# Patient Record
Sex: Female | Born: 1945 | Race: White | Hispanic: No | Smoking: Never smoker
Health system: Southern US, Community
[De-identification: ages and names within clinical notes are randomized; demographics above are authoritative.]

## PROBLEM LIST (undated history)

## (undated) DIAGNOSIS — E785 Hyperlipidemia, unspecified: Secondary | ICD-10-CM

## (undated) DIAGNOSIS — K219 Gastro-esophageal reflux disease without esophagitis: Secondary | ICD-10-CM

## (undated) DIAGNOSIS — F419 Anxiety disorder, unspecified: Secondary | ICD-10-CM

## (undated) DIAGNOSIS — R002 Palpitations: Secondary | ICD-10-CM

## (undated) DIAGNOSIS — F329 Major depressive disorder, single episode, unspecified: Secondary | ICD-10-CM

## (undated) DIAGNOSIS — R531 Weakness: Secondary | ICD-10-CM

## (undated) DIAGNOSIS — R7303 Prediabetes: Secondary | ICD-10-CM

## (undated) DIAGNOSIS — F4321 Adjustment disorder with depressed mood: Secondary | ICD-10-CM

## (undated) DIAGNOSIS — F4320 Adjustment disorder, unspecified: Secondary | ICD-10-CM

## (undated) DIAGNOSIS — E039 Hypothyroidism, unspecified: Secondary | ICD-10-CM

## (undated) DIAGNOSIS — Z87442 Personal history of urinary calculi: Secondary | ICD-10-CM

## (undated) DIAGNOSIS — I872 Venous insufficiency (chronic) (peripheral): Secondary | ICD-10-CM

## (undated) DIAGNOSIS — C4492 Squamous cell carcinoma of skin, unspecified: Secondary | ICD-10-CM

## (undated) DIAGNOSIS — R7989 Other specified abnormal findings of blood chemistry: Secondary | ICD-10-CM

## (undated) HISTORY — PX: MASS EXCISION: SHX2000

## (undated) HISTORY — DX: Hyperlipidemia, unspecified: E78.5

## (undated) HISTORY — DX: Prediabetes: R73.03

## (undated) HISTORY — DX: Anxiety disorder, unspecified: F41.9

## (undated) HISTORY — DX: Major depressive disorder, single episode, unspecified: F32.9

## (undated) HISTORY — DX: Palpitations: R00.2

## (undated) HISTORY — DX: Venous insufficiency (chronic) (peripheral): I87.2

## (undated) HISTORY — PX: WISDOM TOOTH EXTRACTION: SHX21

## (undated) HISTORY — PX: COLONOSCOPY: SHX174

## (undated) HISTORY — DX: Weakness: R53.1

## (undated) HISTORY — DX: Adjustment disorder with depressed mood: F43.21

## (undated) HISTORY — DX: Hypothyroidism, unspecified: E03.9

## (undated) HISTORY — DX: Gastro-esophageal reflux disease without esophagitis: K21.9

## (undated) HISTORY — DX: Personal history of urinary calculi: Z87.442

## (undated) HISTORY — DX: Adjustment disorder, unspecified: F43.20

## (undated) HISTORY — DX: Squamous cell carcinoma of skin, unspecified: C44.92

## (undated) HISTORY — DX: Other specified abnormal findings of blood chemistry: R79.89

---

## 1999-12-08 ENCOUNTER — Other Ambulatory Visit: Admission: RE | Admit: 1999-12-08 | Discharge: 1999-12-08 | Payer: Self-pay | Admitting: Obstetrics and Gynecology

## 1999-12-30 ENCOUNTER — Ambulatory Visit (HOSPITAL_COMMUNITY): Admission: RE | Admit: 1999-12-30 | Discharge: 1999-12-30 | Payer: Self-pay | Admitting: Internal Medicine

## 1999-12-30 ENCOUNTER — Encounter: Payer: Self-pay | Admitting: Internal Medicine

## 2000-11-25 ENCOUNTER — Encounter: Payer: Self-pay | Admitting: Emergency Medicine

## 2000-11-25 ENCOUNTER — Emergency Department (HOSPITAL_COMMUNITY): Admission: EM | Admit: 2000-11-25 | Discharge: 2000-11-25 | Payer: Self-pay

## 2000-12-13 ENCOUNTER — Other Ambulatory Visit: Admission: RE | Admit: 2000-12-13 | Discharge: 2000-12-13 | Payer: Self-pay | Admitting: *Deleted

## 2001-12-17 ENCOUNTER — Other Ambulatory Visit: Admission: RE | Admit: 2001-12-17 | Discharge: 2001-12-17 | Payer: Self-pay | Admitting: Obstetrics and Gynecology

## 2002-05-31 ENCOUNTER — Inpatient Hospital Stay (HOSPITAL_COMMUNITY): Admission: AD | Admit: 2002-05-31 | Discharge: 2002-06-03 | Payer: Self-pay | Admitting: Internal Medicine

## 2002-09-12 ENCOUNTER — Ambulatory Visit (HOSPITAL_BASED_OUTPATIENT_CLINIC_OR_DEPARTMENT_OTHER): Admission: RE | Admit: 2002-09-12 | Discharge: 2002-09-12 | Payer: Self-pay | Admitting: General Surgery

## 2002-09-12 ENCOUNTER — Encounter (INDEPENDENT_AMBULATORY_CARE_PROVIDER_SITE_OTHER): Payer: Self-pay | Admitting: *Deleted

## 2003-02-12 ENCOUNTER — Other Ambulatory Visit: Admission: RE | Admit: 2003-02-12 | Discharge: 2003-02-12 | Payer: Self-pay | Admitting: Obstetrics and Gynecology

## 2004-11-19 ENCOUNTER — Ambulatory Visit: Payer: Self-pay | Admitting: Gastroenterology

## 2004-11-26 ENCOUNTER — Ambulatory Visit (HOSPITAL_COMMUNITY): Admission: RE | Admit: 2004-11-26 | Discharge: 2004-11-26 | Payer: Self-pay | Admitting: Gastroenterology

## 2004-12-28 ENCOUNTER — Ambulatory Visit: Payer: Self-pay | Admitting: Pulmonary Disease

## 2006-10-09 ENCOUNTER — Ambulatory Visit: Payer: Self-pay | Admitting: Pulmonary Disease

## 2006-10-11 LAB — CONVERTED CEMR LAB
Basophils Relative: 0.6 % (ref 0.0–1.0)
CO2: 30 meq/L (ref 19–32)
Calcium: 9.7 mg/dL (ref 8.4–10.5)
Chloride: 103 meq/L (ref 96–112)
Cholesterol: 226 mg/dL (ref 0–200)
GFR calc Af Amer: 73 mL/min
Glucose, Bld: 111 mg/dL — ABNORMAL HIGH (ref 70–99)
Hemoglobin: 13.3 g/dL (ref 12.0–15.0)
Lymphocytes Relative: 40.6 % (ref 12.0–46.0)
MCV: 88.2 fL (ref 78.0–100.0)
Monocytes Absolute: 0.5 10*3/uL (ref 0.2–0.7)
Monocytes Relative: 7.6 % (ref 3.0–11.0)
Neutrophils Relative %: 49.4 % (ref 43.0–77.0)
Platelets: 264 10*3/uL (ref 150–400)
TSH: 4.41 microintl units/mL (ref 0.35–5.50)
Total CHOL/HDL Ratio: 4.5
Triglycerides: 157 mg/dL — ABNORMAL HIGH (ref 0–149)
WBC: 6.4 10*3/uL (ref 4.5–10.5)

## 2007-01-09 ENCOUNTER — Ambulatory Visit: Payer: Self-pay | Admitting: Pulmonary Disease

## 2007-01-09 LAB — CONVERTED CEMR LAB
ALT: 37 units/L (ref 0–40)
AST: 30 units/L (ref 0–37)
Albumin: 3.4 g/dL — ABNORMAL LOW (ref 3.5–5.2)
Alkaline Phosphatase: 82 units/L (ref 39–117)
BUN: 15 mg/dL (ref 6–23)
CO2: 31 meq/L (ref 19–32)
Chloride: 105 meq/L (ref 96–112)
Creatinine, Ser: 0.8 mg/dL (ref 0.4–1.2)
Potassium: 4.2 meq/L (ref 3.5–5.1)
Total Bilirubin: 0.5 mg/dL (ref 0.3–1.2)
Total Protein: 6.7 g/dL (ref 6.0–8.3)

## 2007-12-03 ENCOUNTER — Ambulatory Visit: Payer: Self-pay | Admitting: Internal Medicine

## 2007-12-03 DIAGNOSIS — E1169 Type 2 diabetes mellitus with other specified complication: Secondary | ICD-10-CM | POA: Insufficient documentation

## 2007-12-03 DIAGNOSIS — E785 Hyperlipidemia, unspecified: Secondary | ICD-10-CM | POA: Insufficient documentation

## 2007-12-03 DIAGNOSIS — R7309 Other abnormal glucose: Secondary | ICD-10-CM

## 2007-12-03 DIAGNOSIS — Z87442 Personal history of urinary calculi: Secondary | ICD-10-CM

## 2007-12-05 ENCOUNTER — Ambulatory Visit: Payer: Self-pay | Admitting: Internal Medicine

## 2007-12-10 LAB — CONVERTED CEMR LAB
ALT: 35 units/L (ref 0–35)
Alkaline Phosphatase: 72 units/L (ref 39–117)
BUN: 14 mg/dL (ref 6–23)
Basophils Relative: 0.9 % (ref 0.0–1.0)
Bilirubin, Direct: 0.1 mg/dL (ref 0.0–0.3)
CO2: 31 meq/L (ref 19–32)
CRP, High Sensitivity: 5 (ref 0.00–5.00)
Creatinine, Ser: 0.8 mg/dL (ref 0.4–1.2)
Eosinophils Relative: 1.7 % (ref 0.0–5.0)
GFR calc Af Amer: 94 mL/min
Glucose, Bld: 123 mg/dL — ABNORMAL HIGH (ref 70–99)
HCT: 37.9 % (ref 36.0–46.0)
HDL: 50.2 mg/dL (ref >39.0)
Hemoglobin: 12.6 g/dL (ref 12.0–15.0)
LDL Cholesterol: 131 mg/dL — ABNORMAL HIGH (ref 0–99)
Lymphocytes Relative: 38.6 % (ref 12.0–46.0)
Monocytes Absolute: 0.5 10*3/uL (ref 0.2–0.7)
Neutro Abs: 3.4 10*3/uL (ref 1.4–7.7)
Neutrophils Relative %: 51.6 % (ref 43.0–77.0)
Potassium: 4.8 meq/L (ref 3.5–5.1)
RDW: 12.3 % (ref 11.5–14.6)
TSH: 4.48 microintl units/mL (ref 0.35–5.50)
Total Bilirubin: 0.7 mg/dL (ref 0.3–1.2)
Total Protein: 6.9 g/dL (ref 6.0–8.3)
VLDL: 15 mg/dL (ref 0–40)
WBC: 6.6 10*3/uL (ref 4.5–10.5)

## 2007-12-13 ENCOUNTER — Ambulatory Visit: Payer: Self-pay | Admitting: Internal Medicine

## 2008-01-01 ENCOUNTER — Encounter: Payer: Self-pay | Admitting: Internal Medicine

## 2008-01-07 ENCOUNTER — Telehealth: Payer: Self-pay | Admitting: Internal Medicine

## 2008-01-14 ENCOUNTER — Ambulatory Visit: Payer: Self-pay | Admitting: Internal Medicine

## 2008-01-14 DIAGNOSIS — L259 Unspecified contact dermatitis, unspecified cause: Secondary | ICD-10-CM

## 2008-01-14 LAB — CONVERTED CEMR LAB
Alkaline Phosphatase: 69 units/L (ref 39–117)
Basophils Absolute: 0 10*3/uL (ref 0.0–0.1)
Bilirubin, Direct: 0.1 mg/dL (ref 0.0–0.3)
Calcium: 8.6 mg/dL (ref 8.4–10.5)
GFR calc Af Amer: 94 mL/min
GFR calc non Af Amer: 78 mL/min
HCT: 37.1 % (ref 36.0–46.0)
Hemoglobin: 12.9 g/dL (ref 12.0–15.0)
MCHC: 34.8 g/dL (ref 30.0–36.0)
Monocytes Absolute: 0.4 10*3/uL (ref 0.1–1.0)
Monocytes Relative: 8 % (ref 3.0–12.0)
Neutro Abs: 2.9 10*3/uL (ref 1.4–7.7)
Platelets: 204 10*3/uL (ref 150–400)
Potassium: 3.7 meq/L (ref 3.5–5.1)
RDW: 12.1 % (ref 11.5–14.6)
Sodium: 139 meq/L (ref 135–145)
Total Bilirubin: 0.7 mg/dL (ref 0.3–1.2)

## 2008-01-15 ENCOUNTER — Emergency Department (HOSPITAL_COMMUNITY): Admission: EM | Admit: 2008-01-15 | Discharge: 2008-01-15 | Payer: Self-pay | Admitting: Emergency Medicine

## 2008-01-23 ENCOUNTER — Encounter: Payer: Self-pay | Admitting: Internal Medicine

## 2008-02-06 ENCOUNTER — Ambulatory Visit: Payer: Self-pay | Admitting: Internal Medicine

## 2008-02-06 LAB — CONVERTED CEMR LAB
ALT: 32 units/L (ref 0–35)
AST: 28 units/L (ref 0–37)
HDL: 47.3 mg/dL (ref >39.0)
Total CHOL/HDL Ratio: 4.2
Triglycerides: 72 mg/dL (ref 0–149)
VLDL: 14 mg/dL (ref 0–40)

## 2008-02-11 ENCOUNTER — Ambulatory Visit: Payer: Self-pay | Admitting: Internal Medicine

## 2008-06-04 ENCOUNTER — Ambulatory Visit: Payer: Self-pay | Admitting: Internal Medicine

## 2008-06-04 LAB — CONVERTED CEMR LAB
Creatinine,U: 72.5 mg/dL
LDL Cholesterol: 87 mg/dL (ref 0–99)
Microalb Creat Ratio: 2.8 mg/g (ref 0.0–30.0)
Microalb, Ur: 0.2 mg/dL (ref 0.0–1.9)
Total CHOL/HDL Ratio: 2.9
Triglycerides: 60 mg/dL (ref 0–149)

## 2008-06-16 ENCOUNTER — Ambulatory Visit: Payer: Self-pay | Admitting: Internal Medicine

## 2008-07-17 ENCOUNTER — Ambulatory Visit: Payer: Self-pay | Admitting: Internal Medicine

## 2008-07-17 DIAGNOSIS — J069 Acute upper respiratory infection, unspecified: Secondary | ICD-10-CM | POA: Insufficient documentation

## 2008-07-17 DIAGNOSIS — R070 Pain in throat: Secondary | ICD-10-CM | POA: Insufficient documentation

## 2008-07-17 LAB — CONVERTED CEMR LAB: Rapid Strep: NEGATIVE

## 2008-07-21 ENCOUNTER — Ambulatory Visit: Payer: Self-pay | Admitting: Gastroenterology

## 2008-11-20 ENCOUNTER — Emergency Department (HOSPITAL_COMMUNITY): Admission: EM | Admit: 2008-11-20 | Discharge: 2008-11-20 | Payer: Self-pay | Admitting: Emergency Medicine

## 2008-11-20 ENCOUNTER — Ambulatory Visit: Payer: Self-pay | Admitting: Cardiology

## 2008-11-26 ENCOUNTER — Ambulatory Visit: Payer: Self-pay | Admitting: Internal Medicine

## 2008-11-26 LAB — CONVERTED CEMR LAB: Total CK: 48 units/L (ref 7–177)

## 2008-11-27 ENCOUNTER — Encounter: Payer: Self-pay | Admitting: Internal Medicine

## 2008-11-27 ENCOUNTER — Ambulatory Visit: Payer: Self-pay

## 2008-12-08 ENCOUNTER — Ambulatory Visit: Payer: Self-pay | Admitting: Internal Medicine

## 2008-12-08 DIAGNOSIS — R5383 Other fatigue: Secondary | ICD-10-CM

## 2008-12-08 DIAGNOSIS — R5381 Other malaise: Secondary | ICD-10-CM

## 2008-12-08 DIAGNOSIS — F4321 Adjustment disorder with depressed mood: Secondary | ICD-10-CM

## 2008-12-11 ENCOUNTER — Encounter: Payer: Self-pay | Admitting: Internal Medicine

## 2008-12-19 ENCOUNTER — Ambulatory Visit: Payer: Self-pay | Admitting: Internal Medicine

## 2008-12-19 LAB — CONVERTED CEMR LAB: Fecal Occult Bld: POSITIVE

## 2008-12-20 ENCOUNTER — Telehealth: Payer: Self-pay | Admitting: Internal Medicine

## 2009-01-07 ENCOUNTER — Encounter: Payer: Self-pay | Admitting: Internal Medicine

## 2009-01-08 ENCOUNTER — Encounter: Payer: Self-pay | Admitting: Internal Medicine

## 2009-01-17 DIAGNOSIS — F411 Generalized anxiety disorder: Secondary | ICD-10-CM | POA: Insufficient documentation

## 2009-01-17 DIAGNOSIS — R0602 Shortness of breath: Secondary | ICD-10-CM

## 2009-01-17 DIAGNOSIS — R002 Palpitations: Secondary | ICD-10-CM | POA: Insufficient documentation

## 2009-01-17 DIAGNOSIS — K219 Gastro-esophageal reflux disease without esophagitis: Secondary | ICD-10-CM | POA: Insufficient documentation

## 2009-01-17 DIAGNOSIS — R079 Chest pain, unspecified: Secondary | ICD-10-CM

## 2009-01-23 ENCOUNTER — Ambulatory Visit: Payer: Self-pay | Admitting: Internal Medicine

## 2009-01-26 ENCOUNTER — Ambulatory Visit: Payer: Self-pay | Admitting: Cardiology

## 2009-03-09 ENCOUNTER — Ambulatory Visit: Payer: Self-pay | Admitting: Internal Medicine

## 2009-03-09 LAB — CONVERTED CEMR LAB
ALT: 28 units/L (ref 0–35)
Albumin: 3.5 g/dL (ref 3.5–5.2)
Alkaline Phosphatase: 61 units/L (ref 39–117)
Bilirubin, Direct: 0.1 mg/dL (ref 0.0–0.3)
Cholesterol: 205 mg/dL — ABNORMAL HIGH (ref 0–200)
Direct LDL: 133.5 mg/dL
HDL: 66.6 mg/dL (ref >39.00)
Total Protein: 6.7 g/dL (ref 6.0–8.3)

## 2009-03-16 ENCOUNTER — Ambulatory Visit: Payer: Self-pay | Admitting: Internal Medicine

## 2009-03-23 ENCOUNTER — Ambulatory Visit: Payer: Self-pay | Admitting: Internal Medicine

## 2009-03-23 LAB — HM COLONOSCOPY

## 2009-03-26 ENCOUNTER — Encounter: Payer: Self-pay | Admitting: Internal Medicine

## 2009-06-02 ENCOUNTER — Telehealth: Payer: Self-pay | Admitting: Internal Medicine

## 2009-06-04 ENCOUNTER — Encounter: Payer: Self-pay | Admitting: Internal Medicine

## 2009-06-30 ENCOUNTER — Telehealth: Payer: Self-pay | Admitting: Internal Medicine

## 2009-07-01 ENCOUNTER — Ambulatory Visit: Payer: Self-pay | Admitting: Internal Medicine

## 2009-07-01 LAB — CONVERTED CEMR LAB
CO2: 31 meq/L (ref 19–32)
Calcium: 9.6 mg/dL (ref 8.4–10.5)
Creatinine, Ser: 0.8 mg/dL (ref 0.4–1.2)
Glucose, Bld: 105 mg/dL — ABNORMAL HIGH (ref 70–99)

## 2009-07-06 ENCOUNTER — Ambulatory Visit: Payer: Self-pay | Admitting: Internal Medicine

## 2009-09-07 ENCOUNTER — Telehealth: Payer: Self-pay | Admitting: Internal Medicine

## 2009-09-28 ENCOUNTER — Telehealth (INDEPENDENT_AMBULATORY_CARE_PROVIDER_SITE_OTHER): Payer: Self-pay | Admitting: *Deleted

## 2009-10-02 ENCOUNTER — Telehealth: Payer: Self-pay | Admitting: Internal Medicine

## 2009-12-23 ENCOUNTER — Encounter: Payer: Self-pay | Admitting: Internal Medicine

## 2010-01-19 ENCOUNTER — Encounter: Payer: Self-pay | Admitting: Internal Medicine

## 2010-02-08 ENCOUNTER — Ambulatory Visit: Payer: Self-pay | Admitting: Internal Medicine

## 2010-02-08 LAB — CONVERTED CEMR LAB
Albumin: 3.7 g/dL (ref 3.5–5.2)
BUN: 22 mg/dL (ref 6–23)
Calcium: 8.9 mg/dL (ref 8.4–10.5)
Cholesterol: 225 mg/dL — ABNORMAL HIGH (ref 0–200)
GFR calc non Af Amer: 70.04 mL/min (ref >60)
Glucose, Bld: 101 mg/dL — ABNORMAL HIGH (ref 70–99)
HDL: 68 mg/dL (ref >39.00)
Triglycerides: 97 mg/dL (ref 0.0–149.0)
VLDL: 19.4 mg/dL (ref 0.0–40.0)

## 2010-02-15 ENCOUNTER — Ambulatory Visit: Payer: Self-pay | Admitting: Internal Medicine

## 2010-02-15 DIAGNOSIS — R946 Abnormal results of thyroid function studies: Secondary | ICD-10-CM | POA: Insufficient documentation

## 2010-07-28 ENCOUNTER — Encounter: Payer: Self-pay | Admitting: Internal Medicine

## 2010-08-17 LAB — CONVERTED CEMR LAB: Pap Smear: NORMAL

## 2010-08-18 ENCOUNTER — Ambulatory Visit: Payer: Self-pay | Admitting: Internal Medicine

## 2010-08-18 LAB — CONVERTED CEMR LAB
BUN: 20 mg/dL (ref 6–23)
CO2: 33 meq/L — ABNORMAL HIGH (ref 19–32)
Chloride: 103 meq/L (ref 96–112)
Creatinine, Ser: 0.9 mg/dL (ref 0.4–1.2)
Hgb A1c MFr Bld: 5.7 % (ref 4.6–6.5)

## 2010-08-30 ENCOUNTER — Ambulatory Visit: Payer: Self-pay | Admitting: Internal Medicine

## 2010-09-06 ENCOUNTER — Telehealth: Payer: Self-pay | Admitting: Internal Medicine

## 2010-10-17 ENCOUNTER — Encounter: Payer: Self-pay | Admitting: Pulmonary Disease

## 2010-10-26 NOTE — Letter (Signed)
Summary: [facility]  [facility]   Imported By: Lanelle Bal 04/15/2010 07:51:41  _____________________________________________________________________  External Attachment:    Type:   Image     Comment:   External Document

## 2010-10-26 NOTE — Progress Notes (Signed)
Summary: Pravastatin Refill  Phone Note Refill Request Message from:  Fax from Pharmacy on October 02, 2009 11:24 AM  Refills Requested: Medication #1:  PRAVASTATIN SODIUM 40 MG TABS one by mouth qpm   Dosage confirmed as above?Dosage Confirmed   Brand Name Necessary? No   Supply Requested: 1 month   Last Refilled: 08/30/2009  Method Requested: Electronic Next Appointment Scheduled: 12-30-09 lab  Initial call taken by: Roselle Locus,  October 02, 2009 11:24 AM  Follow-up for Phone Call        Rx completed in Dr. Tiajuana Amass Follow-up by: Glendell Docker CMA,  October 02, 2009 12:49 PM    Prescriptions: PRAVASTATIN SODIUM 40 MG TABS (PRAVASTATIN SODIUM) one by mouth qpm  #30 x 3   Entered by:   Glendell Docker CMA   Authorized by:   D. Thomos Lemons DO   Signed by:   Glendell Docker CMA on 10/02/2009   Method used:   Electronically to        [facility] [address] (retail)       [address], [address]       [place], [place]  [address]       Ph: [phone]       Fax: [phone]   RxID:   [id number]

## 2010-10-26 NOTE — Progress Notes (Signed)
Summary: TALK TO DARLENE ABOUT CHECK OUT SHEET  Phone Note Call from Patient Call back at Work Phone [phone]   Caller: PT LIVE Call For: yOO  Summary of Call: SHE WANTS TO TALK ABOUT HER CHECK OUT SHEET SHE GOT ON 3-15.   Initial call taken by: Roselle Locus,  September 28, 2009 12:48 PM  Follow-up for Phone Call        call returned to patient per request. She was concerned regarding the print out that she received in March. She thought the information for protocols was a part of her record, discussing of obesity, alcohol and depression. It was explained to her at the time of her request the information that she needed for the dates of mammogram,pap, shots...and preventative screening the only way at the time to provide the information was to print out a protocol sheet. The information regarding depression, obesity and alcohol was not a part of her permanent record. Patients states she had a conversation with the recptionist Marg regarding the document, and she made it sound like it was going to be a part of her record. Patient was reassured that the information was a screening tool for the physician. Patient verbalized understanding and agrees to disregard information  Follow-up by: Glendell Docker CMA,  October 01, 2009 12:14 PM

## 2010-10-26 NOTE — Assessment & Plan Note (Signed)
Summary: FOLLOW UP/HEA   Vital Signs:  Patient profile:   65 year old female Height:      68 inches Weight:      248.25 pounds BMI:     37.88 O2 Sat:      98 % on Room air Temp:     98.8 degrees F oral Pulse rate:   77 / minute Pulse rhythm:   regular Resp:     18 per minute  Vitals Entered By: Glendell Docker CMA (Feb 15, 2010 3:38 PM)  O2 Flow:  Room air CC: Rm 3- Follow up disease management   Primary Care Provider:  DThomos Lemons DO  CC:  Rm 3- Follow up disease management.  History of Present Illness: 64 y/o white female with hx of abnormal glucose and hyperlipidemia for f/u  Interval hx: seen by vein specialist.  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  normal bone density seen by podiatrist re:  left achilles pain.  diagnosed with achilles tendinitis. took diclofenac x 1 month.   no improvement.  retired from med records dept at [employer]  Abnormal glucose - fair dietary compliance.  using metformin.  wt up and down.  hyperlipidemia - she reports taking pravastatin.  fair dietary compliance reviewed labs.   TSH is borderline chronic fatigue and some hair loss adjustment d/o with losing both parents.  tries to stay active   reviewed bone density scan - normal  Allergies: 1)  Lipitor  Past History:  Past Medical History: Current Problems:  PALPITATIONS (ICD-785.1)  CHEST PAIN (ICD-786.50) HYPERLIPIDEMIA (ICD-272.4) DIABETES MELLITUS (ICD-250.00) SHORTNESS OF BREATH (ICD-786.05) ANXIETY (ICD-300.00)   GERD (ICD-530.81) HEMOCCULT POSITIVE STOOL (ICD-578.1) WEAKNESS (ICD-780.79) GRIEF REACTION (ICD-309.0) URI (ICD-465.9) THROAT PAIN (ICD-784.1) DERMATITIS (ICD-692.9)  DIARRHEA OF PRESUMED INFECTIOUS ORIGIN (ICD-009.3) ABDOMINAL PAIN (ICD-789.00) HEALTH MAINTENANCE EXAM (ICD-V70.0) OTHER ABNORMAL GLUCOSE (ICD-790.29)  NEPHROLITHIASIS, HX OF  (ICD-V13.01)  Past Surgical History: Right upper inner thigh cyst or mass.      PMH-FH-SH reviewed-no changes except otherwise noted  Family History: Mother has COPD: deceased 29-Oct-2008 Father has alzheimers and history of CAD (S/P CABG) - he died 08/29/09        Social History: Retired Presenter, broadcasting for [employer] Married, no children Never Smoked  Alcohol use-no    Daily Caffeine Use 2/day  Review of Systems  The patient denies weight loss, weight gain, chest pain, and depression.    Physical Exam  General:   alert, well-developed, and well-nourished.   Head:  normocephalic and atraumatic.   Neck:  supple with no bruits Lungs:  normal respiratory effort and normal breath sounds.   Heart:  normal rate, regular rhythm, no murmur, and no gallop.   Abdomen:  soft, non-tender, normal bowel sounds, no hepatomegaly, and no splenomegaly.   Extremities:  No lower extremity edema  Neurologic:  cranial nerves II-XII intact and gait normal.   Skin:  healed surgical scar on bridge of nose Psych:  normally interactive and good eye contact.     Impression & Recommendations:  Problem # 1:  DIABETES MELLITUS, TYPE II, BORDERLINE (ICD-790.29) A1c stable at 5.8.  continue diet and exercise.  next visit - consider DC metformin  Her updated medication list for this problem includes:    Glucophage Xr 500 Mg Xr24h-tab (Metformin hcl) .Marland Kitchen... Take 1 tablet by mouth two times a day  Labs Reviewed: Creat: 0.9 (02/08/2010)     Problem # 2:  HYPERLIPIDEMIA (ICD-272.4) stable.  continue pravastatin.  reassured pt re:  potential for liver side effects.  TSH is borderline.  check thyroid antibodies.    Her updated medication list for this problem includes:    Pravastatin Sodium 40 Mg Tabs (Pravastatin sodium) ..... One by mouth qpm  Labs Reviewed: SGOT: 23 (02/08/2010)   SGPT: 23 (02/08/2010)   HDL:68.00 (02/08/2010), 66.60 (03/09/2009)  LDL:87 (06/04/2008), DEL (16/06/9603)   Chol:225 (02/08/2010), 205 (03/09/2009)  Trig:97.0 (02/08/2010), 86.0 (03/09/2009)  Complete Medication List: 1)  Prilosec Otc 20 Mg Tbec (Omeprazole magnesium) .... Take 1 tablet by mouth once a day 2)  Low-dose Aspirin 81 Mg Tabs (Aspirin) .... Take 1 tablet by mouth once a day 3)  Centrum Tabs (Multiple vitamins-minerals) .... Take 1 tablet by mouth once a day 4)  Glucophage Xr 500 Mg Xr24h-tab (Metformin hcl) .... Take 1 tablet by mouth two times a day 5)  Pravastatin Sodium 40 Mg Tabs (Pravastatin sodium) .... One by mouth qpm 6)  Calcium Plus Vitamin D 300-100 Mg-unit Caps (Calcium carbonate-vitamin d) .... Take 1 capsule by mouth once a day 7)  Fish Oil 1000 Mg Caps (Omega-3 fatty acids) .... Take 1 capsule by mouth three times a day 8)  Coq-10 200 Mg Caps (Coenzyme q10) .... Take 1 capsule by mouth once a day  Patient Instructions: 1)  Please schedule a follow-up appointment in 6 months. 2)  BMP prior to visit, ICD-9: 790.29 3)  HbgA1C prior to visit, ICD-9: 790.29 4)  TSH prior to visit, ICD-9: 794.5 5)  Thyroid antibodies:   794.5 6)  Please return for lab work one (1) week before your next appointment.    Preventive Care Screening  Mammogram:    Date:  01/19/2010    Results:  normal   Current Allergies (reviewed today): LIPITOR

## 2010-10-28 NOTE — Progress Notes (Signed)
Summary: Metformin/Pravastatin Refill  Phone Note Call from Patient Call back at Home Phone [phone]   Caller: Patient Call For: D. Thomos Lemons DO Summary of Call: patient called and left a voice message stating she would like to continue taking Metformin until  she sees Dr Artist Pais on her next visit.  She is also requesting a Refill on Pravastatin. Initial call taken by: Glendell Docker CMA,  September 06, 2010 9:56 AM  Follow-up for Phone Call        call returned to patient at [phone], she has been advised of medications to pharmacy Follow-up by: Glendell Docker CMA,  September 06, 2010 12:02 PM    New/Updated Medications: PRAVASTATIN SODIUM 40 MG TABS (PRAVASTATIN SODIUM) one by mouth qpm METFORMIN HCL 500 MG XR24H-TAB (METFORMIN HCL) one by mouth two times a day Prescriptions: PRAVASTATIN SODIUM 40 MG TABS (PRAVASTATIN SODIUM) one by mouth qpm  #90 x 3   Entered and Authorized by:   D. Thomos Lemons DO   Signed by:   D. Thomos Lemons DO on 09/06/2010   Method used:   Electronically to        [facility] [address] (retail)       [address], [address]       [place], [place]  [address]       Ph: [phone]       Fax: [phone]   RxID:   [id number] METFORMIN HCL 500 MG XR24H-TAB (METFORMIN HCL) one by mouth two times a day  #60 x 5   Entered and Authorized by:   D. Thomos Lemons DO   Signed by:   D. Thomos Lemons DO on 09/06/2010   Method used:   Electronically to        [facility] [address] ([facility]ail)       [address], [address]       [place], [place]  [address]       Ph: [phone]       Fax: [phone]   RxID:   [id number]

## 2010-10-28 NOTE — Assessment & Plan Note (Signed)
Summary: 6 MONTH F/U-DK   Vital Signs:  Patient profile:   65 year old female Height:      68 inches Weight:      241.75 pounds BMI:     36.89 O2 Sat:      99 % on Room air Temp:     98.3 degrees F oral Pulse rate:   66 / minute Resp:     18 per minute BP sitting:   122 / 80  (right arm) Cuff size:   large  Vitals Entered By: Glendell Docker CMA (August 30, 2010 3:25 PM)  O2 Flow:  Room air CC: 6 month follow up, Type 2 diabetes mellitus borderline follow-up Is Patient Diabetic? No Pain Assessment Patient in pain? no        Primary Care Simra Fiebig:  Dondra Spry DO  CC:  6 month follow up and Type 2 diabetes mellitus borderline follow-up.  History of Present Illness:  Type 2 Diabetes Mellitus Follow-Up      This is a 65 year old woman who presents for Type 2 diabetes mellitus borderline follow-up.  The patient denies weight gain.    hyperlipidemia - stable  [place]  holidays are tough time of yr with both her parents deceased    Preventive Screening-Counseling & Management  Alcohol-Tobacco     Smoking Status: never  Allergies: 1)  Lipitor  Past History:  Past Medical History: Current Problems:  PALPITATIONS (ICD-785.1)  CHEST PAIN (ICD-786.50) HYPERLIPIDEMIA (ICD-272.4) Borderline DIABETES MELLITUS 790.29  SHORTNESS OF BREATH (ICD-786.05) ANXIETY (ICD-300.00)   GERD (ICD-530.81) HEMOCCULT POSITIVE STOOL (ICD-578.1) WEAKNESS (ICD-780.79) GRIEF REACTION (ICD-309.0) URI (ICD-465.9) THROAT PAIN (ICD-784.1) DERMATITIS (ICD-692.9)  DIARRHEA OF PRESUMED INFECTIOUS ORIGIN (ICD-009.3) ABDOMINAL PAIN (ICD-789.00) HEALTH MAINTENANCE EXAM (ICD-V70.0) OTHER ABNORMAL GLUCOSE (ICD-790.29)  NEPHROLITHIASIS, HX OF (ICD-V13.01) Squamous cell - bridge of nose (Dr Danella Deis)  Social History: Retired Presenter, 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   Neck:  supple with no bruits Lungs:  normal respiratory effort and normal breath sounds.   Heart:  normal rate, regular rhythm, no murmur, and no gallop.   Extremities:  No lower extremity edema    Impression & Recommendations:  Problem # 1:  DIABETES MELLITUS, TYPE II, BORDERLINE (ICD-790.29) Assessment Improved pt able to exercise regularly since retiring.  pt would like to stop metformin if possible  The following medications were removed from the medication list:    Glucophage Xr 500 Mg Xr24h-tab (Metformin hcl) .Marland Kitchen... Take 1 tablet by mouth two times a day Her updated medication list for this problem includes:    Metformin Hcl 500 Mg Xr24h-tab (Metformin hcl) ..... One by mouth two times a day  Labs Reviewed: Creat: 0.9 (08/18/2010)     Problem # 2:  THYROID FUNCTION TEST, ABNORMAL (ICD-794.5) mild elevation in TSH  sub clinical hypothyroidism start low dose synthroid  Problem # 3:  HYPERLIPIDEMIA (ICD-272.4) monitor LFTs Her updated medication list for this problem includes:    Pravastatin Sodium 40 Mg Tabs (Pravastatin sodium) ..... One by mouth qpm  Labs Reviewed: SGOT: 23 (02/08/2010)   SGPT: 23 (02/08/2010)   HDL:68.00 (02/08/2010), 66.60 (03/09/2009)  LDL:87 (06/04/2008), DEL (16/06/9603)  Chol:225 (02/08/2010), 205 (03/09/2009)  Trig:97.0 (02/08/2010), 86.0 (03/09/2009)  Complete Medication List: 1)  Prilosec Otc 20 Mg Tbec (Omeprazole magnesium) .Marland KitchenMarland KitchenMarland Kitchen  Take 1 tablet by mouth once a day 2)  Low-dose Aspirin 81 Mg Tabs (Aspirin) .... Take 1 tablet by mouth once a day 3)  Centrum Tabs (Multiple vitamins-minerals) .... Take 1 tablet by mouth once a day 4)  Pravastatin Sodium 40 Mg Tabs (Pravastatin sodium) .... One by mouth qpm 5)  Calcium Plus Vitamin D 300-100 Mg-unit Caps (Calcium carbonate-vitamin d) .... Take 1 capsule by mouth once a day 6)  Fish Oil 1000 Mg Caps  (Omega-3 fatty acids) .... Take 1 capsule by mouth three times a day 7)  Appetite Suppressant  .... Take 1 tablet by mouth two times a day 8)  Levothyroxine Sodium 25 Mcg Tabs (Levothyroxine sodium) .... One by mouth once daily 9)  Metformin Hcl 500 Mg Xr24h-tab (Metformin hcl) .... One by mouth two times a day  Patient Instructions: 1)  Please schedule a follow-up appointment in 6 months 2)  BMP prior to visit, ICD-9:  790.29 3)  Hepatic Panel prior to visit, ICD-9: 272.4 4)  Lipid Panel prior to visit, ICD-9: 272.4 5)  High sensitivity CRP :  272.4 6)  HbgA1C prior to visit, ICD-9:  790.29 7)  Please return for lab work one (1) week before your next appointment.  Prescriptions: LEVOTHYROXINE SODIUM 25 MCG TABS (LEVOTHYROXINE SODIUM) one by mouth once daily  #30 x 3   Entered and Authorized by:   D. Thomos Lemons DO   Signed by:   D. Thomos Lemons DO on 08/30/2010   Method used:   Electronically to        [facility] [address] ([facility]ail)       [address], [address]       [place], [place]  [address]       Ph: [phone]       Fax: [phone]   RxID:   [id number]    Orders Added: 1)  Est. Patient Level III [536[address]]   Immunization History:  Influenza Immunization History:    Influenza:  declined (08/30/2010)   Contraindications/Deferment of Procedures/Staging:    Test/Procedure: FLU VAX    Reason for deferment: patient declined   Immunization History:  Influenza Immunization History:    Influenza:  Declined (08/30/2010)  Current Allergies (reviewed today): LIPITOR   Preventive Care Screening  Pap Smear:    Date:  08/17/2010    Results:  normal   Bone Density:    Date:  06/09/2009    Results:  normal std dev

## 2010-11-08 ENCOUNTER — Other Ambulatory Visit: Payer: Self-pay

## 2010-12-02 ENCOUNTER — Other Ambulatory Visit: Payer: Self-pay | Admitting: Internal Medicine

## 2010-12-02 ENCOUNTER — Encounter (INDEPENDENT_AMBULATORY_CARE_PROVIDER_SITE_OTHER): Payer: Self-pay | Admitting: *Deleted

## 2010-12-02 ENCOUNTER — Telehealth: Payer: Self-pay | Admitting: Internal Medicine

## 2010-12-02 ENCOUNTER — Other Ambulatory Visit: Payer: Self-pay

## 2010-12-02 DIAGNOSIS — E039 Hypothyroidism, unspecified: Secondary | ICD-10-CM

## 2010-12-02 LAB — TSH: TSH: 3.2 u[IU]/mL (ref 0.35–5.50)

## 2010-12-07 NOTE — Progress Notes (Signed)
Summary: Lab Results  Phone Note Outgoing Call   Summary of Call: call [name] - thyroid blood test has improved keep taking same dose of thyroid replacement  I advise [name] repeat TSH in 6 months Initial call taken by: D. Thomos Lemons DO,  December 02, 2010 5:28 PM  Follow-up for Phone Call        call placed to patient at [phone], she was informed per Dr Artist Pais instructions, she states she will have labs drawn in [facility] Follow-up by: Glendell Docker CMA,  December 03, 2010 8:18 AM    Prescriptions: LEVOTHYROXINE SODIUM 25 MCG TABS (LEVOTHYROXINE SODIUM) one by mouth once daily  #90 x 1   Entered and Authorized by:   D. Thomos Lemons DO   Signed by:   D. Thomos Lemons DO on 12/02/2010   Method used:   Electronically to        [facility] [address])       [address]       [place], [place]  [address]       Ph: [phone]       Fax: [phone]   RxID:   [id number]

## 2011-01-03 LAB — GLUCOSE, CAPILLARY

## 2011-01-11 LAB — POCT I-STAT, CHEM 8
Creatinine, Ser: 0.9 mg/dL (ref 0.4–1.2)
Glucose, Bld: 93 mg/dL (ref 70–99)
Hemoglobin: 13.3 g/dL (ref 12.0–15.0)
Potassium: 4.3 mEq/L (ref 3.5–5.1)

## 2011-01-11 LAB — POCT CARDIAC MARKERS: CKMB, poc: 1.1 ng/mL (ref 1.0–8.0)

## 2011-02-08 NOTE — Consult Note (Signed)
[name], [name]                ACCOUNT NO.:  [id number]   MEDICAL RECORD NO.:  [id number]          PATIENT TYPE:  EMS   LOCATION:  [facility]                         FACILITY:  [facility]   PHYSICIAN:  Luis Abed, MD, FACCDATE OF BIRTH:  1946/07/15   DATE OF CONSULTATION:  DATE OF DISCHARGE:  11/20/2008                                 CONSULTATION   PRIMARY CARDIOLOGIST:  Luis Abed, MD, [facility]   PRIMARY MEDICAL DOCTOR:  Barbette Hair. Artist Pais, DO   REASON FOR CONSULTATION:  The patient is having chest pain.   HISTORY OF PRESENT ILLNESS:  This is a 65 year old Caucasian female with  a history of hyperlipidemia, borderline diabetes, GERD, and anxiety  presenting with atypical chest pain this a.m.  Chest pain was located in  the substernal area, it was a heaviness.  It came on early this  morning after the patient was already awake.  It lasted several minutes  but resolved when the patient started her day.  The patient denies any  associated symptoms at that time.  The patient also reports a few recent  episodes in the last month of chest pain with exertion, above and beyond  routine activities, associated with shortness of breath and  tachypalpitations, but no diaphoresis, nausea, or vomiting.  All of  these symptoms are in the setting of the patient's profound grief for  the loss of her mother last month.  The patient also reports a few  episodes of chest pain with exertion above and beyond routine daily  activities.  The patient was describing symptoms today at her work and a  12-lead EKG was done as she works in a medical office and her coworkers  advised her to seek evaluation in the emergency department.   PAST MEDICAL HISTORY:  1. Hyperlipidemia.  2. Anxiety.  3. GERD.  4. Borderline diabetes mellitus (recently diagnosed) with slightly      elevated hemoglobin A1c per patient.   SOCIAL HISTORY:  The patient lives at [place] with her husband.  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  She has no smoking  history.  No alcohol use.  No illicit drug use.  She takes multivitamin  but no other herbal medications.  She has a regular diet.  She does no  regular exercise.   FAMILY HISTORY:  Mother deceased recently secondary to complications  from brain hemorrhage and ? TIAs.  She also had atrial fibrillation,  COPD, and congestive heart failure.  Her father is still living with  Alzheimer disease.  He had coronary artery bypass grafting in the 70s.  The patient has no siblings.   REVIEW OF SYSTEMS:  As described in the HPI.  All other systems were  reviewed and negative.   ALLERGIES:  NKDA.   MEDICATIONS:  1. Aspirin 81 mg p.o. daily.  2. Prilosec ? Dose p.o. daily.  3. Metformin 500 mg p.o. b.i.d.  4. Xanax 0.25 to 0.5 mg p.r.n. (the patient has not taken any  recently).   PHYSICAL EXAMINATION:  VITAL SIGNS:  Temp 97.7 degrees Fahrenheit, BP  120/70, pulse 68, respiratory rate 20, O2 saturation 96% on room air.  GENERAL:  On exam, the patient was alert, oriented x3, in no apparent  distress.  Moved and spoke  easily without respiratory distress.  However, during the evaluation, she was tearful several times when the  discussing the events surrounding her mother's death last month.  HEENT:  Head was normocephalic and atraumatic.  Pupils were equal,  round, and reactive to light.  Extraocular muscles were intact.  Nares  were patent without discharge.  Dentition was good.  Oropharynx is  without erythema or exudates.  NECK:  Supple without lymphadenopathy.  No thyromegaly.  No JVD.  No  bruits.  HEART:  Irregular with an audible S1 and S2.  No clicks, rubs, murmurs  or gallops.  Pulses were 2+ and equal in upper extremities, decreased in  the left lower extremity, 2+ in the right lower extremity.  No femoral  bruits bilaterally.  LUNGS:  Clear to auscultation  bilaterally.  SKIN:  No rashes, lesions, or petechiae.  ABDOMEN:  Soft, nontender, nondistended.  Normal abdominal bowel sounds.  No hepatosplenomegaly.  No pulsation.  The patient is overweight.  EXTREMITIES:  No clubbing, cyanosis, or edema.  MUSCULOSKELETAL:  No joint deformity.  No effusions.  No spinal  tenderness.  No CVA tenderness.  No chest tenderness.  NEUROLOGIC:  Cranial nerves II through XII were grossly intact.  Strength is 5/5 in all extremities and axial groups.  Normal sensation  throughout and normal cerebellar function.   RADIOLOGY:  Chest x-ray showed no acute clear pulmonary abnormality.  EKG normal sinus rhythm at a rate of 64.  Normal axis.  No acute ST-T  wave changes.  No Q waves.  No evidence of hypertrophy.  PR 148, QRS 74,  QTc 421.  No difference in EKG performed in her office at the patient's  work earlier in the day.  No prior EKGs to compare to.   LABORATORY DATA:  HGB 13.3, HCT 39.0, sodium 141, potassium 4.3,  chloride 105, CO2 30.  BUN 12, creatinine 0.9, glucose 93.  Point-of-  care markers were negative.  D-dimer was negative at 0.30.   ASSESSMENT AND PLAN:  This is a 65 year old Caucasian female with a  history of hyperlipidemia, borderline diabetes mellitus,  gastroesophageal reflux disease, anxiety, presenting with atypical chest  pain this morning and a recent history of stable angina symptoms.  1. Atypical chest pain.  The patient is stable for discharge from the      emergency department from cardiologic prospective.  We feel      comfortable given the lack of objective evidence of an acute      cardiac problem following per the patient in her office with a      treadmill stress Myoview, and then a routine office visit as soon      as possible after that with Dr. Myrtis Ser.  2. Recent history of anginal symptoms, please see above plan.  3. Depression/anxiety.  The patient was spoken to at length by the      physician assistant, who recommended  that she go to her primary      care to initiate some grief counseling due to her profound      suffering due to the recent loss of her mother.  She was also      encouraged to begin to follow the  prescription written for Xanax      written by her      primary care doctor for short-term relief of anxiety symptoms      secondary to severe increase in stress of late.  The patient also      encouraged to resume her routine exercise as soon as she okays it      with her cardiologist at her upcoming post stress Myoview visit.      At the end of the consultation, the patient had no questions or      concerns.      Jarrett Ables, [facility]      Luis Abed, MD, [facility]  Electronically Signed    MS/MEDQ  D:  11/20/2008  T:  11/21/2008  Job:  [id number]

## 2011-02-11 NOTE — Op Note (Signed)
   NAME:  [name], [name]                          ACCOUNT NO.:  [id number]   MEDICAL RECORD NO.:  [id number]                   PATIENT TYPE:  AMB   LOCATION:  DSC                                  FACILITY:  [facility]   PHYSICIAN:  Jimmye Norman III, M.D.               DATE OF BIRTH:  [birth year]   DATE OF PROCEDURE:  09/12/2002  DATE OF DISCHARGE:                                 OPERATIVE REPORT   PREOPERATIVE DIAGNOSIS:  Right upper inner thigh cyst or mass.   POSTOPERATIVE DIAGNOSIS:  Right upper inner thigh cyst or mass.   PROCEDURE:  Excision of [name] 1 cm upper inner thigh mass from the right thigh.   SURGEON:  Jimmye Norman, M.D.   ASSISTANT:  None.   ANESTHESIA:  Monitored anesthesia care with 2% Xylocaine with epinephrine  anesthesia.   ESTIMATED BLOOD LOSS:  Less than 10 cc.   COMPLICATIONS:  None.   CONDITION:  Stable.   SPECIMENS:  Excised mass measuring approximately 5 x 3 cm in size.  Total  incision length was about 5.5 cm.   INDICATION FOR PROCEDURE:  The patient is an otherwise healthy 65 year old  female who had [name] previous cellulitis and right thigh mass incised and  drained requiring [name] hospitalization for treatment for an infection, who now  has [name] residual mass in the leg and comes in for excision.   DESCRIPTION OF PROCEDURE:  The patient was taken to the operating room and  placed on the table in the supine position.  She was froglegged, given IV  sedation, and prepped with Betadine in the usual sterile manner, exposing  the right inner thigh mass.   We anesthetized the area circumferentially using [name] 25-gauge needle with 2%  Xylocaine with epinephrine.  We injected approximately 5-6 cc of solution.  We subsequently used [name] 15 blade to make an elliptical incision around the  previously-marked site.  It was taken down into the subcutaneous tissue and  then subsequently excised with Metzenbaum scissors and also with  electrocautery.  Electrocautery was used to attain  hemostasis primarily.  Once we had excised the mass in total with no residual cyst __________ left  within, we reapproximated the subcutaneous tissue using interrupted 4-0  Vicryl deep stitches, and then the skin was closed using [name] running  subcuticular stitch of 4-0 Vicryl.  All needle counts, sponge counts, and  instrument counts were correct.  Sterile dressings were applied, including  Steri-Strips.                                              Kathrin Ruddy, M.D.   JW/MEDQ  D:  09/12/2002  T:  09/12/2002  Job:  [id number]

## 2011-02-11 NOTE — Discharge Summary (Signed)
NAME:  [name], Chelsey [name]                          ACCOUNT NO.:  [id number]   MEDICAL RECORD NO.:  [id number]                   PATIENT TYPE:  INP   LOCATION:  5008                                 FACILITY:  [facility]   PHYSICIAN:  Governor Specking, N.P. [facility]       DATE OF BIRTH:  1946-07-13   DATE OF ADMISSION:  05/31/2002  DATE OF DISCHARGE:  06/03/2002                                 DISCHARGE SUMMARY   ADMISSION DIAGNOSIS:  1. Cellulitis.   DISCHARGE DIAGNOSIS:  1. Cellulitis status post incision and drainage of sebaceous cyst.   PRIMARY CARE PHYSICIAN:  Lonzo Cloud. Kriste Basque, M.D. [facility]   LABORATORY DATA:  During admission BMET on May 31, 2002 normal.  CBC on  June 01, 2002 essentially normal.   HISTORY OF PRESENT ILLNESS:  Briefly, the patient is [name] 65 year old white  female with [name] known sebaceous cyst in the right medial thigh diagnosed in  January 2003.  It was stable until last week when it became inflamed and  swollen.  She was started on Duricef as an outpatient.  The lesion became  progressively red and swollen.  The wound was debrided as an outpatient at  Dr. Elvis Coil office, however, Dr. [place] had concerns about cellulitis and  recommended hospitalization for intravenous antibiotics and observation.   HOSPITAL COURSE:  Surgery was consulted to assist [place] with management of the  wound and for further follow up.  It appeared that the cellulitis was  expanding status post incision and drainage.  The patient was placed on  intravenous Unasyn.  Dr. Lindie Spruce followed the patient closely and by June 03, 2002 the patient appeared stable enough to be discharged home on p.o.  antibiotics and to continue wound care on an outpatient basis.  Instructions  for wound care were provided by Dr. Lindie Spruce, both verbally and in writing.  In  addition, Dr. Lindie Spruce would like to see the patient next Tuesday for follow up  appointment.  He explained that the patient will probably need [name] wide  excision at some point in the future but at this point he would wait until  the cellulitis quieted down.   DISCHARGE MEDICATIONS:  The patient is being discharged on two weeks of  Augmentin 875 mg p.o. b.i.d.   WOUND CARE:  Includes showering twice daily.  Wash the wound with normal  soap and rinse extremely well.  Pat dry.  Clean the wound with peroxide and  normal saline twice daily after the showers.  Apply antibiotic ointment  and  then [name] dry dressing.   CONDITION ON DISCHARGE:  The patient is stable at discharge.   PHYSICAL EXAMINATION:  VITAL SIGNS:  On discharge the temperature is 97.5,  pulse 63, respirations 20, blood pressure 120/70, oxygen saturation 97% on  room air.  EXTREMITIES:  Right medial thigh has an area of shrinking cellulitis.  The  overall area is approximately 25X25 cm of erythema and the  actual wound is  about 3-4 mm opening.   The patient is being discharged in stable and improved condition.                                               Governor Specking, N.P. [facility]    TJ/MEDQ  D:  06/03/2002  T:  06/04/2002  Job:  [id number]

## 2011-02-11 NOTE — Consult Note (Signed)
   NAME:  [name]                          ACCOUNT NO.:  [id number]   MEDICAL RECORD NO.:  [id number]                   PATIENT TYPE:  INP   LOCATION:  5008                                 FACILITY:  [facility]   PHYSICIAN:  Marta Lamas. Gae Bon, M.D.            DATE OF BIRTH:  1946/08/24   DATE OF CONSULTATION:  05/31/2002  DATE OF DISCHARGE:                                   CONSULTATION   REASON FOR CONSULTATION:  Thank you very much for asking me to see this  patient, [name] pleasant 65 year old woman with right inner thigh inflation and  abscess.   HISTORY OF PRESENT ILLNESS:  The story said the patient was seen in [name]  dermatologist's office for an infectious sebaceous cyst which was incised  and drained and packed.  She was subsequently admitted because of  surrounding cellulitis which, over the last 24 hours even on IV antibiotics  had supposedly gotten worse.  Surgical consultation was obtained.   The patient is not [name] diabetic.  She has no other medical problems from what  we understand.   CURRENT MEDICATIONS:  Unasyn and Vicodin for pain.  Also Tylenol for pain  and Ambien.   PHYSICAL EXAMINATION:  The patient has [name] wide portion of erythema around the  inner portion of her right thigh with an indurated area approximately 3 x 5  cm in size, oval in nature with an open center with some patchy necrotic  edges.  The patient has mild tenderness in that area but not severe.  She is  afebrile and white count is normal.   Based on the appearance of this ulceration in her thigh, this may require  further surgical debridement.  I will, however, give her 24 hours of IV  antibiotics to see if this should turn around.  However, if there is no  improvement, either on examination later on today or tomorrow, then excision  and drainage may be necessary.  The idea at that time will be to excise  wider from the initial area of drainage.                                               Marta Lamas.  Gae Bon, M.D.    JOW/MEDQ  D:  06/01/2002  T:  06/03/2002  Job:  [id number]

## 2011-02-17 ENCOUNTER — Encounter: Payer: Self-pay | Admitting: Internal Medicine

## 2011-03-03 ENCOUNTER — Ambulatory Visit: Payer: Self-pay | Admitting: Internal Medicine

## 2011-03-04 ENCOUNTER — Telehealth: Payer: Self-pay | Admitting: Internal Medicine

## 2011-03-04 NOTE — Telephone Encounter (Signed)
Patient refuses to see another doctor. Original appt was on 03-03-11, rescheduled to 05-17-11.

## 2011-03-04 NOTE — Telephone Encounter (Signed)
Reviewed last note and labs, everything appeared stable, provided the patient feels well she can wait until July

## 2011-05-06 ENCOUNTER — Encounter: Payer: Self-pay | Admitting: Internal Medicine

## 2011-05-09 ENCOUNTER — Ambulatory Visit: Payer: BC Managed Care – PPO

## 2011-05-09 DIAGNOSIS — E785 Hyperlipidemia, unspecified: Secondary | ICD-10-CM

## 2011-05-09 DIAGNOSIS — R7309 Other abnormal glucose: Secondary | ICD-10-CM

## 2011-05-09 LAB — BASIC METABOLIC PANEL
CO2: 29 mEq/L (ref 19–32)
Chloride: 104 mEq/L (ref 96–112)
Glucose, Bld: 114 mg/dL — ABNORMAL HIGH (ref 70–99)
Potassium: 4.5 mEq/L (ref 3.5–5.1)
Sodium: 141 mEq/L (ref 135–145)

## 2011-05-09 LAB — LIPID PANEL: Total CHOL/HDL Ratio: 2

## 2011-05-09 LAB — HIGH SENSITIVITY CRP: CRP, High Sensitivity: 4.96 mg/L (ref 0.000–5.000)

## 2011-05-09 LAB — HEMOGLOBIN A1C: Hgb A1c MFr Bld: 5.7 % (ref 4.6–6.5)

## 2011-05-09 LAB — HEPATIC FUNCTION PANEL
AST: 23 U/L (ref 0–37)
Albumin: 4 g/dL (ref 3.5–5.2)
Alkaline Phosphatase: 57 U/L (ref 39–117)
Bilirubin, Direct: 0.1 mg/dL (ref 0.0–0.3)
Total Protein: 7 g/dL (ref 6.0–8.3)

## 2011-05-11 ENCOUNTER — Encounter: Payer: Self-pay | Admitting: Internal Medicine

## 2011-05-17 ENCOUNTER — Ambulatory Visit (INDEPENDENT_AMBULATORY_CARE_PROVIDER_SITE_OTHER): Payer: BC Managed Care – PPO | Admitting: Internal Medicine

## 2011-05-17 ENCOUNTER — Encounter: Payer: Self-pay | Admitting: Internal Medicine

## 2011-05-17 ENCOUNTER — Ambulatory Visit: Payer: BC Managed Care – PPO | Admitting: Internal Medicine

## 2011-05-17 VITALS — BP 124/82 | HR 65 | Temp 98.7°F | Resp 18 | Ht 68.0 in | Wt 251.0 lb

## 2011-05-17 DIAGNOSIS — E039 Hypothyroidism, unspecified: Secondary | ICD-10-CM | POA: Insufficient documentation

## 2011-05-17 DIAGNOSIS — E785 Hyperlipidemia, unspecified: Secondary | ICD-10-CM

## 2011-05-17 DIAGNOSIS — R7309 Other abnormal glucose: Secondary | ICD-10-CM

## 2011-05-17 DIAGNOSIS — E038 Other specified hypothyroidism: Secondary | ICD-10-CM

## 2011-05-17 HISTORY — DX: Other specified hypothyroidism: E03.8

## 2011-05-17 LAB — T4, FREE: Free T4: 1.04 ng/dL (ref 0.80–1.80)

## 2011-05-17 MED ORDER — METFORMIN HCL ER 500 MG PO TB24: 500.0000 mg | ORAL_TABLET | Freq: Every day | ORAL | Status: DC

## 2011-05-17 NOTE — Assessment & Plan Note (Signed)
Good control. Continue statin therapy. Obtain lipid and lft prior to next visit.

## 2011-05-17 NOTE — Assessment & Plan Note (Signed)
Decrease metformin dose to QD. Low sugar/carb diet, exercise and wt loss recommended. Obtain chem7 and a1c prior to next visit

## 2011-05-17 NOTE — Patient Instructions (Signed)
Please schedule chem7, a1c (glucose intolerance) , lipid/lft (272.4) and tsh/free t4 (hypothryoidism) prior to next visit

## 2011-05-17 NOTE — Progress Notes (Signed)
  Subjective:    Patient ID: [name], female    DOB: 1946/07/16, 65 y.o.   MRN: [id number]  HPI Pt presents to clinic for followup of multiple medical problems. H/o glucose intolerance vs mild dm taking glucophage 500mg  bid. Tolerates without gi upset or diarrhea. A1c reviewed 5.7 and stable. Tolerates statin therapy without myalgias or abn lfts. Lipid profile reviewed under good control. H/o mild hypothyroidism on low dose synthroid without sx's of hyper or hypothyroidism. No active complaints.   Past Medical History  Diagnosis Date  . Palpitations   . Chest pain   . Hyperlipidemia   . Borderline diabetes mellitus   . Shortness of breath   . Anxiety   . GERD (gastroesophageal reflux disease)   . Heme positive stool   . Weakness   . Grief reaction   . URI (upper respiratory infection)   . Throat pain   . Dermatitis   . History of URI (upper respiratory infection)   . Diarrhea of presumed infectious origin   . Abdominal pain, unspecified site   . Other abnormal glucose   . Personal history of urinary calculi   . 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 Mass excision     right upper inner thigh or cyst    reports that she has never smoked. She does not have any smokeless tobacco history on file. She reports that she does not drink alcohol or use illicit drugs. family history includes Alzheimer's disease in her father; COPD in her mother; and Coronary artery disease in her father. Allergies  Allergen Reactions  . Atorvastatin     REACTION: Upset stomach     Review of Systems see hpi     Objective:   Physical Exam  Physical Exam  Nursing note and vitals reviewed. Constitutional: Appears well-developed and well-nourished. No distress.  HENT:  Head: Normocephalic and atraumatic.  Right Ear: External ear normal.  Left Ear: External ear normal.  Eyes: Conjunctivae are normal. No scleral icterus.  Neck:  Neck supple. Carotid bruit is not present.  Cardiovascular: Normal rate, regular rhythm and normal heart sounds.  Exam reveals no gallop and no friction rub.   No murmur heard. Pulmonary/Chest: Effort normal and breath sounds normal. No respiratory distress. He has no wheezes. no rales.  Lymphadenopathy:    He has no cervical adenopathy.  Neurological:Alert.  Skin: Skin is warm and dry. Not diaphoretic.  Psychiatric: Has a normal mood and affect.         Assessment & Plan:

## 2011-05-17 NOTE — Assessment & Plan Note (Signed)
Obtain tsh and free t4. asx

## 2011-06-21 LAB — URINALYSIS, ROUTINE W REFLEX MICROSCOPIC
Bilirubin Urine: NEGATIVE
Glucose, UA: NEGATIVE
Ketones, ur: NEGATIVE
Nitrite: NEGATIVE
Protein, ur: NEGATIVE
pH: 5

## 2011-06-21 LAB — HEPATIC FUNCTION PANEL
ALT: 37 — ABNORMAL HIGH
AST: 32
Alkaline Phosphatase: 67
Bilirubin, Direct: <0.1
Total Bilirubin: 0.6

## 2011-06-21 LAB — DIFFERENTIAL
Eosinophils Relative: 1
Lymphocytes Relative: 18
Lymphs Abs: 1.1
Monocytes Absolute: 0.5
Monocytes Relative: 9
Neutro Abs: 4.5

## 2011-06-21 LAB — CBC
HCT: 38.2
Hemoglobin: 13.1
RBC: 4.32

## 2011-06-21 LAB — BASIC METABOLIC PANEL
Calcium: 8.7
GFR calc Af Amer: >60
GFR calc non Af Amer: >60
Glucose, Bld: 125 — ABNORMAL HIGH
Potassium: 4.2
Sodium: 135

## 2011-06-21 LAB — URINE MICROSCOPIC-ADD ON

## 2011-08-29 ENCOUNTER — Telehealth: Payer: Self-pay | Admitting: Internal Medicine

## 2011-08-29 MED ORDER — LEVOTHYROXINE SODIUM 25 MCG PO CAPS: 25.0000 ug | ORAL_CAPSULE | Freq: Every day | ORAL | Status: DC

## 2011-08-29 NOTE — Telephone Encounter (Signed)
Refill-levothyroxin tab. Take one tablet by mouth every day. Qty 90 last fill 9.24.12

## 2011-08-29 NOTE — Telephone Encounter (Signed)
rx sent in electronically 

## 2011-08-30 ENCOUNTER — Other Ambulatory Visit: Payer: Self-pay | Admitting: Internal Medicine

## 2011-09-14 ENCOUNTER — Other Ambulatory Visit: Payer: Self-pay | Admitting: Internal Medicine

## 2012-02-06 ENCOUNTER — Encounter: Payer: Self-pay | Admitting: Internal Medicine

## 2012-05-16 ENCOUNTER — Other Ambulatory Visit: Payer: Self-pay | Admitting: Internal Medicine

## 2012-06-26 ENCOUNTER — Other Ambulatory Visit: Payer: Self-pay | Admitting: Internal Medicine

## 2012-07-19 ENCOUNTER — Other Ambulatory Visit: Payer: Self-pay | Admitting: Internal Medicine

## 2012-07-23 ENCOUNTER — Other Ambulatory Visit: Payer: Self-pay | Admitting: Internal Medicine

## 2012-07-23 NOTE — Telephone Encounter (Signed)
Pt needs refill on [facility] call into [facility] [phone]

## 2012-07-23 NOTE — Telephone Encounter (Signed)
Pt needs OV, has not been since August 2012

## 2012-07-23 NOTE — Telephone Encounter (Signed)
Pt is sch for 07-25-2012 230pm

## 2012-07-25 ENCOUNTER — Encounter: Payer: Self-pay | Admitting: Internal Medicine

## 2012-07-25 ENCOUNTER — Ambulatory Visit (INDEPENDENT_AMBULATORY_CARE_PROVIDER_SITE_OTHER): Payer: BC Managed Care – PPO | Admitting: Internal Medicine

## 2012-07-25 VITALS — BP 146/82 | HR 84 | Temp 98.6°F | Wt 268.0 lb

## 2012-07-25 DIAGNOSIS — E039 Hypothyroidism, unspecified: Secondary | ICD-10-CM

## 2012-07-25 DIAGNOSIS — R7309 Other abnormal glucose: Secondary | ICD-10-CM

## 2012-07-25 DIAGNOSIS — E785 Hyperlipidemia, unspecified: Secondary | ICD-10-CM

## 2012-07-25 LAB — HEMOGLOBIN A1C: Hgb A1c MFr Bld: 6.1 % (ref 4.6–6.5)

## 2012-07-25 LAB — HEPATIC FUNCTION PANEL
Albumin: 3.3 g/dL — ABNORMAL LOW (ref 3.5–5.2)
Alkaline Phosphatase: 63 U/L (ref 39–117)
Total Bilirubin: 0.4 mg/dL (ref 0.3–1.2)

## 2012-07-25 LAB — BASIC METABOLIC PANEL
BUN: 18 mg/dL (ref 6–23)
Calcium: 9 mg/dL (ref 8.4–10.5)
Creatinine, Ser: 0.9 mg/dL (ref 0.4–1.2)
GFR: 70.43 mL/min (ref >60.00)
Glucose, Bld: 84 mg/dL (ref 70–99)
Potassium: 4.4 mEq/L (ref 3.5–5.1)

## 2012-07-25 LAB — LIPID PANEL
Cholesterol: 228 mg/dL — ABNORMAL HIGH (ref 0–200)
HDL: 60.5 mg/dL (ref >39.00)
VLDL: 19.2 mg/dL (ref 0.0–40.0)

## 2012-07-25 LAB — T4, FREE: Free T4: 0.94 ng/dL (ref 0.60–1.60)

## 2012-07-25 MED ORDER — PRAVASTATIN SODIUM 40 MG PO TABS: 40.0000 mg | ORAL_TABLET | Freq: Every day | ORAL | Status: DC

## 2012-07-25 MED ORDER — METFORMIN HCL ER 500 MG PO TB24: 500.0000 mg | ORAL_TABLET | Freq: Two times a day (BID) | ORAL | Status: DC

## 2012-07-25 NOTE — Patient Instructions (Signed)
Goal weight loss 27 lbs within next 6 months

## 2012-07-25 NOTE — Assessment & Plan Note (Signed)
Patient encouraged to follow a low carbohydrate diet.  Continue metformin. Monitor A1c.

## 2012-07-25 NOTE — Progress Notes (Signed)
Subjective:    Patient ID: [name], female    DOB: 1946/02/16, 66 y.o.   MRN: [id number]  HPI  66 year old white female with history of abnormal glucose, hypothyroidism and hyperlipidemia for follow up. Overall patient has been doing well. She is enjoying retirement. Patient has gained greater than 20 pounds since previous visit. She is not following low-carb diet.  Hyperlipidemia-no difficulty tolerating pravastatin.  Review of Systems Negative for chest pain or shortness of breath  Past Medical History  Diagnosis Date  . Palpitations   . Chest pain   . Hyperlipidemia   . Borderline diabetes mellitus   . Shortness of breath   . Anxiety   . GERD (gastroesophageal reflux disease)   . Heme positive stool   . Weakness   . Grief reaction   . URI (upper respiratory infection)   . Throat pain   . Dermatitis   . History of URI (upper respiratory infection)   . Diarrhea of presumed infectious origin   . Abdominal pain, unspecified site   . Other abnormal glucose   . Personal history of urinary calculi   . 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 Marital Status: Married    Spouse Name: N/A    Number of Children: N/A  . Years of Education: N/A   Occupational History  . Not on file.   Social History Main Topics  . Smoking status: Never Smoker   . Smokeless tobacco: Never Used  . Alcohol Use: No  . Drug Use: No  . Sexually Active: Not on file   Other Topics Concern  . 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 Mass excision     right upper inner thigh or cyst    Family History  Problem Relation Age of Onset  . COPD Mother     deceased Nov 01, 2008  . Alzheimer's disease Father     deceased 01-Sep-2009  . Coronary artery  disease Father     s/p cabg    Allergies  Allergen Reactions  . Atorvastatin     REACTION: Upset stomach    Current Outpatient Prescriptions on File Prior to Visit  Medication Sig Dispense Refill  . aspirin 81 MG tablet Take 81 mg by mouth daily.        . Levothyroxine Sodium 25 MCG CAPS Take 1 capsule (25 mcg total) by mouth daily.  30 capsule  5  . Multiple Vitamin (MULTIVITAMIN) tablet Take 1 tablet by mouth daily.        . Omega-3 Fatty Acids (FISH OIL) 1000 MG CAPS Take 1,000 capsules by mouth 3 (three) times daily.        Marland Kitchen omeprazole (PRILOSEC) 20 MG capsule Take 20 mg by mouth daily.        Marland Kitchen DISCONTD: metFORMIN (GLUCOPHAGE-XR) 500 MG 24 hr tablet TAKE ONE TABLET BY MOUTH TWICE DAILY  180 tablet  1  . DISCONTD: pravastatin (PRAVACHOL) 40 MG tablet TAKE ONE TABLET BY MOUTH IN THE EVENING  30 tablet  0    BP 146/82  Pulse 84  Temp 98.6 F (37 C) (Oral)  Wt 268 lb (121.564 kg)       Objective:  Physical Exam  Constitutional: She is oriented to person, place, and time. She appears well-developed and well-nourished.  Cardiovascular: Normal rate, regular rhythm and normal heart sounds.   Pulmonary/Chest: Effort normal and breath sounds normal. She has no wheezes.  Neurological: She is alert and oriented to person, place, and time.  Psychiatric: She has a normal mood and affect. Her behavior is normal.       Assessment & Plan:

## 2012-07-25 NOTE — Assessment & Plan Note (Signed)
Monitor thyroid function studies. Adjust levothyroxine dose accordingly.  

## 2012-07-25 NOTE — Assessment & Plan Note (Signed)
Monitor fasting lipid panel and liver function tests.

## 2012-07-30 ENCOUNTER — Other Ambulatory Visit: Payer: Self-pay | Admitting: *Deleted

## 2012-07-30 MED ORDER — LEVOTHYROXINE SODIUM 25 MCG PO CAPS: 25.0000 ug | ORAL_CAPSULE | Freq: Every day | ORAL | Status: DC

## 2012-11-22 ENCOUNTER — Other Ambulatory Visit: Payer: Self-pay | Admitting: Internal Medicine

## 2012-12-03 ENCOUNTER — Telehealth: Payer: Self-pay | Admitting: Internal Medicine

## 2012-12-03 MED ORDER — LEVOTHYROXINE SODIUM 25 MCG PO CAPS: 25.0000 ug | ORAL_CAPSULE | Freq: Every day | ORAL | Status: DC

## 2012-12-03 NOTE — Telephone Encounter (Signed)
Caller: Lamona/Patient; Phone: [phone]; Reason for Call: Pt advises [facility] [phone]) has sent refill request for Levothyroxine with no response.  They have changed vendors and need MD approval.  Pt only has 3 pills left.  Please call her to advise when this has been taken care of.

## 2012-12-03 NOTE — Telephone Encounter (Signed)
rx sent in electronically 

## 2012-12-19 ENCOUNTER — Encounter: Payer: Self-pay | Admitting: Internal Medicine

## 2013-06-26 ENCOUNTER — Other Ambulatory Visit (INDEPENDENT_AMBULATORY_CARE_PROVIDER_SITE_OTHER): Payer: Medicare Other

## 2013-06-26 DIAGNOSIS — Z0000 Encounter for general adult medical examination without abnormal findings: Secondary | ICD-10-CM

## 2013-06-26 DIAGNOSIS — E785 Hyperlipidemia, unspecified: Secondary | ICD-10-CM

## 2013-06-26 DIAGNOSIS — R7309 Other abnormal glucose: Secondary | ICD-10-CM

## 2013-06-26 LAB — BASIC METABOLIC PANEL
Calcium: 8.8 mg/dL (ref 8.4–10.5)
Creatinine, Ser: 0.9 mg/dL (ref 0.4–1.2)

## 2013-06-26 LAB — LDL CHOLESTEROL, DIRECT: Direct LDL: 152.1 mg/dL

## 2013-06-26 LAB — CBC WITH DIFFERENTIAL/PLATELET
Basophils Absolute: 0 10*3/uL (ref 0.0–0.1)
Eosinophils Absolute: 0.1 10*3/uL (ref 0.0–0.7)
HCT: 36.8 % (ref 36.0–46.0)
Hemoglobin: 12.6 g/dL (ref 12.0–15.0)
Lymphs Abs: 2.3 10*3/uL (ref 0.7–4.0)
MCHC: 34.2 g/dL (ref 30.0–36.0)
Neutro Abs: 3.9 10*3/uL (ref 1.4–7.7)
RDW: 13.6 % (ref 11.5–14.6)

## 2013-06-26 LAB — HEPATIC FUNCTION PANEL
ALT: 19 U/L (ref 0–35)
Alkaline Phosphatase: 70 U/L (ref 39–117)
Bilirubin, Direct: 0 mg/dL (ref 0.0–0.3)
Total Protein: 6.9 g/dL (ref 6.0–8.3)

## 2013-06-26 LAB — LIPID PANEL
Cholesterol: 226 mg/dL — ABNORMAL HIGH (ref 0–200)
Triglycerides: 99 mg/dL (ref 0.0–149.0)

## 2013-06-26 LAB — POCT URINALYSIS DIPSTICK
Bilirubin, UA: NEGATIVE
Blood, UA: NEGATIVE
Ketones, UA: NEGATIVE
pH, UA: 5.5

## 2013-06-26 LAB — MICROALBUMIN / CREATININE URINE RATIO: Microalb Creat Ratio: 0.9 mg/g (ref 0.0–30.0)

## 2013-07-09 ENCOUNTER — Ambulatory Visit (INDEPENDENT_AMBULATORY_CARE_PROVIDER_SITE_OTHER): Payer: Medicare Other | Admitting: Internal Medicine

## 2013-07-09 ENCOUNTER — Encounter: Payer: Self-pay | Admitting: Internal Medicine

## 2013-07-09 VITALS — BP 132/82 | HR 72 | Temp 98.7°F | Ht 68.0 in | Wt 256.0 lb

## 2013-07-09 DIAGNOSIS — Z0000 Encounter for general adult medical examination without abnormal findings: Secondary | ICD-10-CM

## 2013-07-09 DIAGNOSIS — R7309 Other abnormal glucose: Secondary | ICD-10-CM

## 2013-07-09 DIAGNOSIS — E785 Hyperlipidemia, unspecified: Secondary | ICD-10-CM

## 2013-07-09 DIAGNOSIS — E039 Hypothyroidism, unspecified: Secondary | ICD-10-CM

## 2013-07-09 DIAGNOSIS — Z23 Encounter for immunization: Secondary | ICD-10-CM

## 2013-07-09 MED ORDER — METFORMIN HCL ER 500 MG PO TB24: 500.0000 mg | ORAL_TABLET | Freq: Two times a day (BID) | ORAL | Status: DC

## 2013-07-09 MED ORDER — PRAVASTATIN SODIUM 40 MG PO TABS: 40.0000 mg | ORAL_TABLET | Freq: Every day | ORAL | Status: DC

## 2013-07-09 MED ORDER — LEVOTHYROXINE SODIUM 25 MCG PO CAPS: 25.0000 ug | ORAL_CAPSULE | Freq: Every day | ORAL | Status: DC

## 2013-07-09 NOTE — Assessment & Plan Note (Signed)
TFTs normal.  Continue same dose of levothyroxine. Lab Results  Component Value Date   TSH 2.46 06/26/2013

## 2013-07-09 NOTE — Assessment & Plan Note (Signed)
A1c is slightly higher at 6.2. Continue metformin as directed. Encouraged low carb diet and regular exercise.

## 2013-07-09 NOTE — Patient Instructions (Signed)
Please complete the following lab tests before your next follow up appointment: BMET, A1c, CBCD - 790.29 FLP, LFTs - 272.4 TSH - 244.9 Take Vitamin D3 2000 units once daily

## 2013-07-09 NOTE — Assessment & Plan Note (Signed)
Reviewed adult health maintenance protocols.  Patient updated with pneumovax.  She will inquire about shingles vaccine.  I encouraged regular exercise.

## 2013-07-09 NOTE — Assessment & Plan Note (Signed)
Patient takes her cholesterol medications intermittently. Patient advised to take pravastatin and a regular basis.  Lab Results  Component Value Date   CHOL 226* 06/26/2013   HDL 60.00 06/26/2013   LDLCALC 84 05/09/2011   LDLDIRECT 152.1 06/26/2013   TRIG 99.0 06/26/2013   CHOLHDL 4 06/26/2013   Lab Results  Component Value Date   ALT 19 06/26/2013   AST 21 06/26/2013   ALKPHOS 70 06/26/2013   BILITOT 0.3 06/26/2013

## 2013-07-09 NOTE — Progress Notes (Signed)
Subjective:    Patient ID: [name], female    DOB: 1946-06-24, 67 y.o.   MRN: [id number]  HPI  67 year old white female with history of prediabetes, hyperlipidemia and obesity for routine physical. Patient denies any significant interval history. It has been a year and a half since her last Pap smear. She is having issues with her insurance company covering for routine Pap and pelvic exam. She plans to followup with her gynecologist.  She has had no hospitalizations within the last year nor any surgeries. Her last eye exam was in 2010.  Medicare questionnaire reviewed in detail - see attached sheet.  Prediabetes - does not always follow diabetic diet.  Exercises intermittently.  Patient and her husband returned from cross country trip. Review of Systems    Constitutional: Negative for activity change, appetite change and unexpected weight change. She exercises on and off.  [employer] to discontinue coverage for silver sneakers program. Eyes: Negative for visual disturbance.  Respiratory: Negative for cough, chest tightness and shortness of breath.   Cardiovascular: Negative for chest pain.  Genitourinary: Negative for difficulty urinating.  Neurological: Negative for headaches.  Gastrointestinal: Negative for abdominal pain, heartburn melena or hematochezia Psych: Negative for depression or anxiety Endo:  No polyuria or polydypsia     Past Medical History  Diagnosis Date  . Palpitations   . Chest pain   . Hyperlipidemia   . Borderline diabetes mellitus   . Shortness of breath   . Anxiety   . GERD (gastroesophageal reflux disease)   . Heme positive stool   . Weakness   . Grief reaction   . URI (upper respiratory infection)   . Throat pain   . Dermatitis   . History of URI (upper respiratory infection)   . Diarrhea of presumed infectious origin   . Abdominal pain, unspecified site   . Other abnormal glucose   . Personal history of urinary calculi    . 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 Marital Status: Married    Spouse Name: N/A    Number of Children: N/A  . Years of Education: N/A   Occupational History  . Not on file.   Social History Main Topics  . Smoking status: Never Smoker   . Smokeless tobacco: Never Used  . Alcohol Use: No  . Drug Use: No  . Sexual Activity: Not on file   Other Topics Concern  . Not on file   Social History Narrative   Retired Presenter, broadcasting for [employer]   Married (husband - Renae Fickle), no children   Never Smoked    Alcohol use-no      Daily Caffeine Use 2/day    Past Surgical History  Procedure Laterality Date  . Mass excision      right upper inner thigh or cyst    Family History  Problem Relation Age of Onset  . COPD Mother     deceased 10-20-2008  . Alzheimer's disease Father     deceased 08-20-2009  . Coronary artery disease Father     s/p cabg    Allergies  Allergen Reactions  . Atorvastatin     REACTION: Upset stomach    Current Outpatient Prescriptions on File Prior to Visit  Medication Sig Dispense Refill  . aspirin 81 MG tablet Take 81 mg by mouth daily.        Marland Kitchen  Multiple Vitamin (MULTIVITAMIN) tablet Take 1 tablet by mouth daily.        . Omega-3 Fatty Acids (FISH OIL) 1000 MG CAPS Take 1,000 capsules by mouth 3 (three) times daily.        Marland Kitchen omeprazole (PRILOSEC) 20 MG capsule Take 20 mg by mouth daily.         No current facility-administered medications on file prior to visit.    BP 132/82  Pulse 72  Temp(Src) 98.7 F (37.1 C) (Oral)  Ht 5\' 8"  (1.727 m)  Wt 256 lb (116.121 kg)  BMI 38.93 kg/m2    Objective:   Physical Exam  Constitutional: She is oriented to person, place, and time. She appears well-developed and well-nourished. No distress.  HENT:  Head: Normocephalic and atraumatic.  Right Ear: External ear normal.  Left Ear: External ear normal.  Mouth/Throat: Oropharynx is  clear and moist.  Eyes: EOM are normal. Pupils are equal, round, and reactive to light.  Neck: Neck supple.  No carotid bruit  Cardiovascular: Normal rate, regular rhythm and normal heart sounds.   No murmur heard. Pulmonary/Chest: Effort normal and breath sounds normal. She has no wheezes.  Abdominal: Soft. Bowel sounds are normal. She exhibits no mass. There is no tenderness.  Musculoskeletal: Normal range of motion. She exhibits no edema.  Lymphadenopathy:    She has no cervical adenopathy.  Neurological: She is alert and oriented to person, place, and time. No cranial nerve deficit.  Skin: Skin is warm and dry.  Psychiatric: She has a normal mood and affect. Her behavior is normal.          Assessment & Plan:

## 2013-07-10 ENCOUNTER — Telehealth: Payer: Self-pay | Admitting: Internal Medicine

## 2013-07-10 DIAGNOSIS — Z780 Asymptomatic menopausal state: Secondary | ICD-10-CM

## 2013-07-10 NOTE — Telephone Encounter (Signed)
Spoke with patient and she stated that she would like to have her bone density scan done at the [facility]. Please assist.

## 2013-07-10 NOTE — Telephone Encounter (Signed)
Too soon, need to wait 4 wks, pt aware, she will schedule appt

## 2013-07-10 NOTE — Telephone Encounter (Signed)
Referral order placed.

## 2013-07-10 NOTE — Telephone Encounter (Signed)
Pt states she received pnuemonia vaccine at last visit.  She is coming in with her husband later this month 10/20 for labs and 10/27 for cpx with Dr. Artist Pais.  Pt wants to know if she can get her shingles vaccine on either of these dates, or will it be too soon.  If so how long does she need to wait to get shingles vaccine.

## 2013-07-11 ENCOUNTER — Other Ambulatory Visit: Payer: Self-pay | Admitting: *Deleted

## 2013-07-11 MED ORDER — LEVOTHYROXINE SODIUM 25 MCG PO TABS: 25.0000 ug | ORAL_TABLET | Freq: Every day | ORAL | Status: DC

## 2013-07-12 ENCOUNTER — Telehealth: Payer: Self-pay | Admitting: Internal Medicine

## 2013-07-12 MED ORDER — CIPROFLOXACIN HCL 500 MG PO TABS: 500.0000 mg | ORAL_TABLET | Freq: Two times a day (BID) | ORAL | Status: DC

## 2013-07-12 NOTE — Telephone Encounter (Signed)
Per Dr Kirtland Bouchard ok to call in cipro 500 mg bid x3 days, rx sent in electronically

## 2013-07-12 NOTE — Telephone Encounter (Signed)
Pt states she has urine infection as she had a couple of weeks ago. Pt did not get that antibiotic a couple of weeks ago, but needs that now!! Pt is symptomatic! Can you send asap? Pharm; [facility]/ [place]  Pt would like a call back

## 2014-01-21 ENCOUNTER — Encounter: Payer: Self-pay | Admitting: Internal Medicine

## 2014-08-12 ENCOUNTER — Encounter: Payer: Medicare Other | Admitting: Internal Medicine

## 2014-09-10 ENCOUNTER — Other Ambulatory Visit (INDEPENDENT_AMBULATORY_CARE_PROVIDER_SITE_OTHER): Payer: Medicare Other

## 2014-09-10 DIAGNOSIS — E785 Hyperlipidemia, unspecified: Secondary | ICD-10-CM

## 2014-09-10 DIAGNOSIS — Z0000 Encounter for general adult medical examination without abnormal findings: Secondary | ICD-10-CM

## 2014-09-10 DIAGNOSIS — E875 Hyperkalemia: Secondary | ICD-10-CM

## 2014-09-10 DIAGNOSIS — R7309 Other abnormal glucose: Secondary | ICD-10-CM

## 2014-09-10 LAB — BASIC METABOLIC PANEL
BUN: 22 mg/dL (ref 6–23)
CALCIUM: 9.4 mg/dL (ref 8.4–10.5)
CO2: 29 mEq/L (ref 19–32)
Chloride: 106 mEq/L (ref 96–112)
Creatinine, Ser: 0.9 mg/dL (ref 0.4–1.2)
GFR: 64.73 mL/min (ref >60.00)
GLUCOSE: 106 mg/dL — AB (ref 70–99)
POTASSIUM: 5.4 meq/L — AB (ref 3.5–5.1)
Sodium: 140 mEq/L (ref 135–145)

## 2014-09-10 LAB — POCT URINALYSIS DIPSTICK
BILIRUBIN UA: NEGATIVE
Glucose, UA: NEGATIVE
Ketones, UA: NEGATIVE
Nitrite, UA: NEGATIVE
PH UA: 6.5
Protein, UA: NEGATIVE
RBC UA: NEGATIVE
SPEC GRAV UA: 1.02
Urobilinogen, UA: 0.2

## 2014-09-10 LAB — CBC WITH DIFFERENTIAL/PLATELET
Basophils Absolute: 0 10*3/uL (ref 0.0–0.1)
Basophils Relative: 0.5 % (ref 0.0–3.0)
EOS PCT: 1.1 % (ref 0.0–5.0)
Eosinophils Absolute: 0.1 10*3/uL (ref 0.0–0.7)
HEMATOCRIT: 39.5 % (ref 36.0–46.0)
Hemoglobin: 12.9 g/dL (ref 12.0–15.0)
LYMPHS ABS: 2.2 10*3/uL (ref 0.7–4.0)
LYMPHS PCT: 34.9 % (ref 12.0–46.0)
MCHC: 32.6 g/dL (ref 30.0–36.0)
MCV: 89.2 fl (ref 78.0–100.0)
MONOS PCT: 7.5 % (ref 3.0–12.0)
Monocytes Absolute: 0.5 10*3/uL (ref 0.1–1.0)
Neutro Abs: 3.5 10*3/uL (ref 1.4–7.7)
Neutrophils Relative %: 56 % (ref 43.0–77.0)
PLATELETS: 189 10*3/uL (ref 150.0–400.0)
RBC: 4.43 Mil/uL (ref 3.87–5.11)
RDW: 13.6 % (ref 11.5–15.5)
WBC: 6.3 10*3/uL (ref 4.0–10.5)

## 2014-09-10 LAB — LIPID PANEL
Cholesterol: 214 mg/dL — ABNORMAL HIGH (ref 0–200)
HDL: 61 mg/dL (ref >39.00)
LDL CALC: 137 mg/dL — AB (ref 0–99)
NONHDL: 153
TRIGLYCERIDES: 82 mg/dL (ref 0.0–149.0)
Total CHOL/HDL Ratio: 4
VLDL: 16.4 mg/dL (ref 0.0–40.0)

## 2014-09-10 LAB — HEPATIC FUNCTION PANEL
ALT: 22 U/L (ref 0–35)
AST: 25 U/L (ref 0–37)
Albumin: 3.7 g/dL (ref 3.5–5.2)
Alkaline Phosphatase: 61 U/L (ref 39–117)
BILIRUBIN TOTAL: 0.6 mg/dL (ref 0.2–1.2)
Bilirubin, Direct: 0 mg/dL (ref 0.0–0.3)
Total Protein: 7 g/dL (ref 6.0–8.3)

## 2014-09-10 LAB — TSH: TSH: 4.96 u[IU]/mL — AB (ref 0.35–4.50)

## 2014-09-10 LAB — HEMOGLOBIN A1C: Hgb A1c MFr Bld: 6 % (ref 4.6–6.5)

## 2014-09-17 ENCOUNTER — Encounter: Payer: Self-pay | Admitting: Internal Medicine

## 2014-09-17 ENCOUNTER — Ambulatory Visit (INDEPENDENT_AMBULATORY_CARE_PROVIDER_SITE_OTHER): Payer: Medicare Other | Admitting: Internal Medicine

## 2014-09-17 VITALS — BP 146/90 | HR 80 | Temp 100.0°F | Ht 67.5 in | Wt 236.0 lb

## 2014-09-17 DIAGNOSIS — E875 Hyperkalemia: Secondary | ICD-10-CM

## 2014-09-17 DIAGNOSIS — E785 Hyperlipidemia, unspecified: Secondary | ICD-10-CM

## 2014-09-17 DIAGNOSIS — Z0000 Encounter for general adult medical examination without abnormal findings: Secondary | ICD-10-CM

## 2014-09-17 LAB — BASIC METABOLIC PANEL
BUN: 14 mg/dL (ref 6–23)
CO2: 28 mEq/L (ref 19–32)
CREATININE: 0.9 mg/dL (ref 0.4–1.2)
Calcium: 8.9 mg/dL (ref 8.4–10.5)
Chloride: 102 mEq/L (ref 96–112)
GFR: 66.39 mL/min (ref >60.00)
GLUCOSE: 86 mg/dL (ref 70–99)
Potassium: 4.3 mEq/L (ref 3.5–5.1)
Sodium: 136 mEq/L (ref 135–145)

## 2014-09-17 MED ORDER — PRAVASTATIN SODIUM 40 MG PO TABS: 40.0000 mg | ORAL_TABLET | Freq: Every day | ORAL | Status: DC

## 2014-09-17 NOTE — Patient Instructions (Signed)
Please complete the following lab tests before your next follow up appointment: TSH - 244.9 FLP, LFTs - 272.4

## 2014-09-17 NOTE — Assessment & Plan Note (Signed)
Reviewed adult health maintenance protocols.  Patient up-to-date with mammogram.  Her last colonoscopy was in June 2010. Patient plans to establish with new gynecologist in 2016. Her last Pneumovax was in October 2014. She declines influenza vaccine.  Patient stops metformin. Her A1c is stable. She may be able to control her prediabetes with diet and exercise.  Patient advised to restart Pravachol.  Patient also stopped Synthroid. 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 Monitor TSH in 6 months.

## 2014-09-17 NOTE — Progress Notes (Signed)
Subjective:    Patient ID: [name], female    DOB: 1945-11-09, 68 y.o.   MRN: [id number]  HPI  68 year old white female with history of prediabetes, hyperlipidemia And obesity presents for routine physical. 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  Patient reports he has been many months since she took any of her medications. Patient has lost 20 pounds since previous visit. Patient reports dieting and following healthy lifestyle. She discontinued all of her medications.  Health maintenance items reviewed. Her last colonoscopy was in August 2010. Her last mammogram completed April 2015. Her last Pneumovax was in October 2014. She declines influenza vaccine.  She was last seen by GYN 3-4 years ago.  She plans to establish with new GYN next year.  Review of Systems  Constitutional: Negative for activity change, appetite change and unexpected weight change.  Eyes: Negative for visual disturbance.  Respiratory: Negative for cough, chest tightness and shortness of breath.  Cardiovascular: Negative for chest pain.  Genitourinary: Negative for difficulty urinating.  Neurological: Negative for headaches.  Gastrointestinal: Negative for abdominal pain, heartburn melena or hematochezia Psych: Negative for depression or anxiety Endo:  No polyuria or polydypsia        Past Medical History  Diagnosis Date  . Palpitations   . Chest pain   . Hyperlipidemia   . Borderline diabetes mellitus   . GERD (gastroesophageal reflux disease)   . Weakness   . Grief reaction   . Personal history of urinary calculi   . 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 Marital Status: Married    Spouse Name: N/A    Number of Children: N/A  . Years of Education: N/A   Occupational History  . Not on file.   Social History Main Topics  . Smoking status: Never Smoker   . Smokeless tobacco: Never Used  .  Alcohol Use: No  . Drug Use: No  . Sexual Activity: Not on file   Other Topics Concern  . Not on file   Social History Narrative   Retired Merchandiser, retail for [employer]   Married (husband - [name]), no children   Never Smoked    Alcohol use-no      Daily Caffeine Use 2/day    Past Surgical History  Procedure Laterality Date  . Mass excision      right upper inner thigh or cyst    Family History  Problem Relation Age of Onset  . COPD Mother     deceased 31-Oct-2008  . Alzheimer's disease Father     deceased August 30, 2009  . Coronary artery disease Father     s/p cabg    Allergies  Allergen Reactions  . Atorvastatin     REACTION: Upset stomach    Current Outpatient Prescriptions on File Prior to Visit  Medication Sig Dispense Refill  . aspirin 81 MG tablet Take 81 mg by mouth daily.      Marland Kitchen omeprazole (PRILOSEC) 20 MG capsule Take 20 mg by mouth daily.       No current facility-administered medications on file prior to visit.    BP 146/90 mmHg  Pulse 80  Temp(Src) 100 F (37.8 C) (Oral)  Ht 5' 7.5" (1.715 m)  Wt 236 lb (107.049 kg)  BMI 36.40 kg/m2     Objective:   Physical Exam   Constitutional:  Appears well-developed and well-nourished. No distress.  Head: Normocephalic and atraumatic.  Ear:  Right and left ear normal.  TMs clear.  Hearing is grossly normal Mouth/Throat: Oropharynx is clear and moist.  Eyes: Conjunctivae are normal. Pupils are equal, round, and reactive to light.  Neck: Normal range of motion. Neck supple. No thyromegaly present. No carotid bruit Cardiovascular: Normal rate, regular rhythm and normal heart sounds.  Exam reveals no gallop and no friction rub.  No murmur heard. Pulmonary/Chest: Effort normal and breath sounds normal.  No wheezes. No rales.  Abdominal: Soft. Bowel sounds are normal. No mass. There is no tenderness.  Neurological: Alert. No cranial nerve deficit.  Skin: Skin is warm and dry.  Psychiatric: Normal  mood and affect. Behavior is normal.          Assessment & Plan:

## 2014-09-17 NOTE — Progress Notes (Signed)
Pre visit review using our clinic review tool, if applicable. No additional management support is needed unless otherwise documented below in the visit note. 

## 2015-04-03 ENCOUNTER — Encounter: Payer: Self-pay | Admitting: Internal Medicine

## 2015-04-09 ENCOUNTER — Telehealth: Payer: Self-pay | Admitting: Internal Medicine

## 2015-04-09 DIAGNOSIS — R739 Hyperglycemia, unspecified: Secondary | ICD-10-CM

## 2015-04-09 DIAGNOSIS — E785 Hyperlipidemia, unspecified: Secondary | ICD-10-CM

## 2015-04-09 NOTE — Telephone Encounter (Signed)
Orders have been placed.  Please schedule patient.

## 2015-04-09 NOTE — Telephone Encounter (Signed)
Pt has been sch

## 2015-04-09 NOTE — Telephone Encounter (Signed)
Yes, please schedule BMET, A1c, CBCD - 790.29 FLP, TSH and LFTs - 272.4

## 2015-04-09 NOTE — Telephone Encounter (Signed)
Pt has an appt sch for 04-15-15 and would like to have fasting labs prior to appt. Pt would like to have chole and tsh and glucose. Can I sch?

## 2015-04-10 ENCOUNTER — Other Ambulatory Visit (INDEPENDENT_AMBULATORY_CARE_PROVIDER_SITE_OTHER): Payer: PPO

## 2015-04-10 DIAGNOSIS — E785 Hyperlipidemia, unspecified: Secondary | ICD-10-CM

## 2015-04-10 DIAGNOSIS — R7309 Other abnormal glucose: Secondary | ICD-10-CM

## 2015-04-10 DIAGNOSIS — R739 Hyperglycemia, unspecified: Secondary | ICD-10-CM

## 2015-04-10 DIAGNOSIS — E784 Other hyperlipidemia: Secondary | ICD-10-CM | POA: Diagnosis not present

## 2015-04-10 LAB — CBC WITH DIFFERENTIAL/PLATELET
BASOS PCT: 0.5 % (ref 0.0–3.0)
Basophils Absolute: 0 10*3/uL (ref 0.0–0.1)
EOS ABS: 0.1 10*3/uL (ref 0.0–0.7)
Eosinophils Relative: 1.5 % (ref 0.0–5.0)
HCT: 39.4 % (ref 36.0–46.0)
Hemoglobin: 13.2 g/dL (ref 12.0–15.0)
LYMPHS ABS: 2.4 10*3/uL (ref 0.7–4.0)
LYMPHS PCT: 39.1 % (ref 12.0–46.0)
MCHC: 33.6 g/dL (ref 30.0–36.0)
MCV: 88.6 fl (ref 78.0–100.0)
MONOS PCT: 5.9 % (ref 3.0–12.0)
Monocytes Absolute: 0.4 10*3/uL (ref 0.1–1.0)
Neutro Abs: 3.3 10*3/uL (ref 1.4–7.7)
Neutrophils Relative %: 53 % (ref 43.0–77.0)
Platelets: 195 10*3/uL (ref 150.0–400.0)
RBC: 4.45 Mil/uL (ref 3.87–5.11)
RDW: 13.8 % (ref 11.5–15.5)
WBC: 6.2 10*3/uL (ref 4.0–10.5)

## 2015-04-10 LAB — BASIC METABOLIC PANEL
BUN: 24 mg/dL — ABNORMAL HIGH (ref 6–23)
CALCIUM: 9.2 mg/dL (ref 8.4–10.5)
CHLORIDE: 108 meq/L (ref 96–112)
CO2: 27 mEq/L (ref 19–32)
Creatinine, Ser: 0.96 mg/dL (ref 0.40–1.20)
GFR: 61.52 mL/min (ref >60.00)
Glucose, Bld: 107 mg/dL — ABNORMAL HIGH (ref 70–99)
Potassium: 5.7 mEq/L — ABNORMAL HIGH (ref 3.5–5.1)
Sodium: 145 mEq/L (ref 135–145)

## 2015-04-10 LAB — HEPATIC FUNCTION PANEL
ALBUMIN: 3.9 g/dL (ref 3.5–5.2)
ALT: 21 U/L (ref 0–35)
AST: 20 U/L (ref 0–37)
Alkaline Phosphatase: 65 U/L (ref 39–117)
BILIRUBIN TOTAL: 0.4 mg/dL (ref 0.2–1.2)
Bilirubin, Direct: 0.1 mg/dL (ref 0.0–0.3)
TOTAL PROTEIN: 7.2 g/dL (ref 6.0–8.3)

## 2015-04-10 LAB — HEMOGLOBIN A1C: Hgb A1c MFr Bld: 5.6 % (ref 4.6–6.5)

## 2015-04-10 LAB — LIPID PANEL
Cholesterol: 218 mg/dL — ABNORMAL HIGH (ref 0–200)
HDL: 60.1 mg/dL (ref >39.00)
LDL Cholesterol: 136 mg/dL — ABNORMAL HIGH (ref 0–99)
NONHDL: 157.9
Total CHOL/HDL Ratio: 4
Triglycerides: 112 mg/dL (ref 0.0–149.0)
VLDL: 22.4 mg/dL (ref 0.0–40.0)

## 2015-04-10 LAB — TSH: TSH: 4.91 u[IU]/mL — ABNORMAL HIGH (ref 0.35–4.50)

## 2015-04-15 ENCOUNTER — Ambulatory Visit (INDEPENDENT_AMBULATORY_CARE_PROVIDER_SITE_OTHER): Payer: PPO | Admitting: Internal Medicine

## 2015-04-15 ENCOUNTER — Encounter: Payer: Self-pay | Admitting: Internal Medicine

## 2015-04-15 VITALS — BP 120/80 | HR 78 | Temp 98.2°F | Resp 20 | Ht 67.5 in | Wt 242.0 lb

## 2015-04-15 DIAGNOSIS — R7989 Other specified abnormal findings of blood chemistry: Secondary | ICD-10-CM | POA: Diagnosis not present

## 2015-04-15 DIAGNOSIS — E875 Hyperkalemia: Secondary | ICD-10-CM | POA: Diagnosis not present

## 2015-04-15 DIAGNOSIS — E038 Other specified hypothyroidism: Secondary | ICD-10-CM

## 2015-04-15 DIAGNOSIS — R7309 Other abnormal glucose: Secondary | ICD-10-CM

## 2015-04-15 DIAGNOSIS — E039 Hypothyroidism, unspecified: Secondary | ICD-10-CM

## 2015-04-15 NOTE — Patient Instructions (Addendum)
Please complete the following lab tests before your next follow up appointment: BMET, A1c - 790.29 TSH, Free T4 - abnormal glucose

## 2015-04-15 NOTE — Progress Notes (Signed)
Pre visit review using our clinic review tool, if applicable. No additional management support is needed unless otherwise documented below in the visit note. 

## 2015-04-15 NOTE — Assessment & Plan Note (Signed)
I suspect she has spurious hyperkalemia.  Repeat BMET.

## 2015-04-15 NOTE — Assessment & Plan Note (Signed)
Patient would like to avoid taking medication.  Her TSH is borderline.  Previous T4 was normal.  Continue to monitor TSH periodically.

## 2015-04-15 NOTE — Assessment & Plan Note (Signed)
Improved with dietary changes.  Continue healthy diet and regular exercise.

## 2015-04-15 NOTE — Progress Notes (Signed)
   Subjective:    Patient ID: [name], female    DOB: 1945/12/16, 69 y.o.   MRN: [id number]  HPI  69 year old white female with history of borderline type 2 diabetes and abnormal TSH for follow-up. Patient has been able to stop using metformin. Her A1c has improved to 5.7 with exercise and dietary changes.  Patient took low-dose levothyroxine and past but she stopped a year ago.  She is not having any overt symptoms of hypothyroidism. She denies any cold intolerance, weight gain, or constipation.  Patient would like to avoid taking any medication.  Recent blood work shows hyperkalemia.  She is not taking NSAIDs.  She drinks plenty of water daily.    Review of Systems Intermittent hot flashes    Past Medical History  Diagnosis Date  . Palpitations   . Chest pain   . Hyperlipidemia   . Borderline diabetes mellitus   . GERD (gastroesophageal reflux disease)   . Weakness   . Grief reaction   . Personal history of urinary calculi   . 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 Marital Status: Married    Spouse Name: N/A  . Number of Children: N/A  . Years of Education: N/A   Occupational History  . Not on file.   Social History Main Topics  . Smoking status: Never Smoker   . Smokeless tobacco: Never Used  . Alcohol Use: No  . Drug Use: No  . Sexual Activity: Not on file   Other Topics Concern  . Not on file   Social History Narrative   Retired Merchandiser, retail for [employer]   Married (husband - Eddie Dibbles), no children   Never Smoked    Alcohol use-no      Daily Caffeine Use 2/day    Past Surgical History  Procedure Laterality Date  . Mass excision      right upper inner thigh or cyst    Family History  Problem Relation Age of Onset  . COPD Mother     deceased 11/17/08  . Alzheimer's disease Father     deceased 09/16/2009  . Coronary artery disease Father     s/p cabg    Allergies    Allergen Reactions  . Atorvastatin     REACTION: Upset stomach    Current Outpatient Prescriptions on File Prior to Visit  Medication Sig Dispense Refill  . aspirin 81 MG tablet Take 81 mg by mouth daily.       No current facility-administered medications on file prior to visit.    BP 120/80 mmHg  Pulse 78  Temp(Src) 98.2 F (36.8 C) (Oral)  Resp 20  Ht 5' 7.5" (1.715 m)  Wt 242 lb (109.77 kg)  BMI 37.32 kg/m2  SpO2 97%    Objective:   Physical Exam  Constitutional: She is oriented to person, place, and time. She appears well-developed and well-nourished.  Cardiovascular: Normal rate, regular rhythm and normal heart sounds.   No murmur heard. Pulmonary/Chest: Effort normal and breath sounds normal. She has no wheezes.  Neurological: She is alert and oriented to person, place, and time. No cranial nerve deficit.  Psychiatric: She has a normal mood and affect. Her behavior is normal.          Assessment & Plan:

## 2015-04-16 LAB — BASIC METABOLIC PANEL
BUN: 22 mg/dL (ref 6–23)
CALCIUM: 9.5 mg/dL (ref 8.4–10.5)
CHLORIDE: 101 meq/L (ref 96–112)
CO2: 26 meq/L (ref 19–32)
Creatinine, Ser: 0.92 mg/dL (ref 0.40–1.20)
GFR: 64.61 mL/min (ref >60.00)
Glucose, Bld: 82 mg/dL (ref 70–99)
POTASSIUM: 4.5 meq/L (ref 3.5–5.1)
Sodium: 136 mEq/L (ref 135–145)

## 2015-04-16 LAB — T4, FREE: FREE T4: 0.94 ng/dL (ref 0.60–1.60)

## 2015-04-17 ENCOUNTER — Telehealth: Payer: Self-pay | Admitting: Internal Medicine

## 2015-04-17 NOTE — Telephone Encounter (Signed)
Copy mailed per patient's request.

## 2015-04-17 NOTE — Telephone Encounter (Signed)
Pt was wondering if Apolonio Schneiders could mail her a copy of her lab results from last Wednesday, and any lab results that she discussed with the pt.

## 2016-01-06 DIAGNOSIS — I83811 Varicose veins of right lower extremities with pain: Secondary | ICD-10-CM | POA: Diagnosis not present

## 2016-01-06 DIAGNOSIS — M79604 Pain in right leg: Secondary | ICD-10-CM | POA: Diagnosis not present

## 2016-01-13 DIAGNOSIS — I8311 Varicose veins of right lower extremity with inflammation: Secondary | ICD-10-CM | POA: Diagnosis not present

## 2016-01-22 DIAGNOSIS — I83811 Varicose veins of right lower extremities with pain: Secondary | ICD-10-CM | POA: Diagnosis not present

## 2016-02-17 DIAGNOSIS — I8311 Varicose veins of right lower extremity with inflammation: Secondary | ICD-10-CM | POA: Diagnosis not present

## 2016-02-17 DIAGNOSIS — I83811 Varicose veins of right lower extremities with pain: Secondary | ICD-10-CM | POA: Diagnosis not present

## 2016-02-17 DIAGNOSIS — I87321 Chronic venous hypertension (idiopathic) with inflammation of right lower extremity: Secondary | ICD-10-CM | POA: Diagnosis not present

## 2016-03-09 DIAGNOSIS — I87321 Chronic venous hypertension (idiopathic) with inflammation of right lower extremity: Secondary | ICD-10-CM | POA: Diagnosis not present

## 2016-03-09 DIAGNOSIS — I8311 Varicose veins of right lower extremity with inflammation: Secondary | ICD-10-CM | POA: Diagnosis not present

## 2016-03-09 DIAGNOSIS — I83811 Varicose veins of right lower extremities with pain: Secondary | ICD-10-CM | POA: Diagnosis not present

## 2016-04-05 DIAGNOSIS — I87321 Chronic venous hypertension (idiopathic) with inflammation of right lower extremity: Secondary | ICD-10-CM | POA: Diagnosis not present

## 2016-04-05 DIAGNOSIS — I83811 Varicose veins of right lower extremities with pain: Secondary | ICD-10-CM | POA: Diagnosis not present

## 2016-04-05 DIAGNOSIS — I8311 Varicose veins of right lower extremity with inflammation: Secondary | ICD-10-CM | POA: Diagnosis not present

## 2016-04-20 DIAGNOSIS — Z803 Family history of malignant neoplasm of breast: Secondary | ICD-10-CM | POA: Diagnosis not present

## 2016-04-20 DIAGNOSIS — Z1231 Encounter for screening mammogram for malignant neoplasm of breast: Secondary | ICD-10-CM | POA: Diagnosis not present

## 2016-04-20 LAB — HM MAMMOGRAPHY

## 2016-04-26 ENCOUNTER — Encounter: Payer: Self-pay | Admitting: Emergency Medicine

## 2016-04-28 DIAGNOSIS — I8311 Varicose veins of right lower extremity with inflammation: Secondary | ICD-10-CM | POA: Diagnosis not present

## 2016-04-28 DIAGNOSIS — Z01419 Encounter for gynecological examination (general) (routine) without abnormal findings: Secondary | ICD-10-CM | POA: Diagnosis not present

## 2016-05-26 DIAGNOSIS — H01009 Unspecified blepharitis unspecified eye, unspecified eyelid: Secondary | ICD-10-CM | POA: Diagnosis not present

## 2016-05-26 DIAGNOSIS — H16229 Keratoconjunctivitis sicca, not specified as Sjogren's, unspecified eye: Secondary | ICD-10-CM | POA: Diagnosis not present

## 2016-06-04 DIAGNOSIS — H00029 Hordeolum internum unspecified eye, unspecified eyelid: Secondary | ICD-10-CM | POA: Diagnosis not present

## 2016-06-04 DIAGNOSIS — H16229 Keratoconjunctivitis sicca, not specified as Sjogren's, unspecified eye: Secondary | ICD-10-CM | POA: Diagnosis not present

## 2016-06-09 DIAGNOSIS — H10021 Other mucopurulent conjunctivitis, right eye: Secondary | ICD-10-CM | POA: Diagnosis not present

## 2016-06-21 ENCOUNTER — Encounter: Payer: Self-pay | Admitting: Internal Medicine

## 2016-06-21 ENCOUNTER — Ambulatory Visit (INDEPENDENT_AMBULATORY_CARE_PROVIDER_SITE_OTHER): Payer: PPO | Admitting: Internal Medicine

## 2016-06-21 VITALS — BP 112/74 | HR 76 | Temp 97.9°F | Resp 16 | Wt 246.0 lb

## 2016-06-21 DIAGNOSIS — R7303 Prediabetes: Secondary | ICD-10-CM

## 2016-06-21 DIAGNOSIS — N1831 Chronic kidney disease, stage 3a: Secondary | ICD-10-CM | POA: Insufficient documentation

## 2016-06-21 DIAGNOSIS — E119 Type 2 diabetes mellitus without complications: Secondary | ICD-10-CM | POA: Insufficient documentation

## 2016-06-21 DIAGNOSIS — Z0000 Encounter for general adult medical examination without abnormal findings: Secondary | ICD-10-CM

## 2016-06-21 DIAGNOSIS — K219 Gastro-esophageal reflux disease without esophagitis: Secondary | ICD-10-CM | POA: Diagnosis not present

## 2016-06-21 DIAGNOSIS — E039 Hypothyroidism, unspecified: Secondary | ICD-10-CM

## 2016-06-21 DIAGNOSIS — E038 Other specified hypothyroidism: Secondary | ICD-10-CM | POA: Diagnosis not present

## 2016-06-21 DIAGNOSIS — Z1159 Encounter for screening for other viral diseases: Secondary | ICD-10-CM

## 2016-06-21 HISTORY — DX: Prediabetes: R73.03

## 2016-06-21 NOTE — Progress Notes (Signed)
Patient received education resource, including the self-management goal and tool. Patient verbalized understanding. 

## 2016-06-21 NOTE — Patient Instructions (Addendum)
Test(s) ordered today. Your results will be released to [place] (or called to you) after review, usually within 72hours after test completion. If any changes need to be made, you will be notified at that same time.  All other Health Maintenance issues reviewed.   All recommended immunizations and age-appropriate screenings are up-to-date or discussed.  No immunizations administered today.   Medications reviewed and updated.  No changes recommended at this time.   Please followup in one year, sooner if needed    Health Maintenance, Female Adopting a healthy lifestyle and getting preventive care can go a long way to promote health and wellness. Talk with your health care provider about what schedule of regular examinations is right for you. This is a good chance for you to check in with your provider about disease prevention and staying healthy. In between checkups, there are plenty of things you can do on your own. Experts have done a lot of research about which lifestyle changes and preventive measures are most likely to keep you healthy. Ask your health care provider for more information. WEIGHT AND DIET  Eat a healthy diet  Be sure to include plenty of vegetables, fruits, low-fat dairy products, and lean protein.  Do not eat a lot of foods high in solid fats, added sugars, or salt.  Get regular exercise. This is one of the most important things you can do for your health.  Most adults should exercise for at least 150 minutes each week. The exercise should increase your heart rate and make you sweat (moderate-intensity exercise).  Most adults should also do strengthening exercises at least twice a week. This is in addition to the moderate-intensity exercise.  Maintain a healthy weight  Body mass index (BMI) is a measurement that can be used to identify possible weight problems. It estimates body fat based on height and weight. Your health care provider can help determine your BMI  and help you achieve or maintain a healthy weight.  For females 96 years of age and older:   A BMI below 18.5 is considered underweight.  A BMI of 18.5 to 24.9 is normal.  A BMI of 25 to 29.9 is considered overweight.  A BMI of 30 and above is considered obese.  Watch levels of cholesterol and blood lipids  You should start having your blood tested for lipids and cholesterol at 68 years of age, then have this test every 5 years.  You may need to have your cholesterol levels checked more often if:  Your lipid or cholesterol levels are high.  You are older than 68 years of age.  You are at high risk for heart disease.  CANCER SCREENING   Lung Cancer  Lung cancer screening is recommended for adults 60-68 years old who are at high risk for lung cancer because of a history of smoking.  A yearly low-dose CT scan of the lungs is recommended for people who:  Currently smoke.  Have quit within the past 15 years.  Have at least a 30-pack-year history of smoking. A pack year is smoking an average of one pack of cigarettes a day for 1 year.  Yearly screening should continue until it has been 15 years since you quit.  Yearly screening should stop if you develop a health problem that would prevent you from having lung cancer treatment.  Breast Cancer  Practice breast self-awareness. This means understanding how your breasts normally appear and feel.  It also means doing regular breast self-exams. Let  health care provider know about any changes, no matter how small.  If you are in your 20s or 30s, you should have a clinical breast exam (CBE) by a health care provider every 1-3 years as part of a regular health exam.  If you are 40 or older, have a CBE every year. Also consider having a breast X-ray (mammogram) every year.  If you have a family history of breast cancer, talk to your health care provider about genetic screening.  If you are at high risk for breast cancer, talk  to your health care provider about having an MRI and a mammogram every year.  Breast cancer gene (BRCA) assessment is recommended for women who have family members with BRCA-related cancers. BRCA-related cancers include:  Breast.  Ovarian.  Tubal.  Peritoneal cancers.  Results of the assessment will determine the need for genetic counseling and BRCA1 and BRCA2 testing. Cervical Cancer Your health care provider may recommend that you be screened regularly for cancer of the pelvic organs (ovaries, uterus, and vagina). This screening involves a pelvic examination, including checking for microscopic changes to the surface of your cervix (Pap test). You may be encouraged to have this screening done every 3 years, beginning at age 21.  For women ages 30-65, health care providers may recommend pelvic exams and Pap testing every 3 years, or they may recommend the Pap and pelvic exam, combined with testing for human papilloma virus (HPV), every 5 years. Some types of HPV increase your risk of cervical cancer. Testing for HPV may also be done on women of any age with unclear Pap test results.  Other health care providers may not recommend any screening for nonpregnant women who are considered low risk for pelvic cancer and who do not have symptoms. Ask your health care provider if a screening pelvic exam is right for you.  If you have had past treatment for cervical cancer or a condition that could lead to cancer, you need Pap tests and screening for cancer for at least 20 years after your treatment. If Pap tests have been discontinued, your risk factors (such as having a new sexual partner) need to be reassessed to determine if screening should resume. Some women have medical problems that increase the chance of getting cervical cancer. In these cases, your health care provider may recommend more frequent screening and Pap tests. Colorectal Cancer  This type of cancer can be detected and often  prevented.  Routine colorectal cancer screening usually begins at 68 years of age and continues through 68 years of age.  Your health care provider may recommend screening at an earlier age if you have risk factors for colon cancer.  Your health care provider may also recommend using home test kits to check for hidden blood in the stool.  A small camera at the end of a tube can be used to examine your colon directly (sigmoidoscopy or colonoscopy). This is done to check for the earliest forms of colorectal cancer.  Routine screening usually begins at age 50.  Direct examination of the colon should be repeated every 5-10 years through 68 years of age. However, you may need to be screened more often if early forms of precancerous polyps or small growths are found. Skin Cancer  Check your skin from head to toe regularly.  Tell your health care provider about any new moles or changes in moles, especially if there is a change in a mole's shape or color.  Also tell your health   care provider if you have a mole that is larger than the size of a pencil eraser.  Always use sunscreen. Apply sunscreen liberally and repeatedly throughout the day.  Protect yourself by wearing long sleeves, pants, a wide-brimmed hat, and sunglasses whenever you are outside. HEART DISEASE, DIABETES, AND HIGH BLOOD PRESSURE   High blood pressure causes heart disease and increases the risk of stroke. High blood pressure is more likely to develop in:  People who have blood pressure in the high end of the normal range (130-139/85-89 mm Hg).  People who are overweight or obese.  People who are African American.  If you are 18-39 years of age, have your blood pressure checked every 3-5 years. If you are 40 years of age or older, have your blood pressure checked every year. You should have your blood pressure measured twice--once when you are at a hospital or clinic, and once when you are not at a hospital or clinic.  Record the average of the two measurements. To check your blood pressure when you are not at a hospital or clinic, you can use:  An automated blood pressure machine at a pharmacy.  A home blood pressure monitor.  If you are between 55 years and 79 years old, ask your health care provider if you should take aspirin to prevent strokes.  Have regular diabetes screenings. This involves taking a blood sample to check your fasting blood sugar level.  If you are at a normal weight and have a low risk for diabetes, have this test once every three years after 68 years of age.  If you are overweight and have a high risk for diabetes, consider being tested at a younger age or more often. PREVENTING INFECTION  Hepatitis B  If you have a higher risk for hepatitis B, you should be screened for this virus. You are considered at high risk for hepatitis B if:  You were born in a country where hepatitis B is common. Ask your health care provider which countries are considered high risk.  Your parents were born in a high-risk country, and you have not been immunized against hepatitis B (hepatitis B vaccine).  You have HIV or AIDS.  You use needles to inject street drugs.  You live with someone who has hepatitis B.  You have had sex with someone who has hepatitis B.  You get hemodialysis treatment.  You take certain medicines for conditions, including cancer, organ transplantation, and autoimmune conditions. Hepatitis C  Blood testing is recommended for:  Everyone born from 1945 through 1965.  Anyone with known risk factors for hepatitis C. Sexually transmitted infections (STIs)  You should be screened for sexually transmitted infections (STIs) including gonorrhea and chlamydia if:  You are sexually active and are younger than 68 years of age.  You are older than 68 years of age and your health care provider tells you that you are at risk for this type of infection.  Your sexual activity  has changed since you were last screened and you are at an increased risk for chlamydia or gonorrhea. Ask your health care provider if you are at risk.  If you do not have HIV, but are at risk, it may be recommended that you take a prescription medicine daily to prevent HIV infection. This is called pre-exposure prophylaxis (PrEP). You are considered at risk if:  You are sexually active and do not regularly use condoms or know the HIV status of your partner(s).  You take   take drugs by injection.  You are sexually active with a partner who has HIV. Talk with your health care provider about whether you are at high risk of being infected with HIV. If you choose to begin PrEP, you should first be tested for HIV. You should then be tested every 3 months for as long as you are taking PrEP.  PREGNANCY   If you are premenopausal and you may become pregnant, ask your health care provider about preconception counseling.  If you may become pregnant, take 400 to 800 micrograms (mcg) of folic acid every day.  If you want to prevent pregnancy, talk to your health care provider about birth control (contraception). OSTEOPOROSIS AND MENOPAUSE   Osteoporosis is a disease in which the bones lose minerals and strength with aging. This can result in serious bone fractures. Your risk for osteoporosis can be identified using a bone density scan.  If you are 64 years of age or older, or if you are at risk for osteoporosis and fractures, ask your health care provider if you should be screened.  Ask your health care provider whether you should take a calcium or vitamin D supplement to lower your risk for osteoporosis.  Menopause may have certain physical symptoms and risks.  Hormone replacement therapy may reduce some of these symptoms and risks. Talk to your health care provider about whether hormone replacement therapy is right for you.  HOME CARE INSTRUCTIONS   Schedule regular health, dental, and eye  exams.  Stay current with your immunizations.   Do not use any tobacco products including cigarettes, chewing tobacco, or electronic cigarettes.  If you are pregnant, do not drink alcohol.  If you are breastfeeding, limit how much and how often you drink alcohol.  Limit alcohol intake to no more than 1 drink per day for nonpregnant women. One drink equals 12 ounces of beer, 5 ounces of wine, or 1 ounces of hard liquor.  Do not use street drugs.  Do not share needles.  Ask your health care provider for help if you need support or information about quitting drugs.  Tell your health care provider if you often feel depressed.  Tell your health care provider if you have ever been abused or do not feel safe at home.   This information is not intended to replace advice given to you by your health care provider. Make sure you discuss any questions you have with your health care provider.   Document Released: 03/28/2011 Document Revised: 10/03/2014 Document Reviewed: 08/14/2013 Elsevier Interactive Patient Education [employer].

## 2016-06-21 NOTE — Progress Notes (Signed)
Subjective:    Patient ID: [name], female    DOB: Feb 13, 1946, 70 y.o.   MRN: [id number]  HPI The patient is here for follow up. She is here for a physical exam.   She has no concerns regarding her own health - just wants to make sure she is healthy.  Her husband was diagnosed with metastatic lung cancer one month ago.  This has caused a lot of increased stress.    Medications and allergies reviewed with patient and updated if appropriate.  Patient Active Problem List   Diagnosis Date Noted  . Prediabetes 06/21/2016  . Hyperkalemia 04/15/2015  . Preventative health care 07/09/2013  . Subclinical hypothyroidism 05/17/2011  . ANXIETY 01/17/2009  . GERD 01/17/2009  . PALPITATIONS 01/17/2009  . SHORTNESS OF BREATH 01/17/2009  . CHEST PAIN 01/17/2009  . GRIEF REACTION 12/08/2008  . WEAKNESS 12/08/2008  . THROAT PAIN 07/17/2008  . DERMATITIS 01/14/2008  . HYPERLIPIDEMIA 12/03/2007  . Abnormal glucose 12/03/2007  . NEPHROLITHIASIS, HX OF 12/03/2007    Current Outpatient Prescriptions on File Prior to Visit  Medication Sig Dispense Refill  . aspirin 81 MG tablet Take 81 mg by mouth daily.       No current facility-administered medications on file prior to visit.     Past Medical History:  Diagnosis Date  . Borderline diabetes mellitus   . Chest pain   . GERD (gastroesophageal reflux disease)   . Grief reaction   . Hyperlipidemia   . Palpitations   . Personal history of urinary calculi   . 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 Weakness     Past Surgical History:  Procedure Laterality Date  . MASS EXCISION     right upper inner thigh or cyst    Social History   Social History  . Marital status: Married    Spouse name: N/A  . Number of children: 0  . Years of education: N/A   Social History Main Topics  . Smoking status: Never Smoker  . Smokeless tobacco: Never Used  . Alcohol use No  . Drug use: No  . Sexual activity:  Not Asked   Other Topics Concern  . None   Social History Narrative   Retired Merchandiser, retail for [employer]   Married (husband - Eddie Dibbles), no children   Never Smoked    Alcohol use-no      Daily Caffeine Use 2/day    Family History  Problem Relation Age of Onset  . COPD Mother     deceased 10/11/2008  . Alzheimer's disease Father     deceased 2009-08-11  . Coronary artery disease Father     s/p cabg    Review of Systems  Constitutional: Negative for chills and fever.  Eyes: Negative for visual disturbance.  Respiratory: Negative for cough, shortness of breath and wheezing.   Cardiovascular: Negative for chest pain, palpitations and leg swelling.  Gastrointestinal: Positive for constipation (occasional). Negative for abdominal pain, blood in stool, diarrhea and nausea.       Jerrye Bushy - improved with diet changes  Genitourinary: Negative for dysuria and hematuria.  Musculoskeletal: Negative for arthralgias and back pain.  Skin: Negative for color change and rash.  Neurological: Negative for light-headedness and headaches.  Psychiatric/Behavioral: Positive for dysphoric mood. The patient is nervous/anxious (related to husband's lung cancer).        Objective:   Vitals:   06/21/16 1455  BP: 112/74  Pulse: 76  Resp: 16  Temp: 97.9 F (36.6 C)   Filed Weights   06/21/16 1455  Weight: 246 lb (111.6 kg)   Body mass index is 37.96 kg/m.   Physical Exam    Constitutional: She appears well-developed and well-nourished. No distress.  HENT:  Head: Normocephalic and atraumatic.  Right Ear: External ear normal. Normal ear canal and TM Left Ear: External ear normal.  Normal ear canal and TM Mouth/Throat: Oropharynx is clear and moist.  Eyes: Conjunctivae and EOM are normal.  Neck: Neck supple. No tracheal deviation present. No thyromegaly present.  No carotid bruit  Cardiovascular: Normal rate, regular rhythm and normal heart sounds.   No murmur heard.  No  edema. Pulmonary/Chest: Effort normal and breath sounds normal. No respiratory distress. She has no wheezes. She has no rales.  Breast: deferred to Gyn Abdominal: Soft. She exhibits no distension. There is no tenderness.  Lymphadenopathy: She has no cervical adenopathy.  Skin: Skin is warm and dry. She is not diaphoretic.  Psychiatric: She has a normal mood and affect. Her behavior is normal.      Assessment & Plan:    Physical exam: Screening blood work  ordered Immunizations  Flu vaccine done two weeks ago,   Discussed other vaccine she should consider Colonoscopy  Up to date  Mammogram   Up to date  Gyn  Up to date  Dexa   Up to date  Eye exams  Up to date  Exercise  none Weight - discussed weight loss, but given stress at this time I do not expect her to lose weight Skin  No concerns, sees Dr Martinique Substance abuse  none  See Problem List for Assessment and Plan of chronic medical problems.  F/u annually

## 2016-06-21 NOTE — Assessment & Plan Note (Signed)
a1c

## 2016-06-21 NOTE — Assessment & Plan Note (Signed)
Improved with diet changes Takes prilosec as needed

## 2016-06-21 NOTE — Assessment & Plan Note (Signed)
tsh

## 2016-06-22 ENCOUNTER — Other Ambulatory Visit (INDEPENDENT_AMBULATORY_CARE_PROVIDER_SITE_OTHER): Payer: PPO

## 2016-06-22 DIAGNOSIS — R7303 Prediabetes: Secondary | ICD-10-CM | POA: Diagnosis not present

## 2016-06-22 DIAGNOSIS — Z0000 Encounter for general adult medical examination without abnormal findings: Secondary | ICD-10-CM | POA: Diagnosis not present

## 2016-06-22 DIAGNOSIS — Z1159 Encounter for screening for other viral diseases: Secondary | ICD-10-CM | POA: Diagnosis not present

## 2016-06-22 LAB — COMPREHENSIVE METABOLIC PANEL
ALBUMIN: 3.4 g/dL — AB (ref 3.5–5.2)
ALK PHOS: 60 U/L (ref 39–117)
ALT: 17 U/L (ref 0–35)
AST: 17 U/L (ref 0–37)
BUN: 13 mg/dL (ref 6–23)
CALCIUM: 8.7 mg/dL (ref 8.4–10.5)
CHLORIDE: 105 meq/L (ref 96–112)
CO2: 31 mEq/L (ref 19–32)
CREATININE: 0.87 mg/dL (ref 0.40–1.20)
GFR: 68.67 mL/min (ref >60.00)
Glucose, Bld: 94 mg/dL (ref 70–99)
POTASSIUM: 4.7 meq/L (ref 3.5–5.1)
Sodium: 140 mEq/L (ref 135–145)
TOTAL PROTEIN: 6.7 g/dL (ref 6.0–8.3)
Total Bilirubin: 0.4 mg/dL (ref 0.2–1.2)

## 2016-06-22 LAB — CBC WITH DIFFERENTIAL/PLATELET
BASOS PCT: 0.7 % (ref 0.0–3.0)
Basophils Absolute: 0 10*3/uL (ref 0.0–0.1)
EOS PCT: 1.3 % (ref 0.0–5.0)
Eosinophils Absolute: 0.1 10*3/uL (ref 0.0–0.7)
HEMATOCRIT: 36.5 % (ref 36.0–46.0)
HEMOGLOBIN: 12.5 g/dL (ref 12.0–15.0)
LYMPHS PCT: 33.8 % (ref 12.0–46.0)
Lymphs Abs: 1.9 10*3/uL (ref 0.7–4.0)
MCHC: 34.3 g/dL (ref 30.0–36.0)
MCV: 87.8 fl (ref 78.0–100.0)
Monocytes Absolute: 0.4 10*3/uL (ref 0.1–1.0)
Monocytes Relative: 6.7 % (ref 3.0–12.0)
Neutro Abs: 3.3 10*3/uL (ref 1.4–7.7)
Neutrophils Relative %: 57.5 % (ref 43.0–77.0)
Platelets: 202 10*3/uL (ref 150.0–400.0)
RBC: 4.16 Mil/uL (ref 3.87–5.11)
RDW: 13.3 % (ref 11.5–15.5)
WBC: 5.8 10*3/uL (ref 4.0–10.5)

## 2016-06-22 LAB — HEMOGLOBIN A1C: Hgb A1c MFr Bld: 5.6 % (ref 4.6–6.5)

## 2016-06-22 LAB — LIPID PANEL
CHOLESTEROL: 194 mg/dL (ref 0–200)
HDL: 57.9 mg/dL (ref >39.00)
LDL CALC: 111 mg/dL — AB (ref 0–99)
NonHDL: 135.63
TRIGLYCERIDES: 123 mg/dL (ref 0.0–149.0)
Total CHOL/HDL Ratio: 3
VLDL: 24.6 mg/dL (ref 0.0–40.0)

## 2016-06-22 LAB — TSH: TSH: 2.81 u[IU]/mL (ref 0.35–4.50)

## 2016-06-22 LAB — HEPATITIS C ANTIBODY: HCV AB: NEGATIVE

## 2016-06-24 ENCOUNTER — Encounter: Payer: Self-pay | Admitting: Emergency Medicine

## 2016-08-02 ENCOUNTER — Ambulatory Visit: Payer: PPO | Admitting: Internal Medicine

## 2016-08-10 ENCOUNTER — Encounter: Payer: Self-pay | Admitting: Nurse Practitioner

## 2016-08-10 ENCOUNTER — Ambulatory Visit (INDEPENDENT_AMBULATORY_CARE_PROVIDER_SITE_OTHER): Payer: PPO | Admitting: Nurse Practitioner

## 2016-08-10 ENCOUNTER — Ambulatory Visit (INDEPENDENT_AMBULATORY_CARE_PROVIDER_SITE_OTHER)
Admission: RE | Admit: 2016-08-10 | Discharge: 2016-08-10 | Disposition: A | Discharge status: Home / Self Care | Payer: PPO | Source: Ambulatory Visit | Attending: Nurse Practitioner | Admitting: Nurse Practitioner

## 2016-08-10 VITALS — BP 138/78 | HR 68 | Temp 97.6°F | Ht 67.5 in | Wt 229.0 lb

## 2016-08-10 DIAGNOSIS — M549 Dorsalgia, unspecified: Secondary | ICD-10-CM | POA: Diagnosis not present

## 2016-08-10 DIAGNOSIS — M546 Pain in thoracic spine: Secondary | ICD-10-CM | POA: Diagnosis not present

## 2016-08-10 DIAGNOSIS — R109 Unspecified abdominal pain: Secondary | ICD-10-CM

## 2016-08-10 DIAGNOSIS — Z87442 Personal history of urinary calculi: Secondary | ICD-10-CM

## 2016-08-10 LAB — POCT URINALYSIS DIPSTICK
BILIRUBIN UA: NEGATIVE
Glucose, UA: NEGATIVE
Ketones, UA: NEGATIVE
LEUKOCYTES UA: NEGATIVE
NITRITE UA: NEGATIVE
PH UA: 6
Protein, UA: NEGATIVE
RBC UA: NEGATIVE
Spec Grav, UA: 1.02
UROBILINOGEN UA: 0.2

## 2016-08-10 MED ORDER — CYCLOBENZAPRINE HCL 10 MG PO TABS: 10.0000 mg | ORAL_TABLET | Freq: Every day | ORAL | 0 refills | Status: DC

## 2016-08-10 MED ORDER — NAPROXEN 500 MG PO TABS: 500.0000 mg | ORAL_TABLET | Freq: Two times a day (BID) | ORAL | 0 refills | Status: DC

## 2016-08-10 NOTE — Progress Notes (Signed)
Pre visit review using our clinic review tool, if applicable. No additional management support is needed unless otherwise documented below in the visit note. 

## 2016-08-10 NOTE — Progress Notes (Signed)
Subjective:  Patient ID: Nino Glow, female    DOB: 03/07/1946  Age: 70 y.o. MRN: [id number]  CC: Back Pain (Pt stated upper back and left side of the dominal for 2 months.)   Back Pain  This is a new problem. The current episode started 1 to 4 weeks ago. The problem occurs intermittently. The problem is unchanged. The pain is present in the thoracic spine. The quality of the pain is described as cramping and aching. The pain does not radiate. The pain is the same all the time. The symptoms are aggravated by twisting and stress. Stiffness is present all day. Pertinent negatives include no abdominal pain, bladder incontinence, bowel incontinence, chest pain, dysuria, fever, headaches, numbness, paresis, paresthesias, tingling, weakness or weight loss. Risk factors include obesity, menopause and poor posture (repetitive lifting and pushing of sick husband at home). She has tried NSAIDs for the symptoms. The treatment provided mild relief.  she is concerned pain may be related to cancer, hence requesting for x-ray to be done.  Outpatient Medications Prior to Visit  Medication Sig Dispense Refill  . aspirin 81 MG tablet Take 81 mg by mouth daily.       No facility-administered medications prior to visit.     ROS See HPI  Objective:  BP 138/78 (BP Location: Left Arm, Patient Position: Sitting, Cuff Size: Large)   Pulse 68   Temp 97.6 F (36.4 C)   Ht 5' 7.5" (1.715 m)   Wt 229 lb (103.9 kg)   SpO2 98%   BMI 35.34 kg/m   BP Readings from Last 3 Encounters:  08/10/16 138/78  06/21/16 112/74  04/15/15 120/80    Wt Readings from Last 3 Encounters:  08/10/16 229 lb (103.9 kg)  06/21/16 246 lb (111.6 kg)  04/15/15 242 lb (109.8 kg)    Physical Exam  Constitutional: She is oriented to person, place, and time.  Neck: Normal range of motion. Neck supple.  Cardiovascular: Normal rate, regular rhythm and normal heart sounds.   Pulmonary/Chest: Effort normal and breath sounds  normal. No respiratory distress. She exhibits no tenderness.  Abdominal: Soft. Bowel sounds are normal. She exhibits no distension. There is no tenderness.  Musculoskeletal: She exhibits no edema.       Right shoulder: Normal.       Left shoulder: Normal.       Cervical back: Normal.       Thoracic back: She exhibits tenderness and pain. She exhibits no bony tenderness, no edema, no spasm and normal pulse.  Right thoracic paraspinal muscle tenderness.  Lymphadenopathy:    She has no cervical adenopathy.  Neurological: She is alert and oriented to person, place, and time.  Skin: Skin is warm and dry. No rash noted. No erythema.  Vitals reviewed.   Lab Results  Component Value Date   WBC 5.8 06/22/2016   HGB 12.5 06/22/2016   HCT 36.5 06/22/2016   PLT 202.0 06/22/2016   GLUCOSE 94 06/22/2016   CHOL 194 06/22/2016   TRIG 123.0 06/22/2016   HDL 57.90 06/22/2016   LDLDIRECT 152.1 06/26/2013   LDLCALC 111 (H) 06/22/2016   ALT 17 06/22/2016   AST 17 06/22/2016   NA 140 06/22/2016   K 4.7 06/22/2016   CL 105 06/22/2016   CREATININE 0.87 06/22/2016   BUN 13 06/22/2016   CO2 31 06/22/2016   TSH 2.81 06/22/2016   HGBA1C 5.6 06/22/2016   MICROALBUR 0.9 06/26/2013    No results found.  Assessment & Plan:   Mykaela was seen today for back pain.  Diagnoses and all orders for this visit:  Acute midline thoracic back pain -     DG Thoracic Spine 2 View; Future -     cyclobenzaprine (FLEXERIL) 10 MG tablet; Take 1 tablet (10 mg total) by mouth at bedtime. -     naproxen (NAPROSYN) 500 MG tablet; Take 1 tablet (500 mg total) by mouth 2 (two) times daily with a meal.  NEPHROLITHIASIS, HX OF -     POCT urinalysis dipstick  Acute right flank pain -     POCT urinalysis dipstick   I am having Ms. [name] start on cyclobenzaprine and naproxen. I am also having her maintain her aspirin.  Meds ordered this encounter  Medications  . cyclobenzaprine (FLEXERIL) 10 MG tablet    Sig:  Take 1 tablet (10 mg total) by mouth at bedtime.    Dispense:  14 tablet    Refill:  0    Order Specific Question:   Supervising Provider    Answer:   Cassandria Anger [[id number]]  . naproxen (NAPROSYN) 500 MG tablet    Sig: Take 1 tablet (500 mg total) by mouth 2 (two) times daily with a meal.    Dispense:  20 tablet    Refill:  0    Order Specific Question:   Supervising Provider    Answer:   Cassandria Anger [[id number]]    Follow-up: Return if symptoms worsen or fail to improve.  Wilfred Lacy, NP

## 2016-08-10 NOTE — Progress Notes (Signed)
Reviewed with patient in office. See office note

## 2016-08-10 NOTE — Patient Instructions (Addendum)
X-ray indicates DDD of Thoracic spine. This can be managed with NSAID and muscle relaxant. Will consider oral prednisone if no improvement in 1week. No hematuria noted in urinalysis.  Muscle Strain A muscle strain (pulled muscle) happens when a muscle is stretched beyond normal length. It happens when a sudden, violent force stretches your muscle too far. Usually, a few of the fibers in your muscle are torn. Muscle strain is common in athletes. Recovery usually takes 1-2 weeks. Complete healing takes 5-6 weeks. Follow these instructions at home:  Follow the PRICE method of treatment to help your injury get better. Do this the first 2-3 days after the injury:  Protect. Protect the muscle to keep it from getting injured again.  Rest. Limit your activity and rest the injured body part.  Ice. Put ice in a plastic bag. Place a towel between your skin and the bag. Then, apply the ice and leave it on from 15-20 minutes each hour. After the third day, switch to moist heat packs.  Compression. Use a splint or elastic bandage on the injured area for comfort. Do not put it on too tightly.  Elevate. Keep the injured body part above the level of your heart.  Only take medicine as told by your doctor.  Warm up before doing exercise to prevent future muscle strains. Contact a doctor if:  You have more pain or puffiness (swelling) in the injured area.  You feel numbness, tingling, or notice a loss of strength in the injured area. This information is not intended to replace advice given to you by your health care provider. Make sure you discuss any questions you have with your health care provider. Document Released: 06/21/2008 Document Revised: 02/18/2016 Document Reviewed: 04/11/2013 Elsevier Interactive Patient Education  2017 [employer].

## 2016-09-02 ENCOUNTER — Telehealth: Payer: Self-pay | Admitting: Internal Medicine

## 2016-09-02 DIAGNOSIS — R634 Abnormal weight loss: Secondary | ICD-10-CM

## 2016-09-02 NOTE — Telephone Encounter (Signed)
Please advise 

## 2016-09-02 NOTE — Telephone Encounter (Signed)
Patient states her husband has cancer and not long left to live.  Patient has lost 23 pounds in three months, her thighs are burning and she states she has had other symptoms like her husband.  She would like a call back.  She is requesting to be seen today for a scan to see if she has cancer.  Please call back as soon as possible.

## 2016-09-02 NOTE — Telephone Encounter (Signed)
Called home number 5pm Friday - got VM - did not leave a message  Lets get her in next week asap.

## 2016-09-05 NOTE — Telephone Encounter (Signed)
I ordered some basic blood work.  If she is having any concerning symptoms besides weight loss she should let  us know so I can order additional blood work.

## 2016-09-05 NOTE — Telephone Encounter (Signed)
Pt states that her husband was just discharged from the hospital this weekend and is in the middle getting Hospice in the home. She is unsure that she could get in the office early this week, she has an appt on 12/20. Would you like any blood work done before her appt to test for cancer? Please advise.

## 2016-09-06 ENCOUNTER — Other Ambulatory Visit (INDEPENDENT_AMBULATORY_CARE_PROVIDER_SITE_OTHER): Payer: PPO

## 2016-09-06 DIAGNOSIS — R634 Abnormal weight loss: Secondary | ICD-10-CM

## 2016-09-06 LAB — CBC WITH DIFFERENTIAL/PLATELET
BASOS ABS: 0 10*3/uL (ref 0.0–0.1)
Basophils Relative: 0.6 % (ref 0.0–3.0)
EOS PCT: 1 % (ref 0.0–5.0)
Eosinophils Absolute: 0.1 10*3/uL (ref 0.0–0.7)
HCT: 35.5 % — ABNORMAL LOW (ref 36.0–46.0)
HEMOGLOBIN: 12 g/dL (ref 12.0–15.0)
LYMPHS ABS: 2.6 10*3/uL (ref 0.7–4.0)
Lymphocytes Relative: 34.1 % (ref 12.0–46.0)
MCHC: 33.9 g/dL (ref 30.0–36.0)
MCV: 88.4 fl (ref 78.0–100.0)
MONO ABS: 0.4 10*3/uL (ref 0.1–1.0)
Monocytes Relative: 5.9 % (ref 3.0–12.0)
NEUTROS PCT: 58.4 % (ref 43.0–77.0)
Neutro Abs: 4.4 10*3/uL (ref 1.4–7.7)
Platelets: 198 10*3/uL (ref 150.0–400.0)
RBC: 4.01 Mil/uL (ref 3.87–5.11)
RDW: 13.1 % (ref 11.5–15.5)
WBC: 7.5 10*3/uL (ref 4.0–10.5)

## 2016-09-06 LAB — COMPREHENSIVE METABOLIC PANEL
ALBUMIN: 3.9 g/dL (ref 3.5–5.2)
ALK PHOS: 54 U/L (ref 39–117)
ALT: 16 U/L (ref 0–35)
AST: 16 U/L (ref 0–37)
BILIRUBIN TOTAL: 0.4 mg/dL (ref 0.2–1.2)
BUN: 15 mg/dL (ref 6–23)
CALCIUM: 9 mg/dL (ref 8.4–10.5)
CO2: 30 mEq/L (ref 19–32)
Chloride: 102 mEq/L (ref 96–112)
Creatinine, Ser: 0.88 mg/dL (ref 0.40–1.20)
GFR: 67.73 mL/min (ref >60.00)
Glucose, Bld: 119 mg/dL — ABNORMAL HIGH (ref 70–99)
POTASSIUM: 3.8 meq/L (ref 3.5–5.1)
Sodium: 139 mEq/L (ref 135–145)
TOTAL PROTEIN: 6.7 g/dL (ref 6.0–8.3)

## 2016-09-06 NOTE — Telephone Encounter (Signed)
Spoke with pt to inform blood work was entered.

## 2016-09-07 LAB — TSH: TSH: 1.89 u[IU]/mL (ref 0.35–4.50)

## 2016-09-09 ENCOUNTER — Ambulatory Visit (INDEPENDENT_AMBULATORY_CARE_PROVIDER_SITE_OTHER): Payer: PPO | Admitting: Internal Medicine

## 2016-09-09 ENCOUNTER — Ambulatory Visit (INDEPENDENT_AMBULATORY_CARE_PROVIDER_SITE_OTHER)
Admission: RE | Admit: 2016-09-09 | Discharge: 2016-09-09 | Disposition: A | Discharge status: Home / Self Care | Payer: PPO | Source: Ambulatory Visit | Attending: Internal Medicine | Admitting: Internal Medicine

## 2016-09-09 VITALS — BP 118/76 | HR 78 | Temp 98.0°F | Resp 16 | Wt 223.0 lb

## 2016-09-09 DIAGNOSIS — R634 Abnormal weight loss: Secondary | ICD-10-CM | POA: Diagnosis not present

## 2016-09-09 DIAGNOSIS — F418 Other specified anxiety disorders: Secondary | ICD-10-CM | POA: Diagnosis not present

## 2016-09-09 DIAGNOSIS — M546 Pain in thoracic spine: Secondary | ICD-10-CM

## 2016-09-09 DIAGNOSIS — F329 Major depressive disorder, single episode, unspecified: Secondary | ICD-10-CM

## 2016-09-09 DIAGNOSIS — F419 Anxiety disorder, unspecified: Secondary | ICD-10-CM

## 2016-09-09 NOTE — Progress Notes (Signed)
Subjective:    Patient ID: [name], female    DOB: [birth year], 70 y.o.   MRN: [id number]  HPI She is here for an acute visit.   Loss of weight:  Her husband has metastatic lung cancer.  He was just in the hospital for two weeks for pneumonia.  His treatment will be stopped per oncology.  She is not sleeping well and has had a lot of stress over the past three months.  She has not been eating as much.  When he was in and out of the hospital for a hip fracture and most recently for PNA she was barely eating.  She often does not have time to eat at home.  She is lifting him a lot when caring for him.  She does have back pain and came in for an xray - she was concerned she may have cancer too.  She understands it may just be from doing everything she is doing for her husband.  She is starting to eat more now.     Wt Readings from Last 3 Encounters:  09/09/16 223 lb (101.2 kg)  08/10/16 229 lb (103.9 kg)  06/21/16 246 lb (111.6 kg)   Back pain:  She has mid back pain - near her bra line.  It hurts more when she is active and stressed more.  The xray she had last month was normal.  She still has pain there and is concerned it is coming from her lungs.  She is scared she has lung cancer like her husband.    We reviewed her blood work.  She just wants to make sure she does not have cancer.   Medications and allergies reviewed with patient and updated if appropriate.  Patient Active Problem List   Diagnosis Date Noted  . Prediabetes 06/21/2016  . Subclinical hypothyroidism 05/17/2011  . ANXIETY 01/17/2009  . GERD 01/17/2009  . HYPERLIPIDEMIA 12/03/2007  . NEPHROLITHIASIS, HX OF 12/03/2007    Current Outpatient Prescriptions on File Prior to Visit  Medication Sig Dispense Refill  . aspirin 81 MG tablet Take 81 mg by mouth daily.       No current facility-administered medications on file prior to visit.     Past Medical History:  Diagnosis Date  . Borderline diabetes mellitus    . Chest pain   . GERD (gastroesophageal reflux disease)   . Grief reaction   . Hyperlipidemia   . Palpitations   . Personal history of urinary calculi   . 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 Weakness     Past Surgical History:  Procedure Laterality Date  . MASS EXCISION     right upper inner thigh or cyst    Social History   Social History  . Marital status: Married    Spouse name: N/A  . Number of children: 0  . Years of education: N/A   Social History Main Topics  . Smoking status: Never Smoker  . Smokeless tobacco: Never Used  . Alcohol use No  . Drug use: No  . Sexual activity: Not on file   Other Topics Concern  . Not on file   Social History Narrative   Retired Merchandiser, retail for [employer]   Married (husband - Eddie Dibbles), no children   Never Smoked    Alcohol use-no      Daily Caffeine Use 2/day    Family History  Problem Relation  Age of Onset  . COPD Mother     deceased Oct 28, 2008  . Alzheimer's disease Father     deceased 08/27/09  . Coronary artery disease Father     s/p cabg    Review of Systems  Constitutional: Positive for appetite change (was low - starting to get a little better) and unexpected weight change. Negative for fever.  Respiratory: Negative for cough, shortness of breath and wheezing.   Cardiovascular: Positive for palpitations (stress related). Negative for chest pain.  Gastrointestinal: Positive for constipation. Negative for abdominal pain, blood in stool and diarrhea.  Genitourinary: Negative for dysuria and hematuria.  Musculoskeletal: Positive for back pain (right mid back).       Foot pain  Neurological: Negative for light-headedness and headaches.       Objective:   Vitals:   09/09/16 1616  BP: 118/76  Pulse: 78  Resp: 16  Temp: 98 F (36.7 C)   Filed Weights   09/09/16 1616  Weight: 223 lb (101.2 kg)   Body mass index is 34.41 kg/m.   Physical Exam Constitutional:  Appears well-developed and well-nourished. No distress.  HENT:  Head: Normocephalic and atraumatic.  Neck: Neck supple. No tracheal deviation present. No thyromegaly present.  No cervical lymphadenopathy Cardiovascular: Normal rate, regular rhythm and normal heart sounds.   No murmur heard. No carotid bruit .  No edema Pulmonary/Chest: Effort normal and breath sounds normal. No respiratory distress. No has no wheezes. No rales.  Msk: mild discomfort right mid back, no spine tenderness Skin: Skin is warm and dry. Not diaphoretic.  Psychiatric: depressed and anxious mood and affect. Behavior is normal.         Assessment & Plan:   See Problem List for Assessment and Plan of chronic medical problems.

## 2016-09-09 NOTE — Progress Notes (Signed)
Pre visit review using our clinic review tool, if applicable. No additional management support is needed unless otherwise documented below in the visit note. 

## 2016-09-09 NOTE — Patient Instructions (Signed)
Have the chest xray today.  We will call you with the results  Make sure you are eating.  Monitor your weight.

## 2016-09-11 ENCOUNTER — Encounter: Payer: Self-pay | Admitting: Internal Medicine

## 2016-09-11 DIAGNOSIS — F419 Anxiety disorder, unspecified: Secondary | ICD-10-CM | POA: Insufficient documentation

## 2016-09-11 DIAGNOSIS — F329 Major depressive disorder, single episode, unspecified: Secondary | ICD-10-CM | POA: Insufficient documentation

## 2016-09-11 DIAGNOSIS — R634 Abnormal weight loss: Secondary | ICD-10-CM | POA: Insufficient documentation

## 2016-09-11 DIAGNOSIS — F32A Depression, unspecified: Secondary | ICD-10-CM | POA: Insufficient documentation

## 2016-09-11 DIAGNOSIS — M546 Pain in thoracic spine: Secondary | ICD-10-CM | POA: Insufficient documentation

## 2016-09-11 HISTORY — DX: Depression, unspecified: F32.A

## 2016-09-11 HISTORY — DX: Anxiety disorder, unspecified: F41.9

## 2016-09-11 NOTE — Assessment & Plan Note (Signed)
Related to excessive stress/depression and caring for her husband who has stage 4 lung cancer and is on hospice Discussed the importance of taking time to eat - this is a priority and she needs to take care of herself to care for him She has been eating more recently and with him being home now I think her eating will improve Will need to monitor Stressed she does not have cancer

## 2016-09-11 NOTE — Assessment & Plan Note (Signed)
Thoracic spine xray normal Will get a lung xray today to reassure her she does not have cancer Treat pain with otc pain meds as needed

## 2016-09-11 NOTE — Assessment & Plan Note (Signed)
Related to her husband cancer - he is now on hospice at home Discussed medication She is doing fairly well given the circumstances Consider medication at any time to help her through this

## 2016-09-14 ENCOUNTER — Ambulatory Visit: Payer: PPO | Admitting: Internal Medicine

## 2016-10-20 ENCOUNTER — Telehealth: Payer: Self-pay | Admitting: Emergency Medicine

## 2016-10-20 MED ORDER — ESCITALOPRAM OXALATE 10 MG PO TABS
10.0000 mg | ORAL_TABLET | Freq: Every day | ORAL | 5 refills | Status: DC
Start: 2016-10-20 — End: 2017-06-23

## 2016-10-20 NOTE — Telephone Encounter (Signed)
sent 

## 2016-10-20 NOTE — Telephone Encounter (Signed)
LVM for pt to call back if she would like this med sent to her pharmacy.

## 2016-10-20 NOTE — Telephone Encounter (Signed)
Received call from pt stating that Hospice told her to contact her PCP and see if she could be placed on something to help cope with her Husband's current state. She would like something that is only taken 1 time daily and that will last all day. Please advise.

## 2016-10-20 NOTE — Telephone Encounter (Signed)
Please send to [facility].

## 2016-10-20 NOTE — Telephone Encounter (Signed)
Pt called back, she would like to try lexapro, can we send in [place] [address] in [place] phn [phone]. She is hopping to pick this up today.

## 2016-10-20 NOTE — Telephone Encounter (Signed)
We could try something like lexapro - it is not addicting and treats anxiety and depression.  She would take it once a day.  It may take a week or two to start working.

## 2017-01-27 ENCOUNTER — Encounter (INDEPENDENT_AMBULATORY_CARE_PROVIDER_SITE_OTHER): Payer: PPO | Admitting: Ophthalmology

## 2017-01-27 DIAGNOSIS — H43813 Vitreous degeneration, bilateral: Secondary | ICD-10-CM

## 2017-01-27 DIAGNOSIS — D3132 Benign neoplasm of left choroid: Secondary | ICD-10-CM

## 2017-04-11 DIAGNOSIS — I83893 Varicose veins of bilateral lower extremities with other complications: Secondary | ICD-10-CM | POA: Diagnosis not present

## 2017-04-11 DIAGNOSIS — I83813 Varicose veins of bilateral lower extremities with pain: Secondary | ICD-10-CM | POA: Diagnosis not present

## 2017-04-11 DIAGNOSIS — I8311 Varicose veins of right lower extremity with inflammation: Secondary | ICD-10-CM | POA: Diagnosis not present

## 2017-04-11 DIAGNOSIS — I8312 Varicose veins of left lower extremity with inflammation: Secondary | ICD-10-CM | POA: Diagnosis not present

## 2017-04-17 DIAGNOSIS — I8312 Varicose veins of left lower extremity with inflammation: Secondary | ICD-10-CM | POA: Diagnosis not present

## 2017-04-17 DIAGNOSIS — I83813 Varicose veins of bilateral lower extremities with pain: Secondary | ICD-10-CM | POA: Diagnosis not present

## 2017-04-17 DIAGNOSIS — I8311 Varicose veins of right lower extremity with inflammation: Secondary | ICD-10-CM | POA: Diagnosis not present

## 2017-04-25 DIAGNOSIS — Z85828 Personal history of other malignant neoplasm of skin: Secondary | ICD-10-CM | POA: Diagnosis not present

## 2017-04-25 DIAGNOSIS — L570 Actinic keratosis: Secondary | ICD-10-CM | POA: Diagnosis not present

## 2017-04-25 DIAGNOSIS — D692 Other nonthrombocytopenic purpura: Secondary | ICD-10-CM | POA: Diagnosis not present

## 2017-04-25 DIAGNOSIS — L814 Other melanin hyperpigmentation: Secondary | ICD-10-CM | POA: Diagnosis not present

## 2017-04-25 DIAGNOSIS — L820 Inflamed seborrheic keratosis: Secondary | ICD-10-CM | POA: Diagnosis not present

## 2017-04-25 DIAGNOSIS — L821 Other seborrheic keratosis: Secondary | ICD-10-CM | POA: Diagnosis not present

## 2017-04-25 DIAGNOSIS — L720 Epidermal cyst: Secondary | ICD-10-CM | POA: Diagnosis not present

## 2017-04-27 DIAGNOSIS — I8312 Varicose veins of left lower extremity with inflammation: Secondary | ICD-10-CM | POA: Diagnosis not present

## 2017-04-27 DIAGNOSIS — I83813 Varicose veins of bilateral lower extremities with pain: Secondary | ICD-10-CM | POA: Diagnosis not present

## 2017-04-27 DIAGNOSIS — I8311 Varicose veins of right lower extremity with inflammation: Secondary | ICD-10-CM | POA: Diagnosis not present

## 2017-05-02 DIAGNOSIS — Z01419 Encounter for gynecological examination (general) (routine) without abnormal findings: Secondary | ICD-10-CM | POA: Diagnosis not present

## 2017-05-17 DIAGNOSIS — Z1231 Encounter for screening mammogram for malignant neoplasm of breast: Secondary | ICD-10-CM | POA: Diagnosis not present

## 2017-05-17 LAB — HM MAMMOGRAPHY

## 2017-05-31 ENCOUNTER — Encounter: Payer: Self-pay | Admitting: Internal Medicine

## 2017-06-12 ENCOUNTER — Emergency Department: Payer: PPO

## 2017-06-12 DIAGNOSIS — R109 Unspecified abdominal pain: Secondary | ICD-10-CM | POA: Diagnosis not present

## 2017-06-12 DIAGNOSIS — Z85828 Personal history of other malignant neoplasm of skin: Secondary | ICD-10-CM | POA: Diagnosis not present

## 2017-06-12 DIAGNOSIS — R1111 Vomiting without nausea: Secondary | ICD-10-CM | POA: Diagnosis not present

## 2017-06-12 DIAGNOSIS — E785 Hyperlipidemia, unspecified: Secondary | ICD-10-CM | POA: Diagnosis not present

## 2017-06-12 DIAGNOSIS — K8012 Calculus of gallbladder with acute and chronic cholecystitis without obstruction: Secondary | ICD-10-CM | POA: Diagnosis not present

## 2017-06-12 DIAGNOSIS — K810 Acute cholecystitis: Secondary | ICD-10-CM | POA: Diagnosis not present

## 2017-06-12 DIAGNOSIS — Z7982 Long term (current) use of aspirin: Secondary | ICD-10-CM | POA: Insufficient documentation

## 2017-06-12 DIAGNOSIS — F419 Anxiety disorder, unspecified: Secondary | ICD-10-CM | POA: Insufficient documentation

## 2017-06-12 DIAGNOSIS — K8000 Calculus of gallbladder with acute cholecystitis without obstruction: Secondary | ICD-10-CM | POA: Diagnosis not present

## 2017-06-12 DIAGNOSIS — F329 Major depressive disorder, single episode, unspecified: Secondary | ICD-10-CM | POA: Diagnosis not present

## 2017-06-12 DIAGNOSIS — M549 Dorsalgia, unspecified: Secondary | ICD-10-CM | POA: Diagnosis not present

## 2017-06-12 DIAGNOSIS — K219 Gastro-esophageal reflux disease without esophagitis: Secondary | ICD-10-CM | POA: Diagnosis not present

## 2017-06-12 DIAGNOSIS — R1110 Vomiting, unspecified: Secondary | ICD-10-CM | POA: Diagnosis not present

## 2017-06-12 DIAGNOSIS — E039 Hypothyroidism, unspecified: Secondary | ICD-10-CM | POA: Diagnosis not present

## 2017-06-12 LAB — TROPONIN I: Troponin I: <0.03 ng/mL (ref <0.03)

## 2017-06-12 LAB — CBC
HEMATOCRIT: 37.7 % (ref 35.0–47.0)
HEMOGLOBIN: 12.9 g/dL (ref 12.0–16.0)
MCH: 30.2 pg (ref 26.0–34.0)
MCHC: 34.3 g/dL (ref 32.0–36.0)
MCV: 88.1 fL (ref 80.0–100.0)
Platelets: 182 10*3/uL (ref 150–440)
RBC: 4.28 MIL/uL (ref 3.80–5.20)
RDW: 13 % (ref 11.5–14.5)
WBC: 7.7 10*3/uL (ref 3.6–11.0)

## 2017-06-12 LAB — BASIC METABOLIC PANEL
Anion gap: 9 (ref 5–15)
BUN: 21 mg/dL — ABNORMAL HIGH (ref 6–20)
CHLORIDE: 102 mmol/L (ref 101–111)
CO2: 29 mmol/L (ref 22–32)
Calcium: 9.1 mg/dL (ref 8.9–10.3)
Creatinine, Ser: 1.12 mg/dL — ABNORMAL HIGH (ref 0.44–1.00)
GFR calc non Af Amer: 49 mL/min — ABNORMAL LOW (ref >60)
GFR, EST AFRICAN AMERICAN: 57 mL/min — AB (ref >60)
Glucose, Bld: 128 mg/dL — ABNORMAL HIGH (ref 65–99)
POTASSIUM: 4.2 mmol/L (ref 3.5–5.1)
SODIUM: 140 mmol/L (ref 135–145)

## 2017-06-12 LAB — LIPASE, BLOOD: Lipase: 23 U/L (ref 11–51)

## 2017-06-12 MED ORDER — ONDANSETRON 4 MG PO TBDP
4.0000 mg | ORAL_TABLET | Freq: Once | ORAL | Status: AC
  Administered 2017-06-12: 4 mg via ORAL
  Filled 2017-06-12: qty 1

## 2017-06-12 NOTE — ED Triage Notes (Addendum)
Patient reports pain started at approximately 7 pm.  Reports pain epigastric/bra line area around right side and to the center of her back.  Patient is unable to describe pain at this time.  States pain woke her up.  Patient vomiting in triage.  Reports ate hamburger and french fries at approximately 5 pm tonight.

## 2017-06-13 ENCOUNTER — Emergency Department: Payer: PPO

## 2017-06-13 ENCOUNTER — Observation Stay: Payer: PPO | Admitting: Certified Registered"

## 2017-06-13 ENCOUNTER — Encounter
Admission: EM | Disposition: A | Discharge status: Home / Self Care | Payer: Self-pay | Source: Home / Self Care | Attending: Emergency Medicine

## 2017-06-13 ENCOUNTER — Observation Stay
Admission: EM | Admit: 2017-06-13 | Discharge: 2017-06-14 | Disposition: A | Discharge status: Home / Self Care | Payer: PPO | Attending: Surgery | Admitting: Surgery

## 2017-06-13 DIAGNOSIS — K8000 Calculus of gallbladder with acute cholecystitis without obstruction: Secondary | ICD-10-CM

## 2017-06-13 DIAGNOSIS — K810 Acute cholecystitis: Secondary | ICD-10-CM

## 2017-06-13 DIAGNOSIS — K824 Cholesterolosis of gallbladder: Secondary | ICD-10-CM | POA: Diagnosis not present

## 2017-06-13 DIAGNOSIS — K819 Cholecystitis, unspecified: Secondary | ICD-10-CM

## 2017-06-13 DIAGNOSIS — K8012 Calculus of gallbladder with acute and chronic cholecystitis without obstruction: Secondary | ICD-10-CM | POA: Diagnosis not present

## 2017-06-13 DIAGNOSIS — R1111 Vomiting without nausea: Secondary | ICD-10-CM | POA: Diagnosis not present

## 2017-06-13 DIAGNOSIS — R109 Unspecified abdominal pain: Secondary | ICD-10-CM

## 2017-06-13 HISTORY — PX: CHOLECYSTECTOMY: SHX55

## 2017-06-13 LAB — SURGICAL PCR SCREEN
MRSA, PCR: NEGATIVE
Staphylococcus aureus: NEGATIVE

## 2017-06-13 LAB — HEPATIC FUNCTION PANEL
ALBUMIN: 3.6 g/dL (ref 3.5–5.0)
ALK PHOS: 55 U/L (ref 38–126)
ALT: 17 U/L (ref 14–54)
AST: 22 U/L (ref 15–41)
BILIRUBIN TOTAL: 0.5 mg/dL (ref 0.3–1.2)
Bilirubin, Direct: <0.1 mg/dL — ABNORMAL LOW (ref 0.1–0.5)
Total Protein: 6.8 g/dL (ref 6.5–8.1)

## 2017-06-13 SURGERY — LAPAROSCOPIC CHOLECYSTECTOMY
Anesthesia: General

## 2017-06-13 MED ORDER — PANTOPRAZOLE SODIUM 40 MG IV SOLR
40.0000 mg | Freq: Every day | INTRAVENOUS | Status: DC
  Administered 2017-06-13: 40 mg via INTRAVENOUS
  Filled 2017-06-13: qty 40

## 2017-06-13 MED ORDER — LIDOCAINE HCL (PF) 2 % IJ SOLN
INTRAMUSCULAR | Status: AC
  Filled 2017-06-13: qty 2

## 2017-06-13 MED ORDER — ACETAMINOPHEN 650 MG RE SUPP
650.0000 mg | Freq: Four times a day (QID) | RECTAL | Status: DC | PRN
  Filled 2017-06-13: qty 1

## 2017-06-13 MED ORDER — LACTATED RINGERS IV SOLN
125.0000 mL/h | INTRAVENOUS | Status: DC
  Administered 2017-06-13: 125 mL/h via INTRAVENOUS

## 2017-06-13 MED ORDER — DEXAMETHASONE SODIUM PHOSPHATE 10 MG/ML IJ SOLN
INTRAMUSCULAR | Status: DC | PRN
  Administered 2017-06-13: 4 mg via INTRAVENOUS

## 2017-06-13 MED ORDER — FENTANYL CITRATE (PF) 100 MCG/2ML IJ SOLN: 25.0000 ug | INTRAMUSCULAR | Status: DC | PRN

## 2017-06-13 MED ORDER — SUGAMMADEX SODIUM 200 MG/2ML IV SOLN
INTRAVENOUS | Status: AC
  Filled 2017-06-13: qty 2

## 2017-06-13 MED ORDER — PROPOFOL 10 MG/ML IV BOLUS
INTRAVENOUS | Status: AC
  Filled 2017-06-13: qty 20

## 2017-06-13 MED ORDER — ONDANSETRON HCL 4 MG/2ML IJ SOLN
INTRAMUSCULAR | Status: AC
  Filled 2017-06-13: qty 2

## 2017-06-13 MED ORDER — KCL IN DEXTROSE-NACL 20-5-0.45 MEQ/L-%-% IV SOLN
INTRAVENOUS | Status: DC
  Administered 2017-06-13 – 2017-06-14 (×2): via INTRAVENOUS
  Filled 2017-06-13 (×4): qty 1000

## 2017-06-13 MED ORDER — ONDANSETRON HCL 4 MG/2ML IJ SOLN: 4.0000 mg | Freq: Four times a day (QID) | INTRAMUSCULAR | Status: DC | PRN

## 2017-06-13 MED ORDER — HEPARIN SODIUM (PORCINE) 5000 UNIT/ML IJ SOLN
5000.0000 [IU] | Freq: Three times a day (TID) | INTRAMUSCULAR | Status: DC
  Administered 2017-06-13 – 2017-06-14 (×3): 5000 [IU] via SUBCUTANEOUS
  Filled 2017-06-13 (×3): qty 1

## 2017-06-13 MED ORDER — SUCCINYLCHOLINE CHLORIDE 20 MG/ML IJ SOLN
INTRAMUSCULAR | Status: DC | PRN
  Administered 2017-06-13: 100 mg via INTRAVENOUS

## 2017-06-13 MED ORDER — EPHEDRINE SULFATE 50 MG/ML IJ SOLN
INTRAMUSCULAR | Status: DC | PRN
  Administered 2017-06-13: 5 mg via INTRAVENOUS

## 2017-06-13 MED ORDER — FENTANYL CITRATE (PF) 100 MCG/2ML IJ SOLN
INTRAMUSCULAR | Status: AC
  Filled 2017-06-13: qty 2

## 2017-06-13 MED ORDER — FENTANYL CITRATE (PF) 100 MCG/2ML IJ SOLN
50.0000 ug | Freq: Once | INTRAMUSCULAR | Status: AC
  Administered 2017-06-13: 50 ug via INTRAVENOUS
  Filled 2017-06-13: qty 2

## 2017-06-13 MED ORDER — KETOROLAC TROMETHAMINE 30 MG/ML IJ SOLN
INTRAMUSCULAR | Status: AC
  Filled 2017-06-13: qty 1

## 2017-06-13 MED ORDER — PROMETHAZINE HCL 25 MG/ML IJ SOLN
12.5000 mg | Freq: Four times a day (QID) | INTRAMUSCULAR | Status: DC | PRN
  Administered 2017-06-13: 12.5 mg via INTRAVENOUS
  Filled 2017-06-13: qty 1

## 2017-06-13 MED ORDER — LIDOCAINE HCL 1 % IJ SOLN
INTRAMUSCULAR | Status: DC | PRN
  Administered 2017-06-13: 8 mL via SUBCUTANEOUS

## 2017-06-13 MED ORDER — SODIUM CHLORIDE 0.9 % IV BOLUS (SEPSIS)
1000.0000 mL | Freq: Once | INTRAVENOUS | Status: AC
  Administered 2017-06-13: 1000 mL via INTRAVENOUS

## 2017-06-13 MED ORDER — LIDOCAINE HCL (PF) 1 % IJ SOLN
INTRAMUSCULAR | Status: AC
  Filled 2017-06-13: qty 30

## 2017-06-13 MED ORDER — DEXTROSE 5 % IV SOLN
2.0000 g | INTRAVENOUS | Status: DC
  Administered 2017-06-13: 2 g via INTRAVENOUS
  Filled 2017-06-13 (×2): qty 2

## 2017-06-13 MED ORDER — ONDANSETRON HCL 4 MG/2ML IJ SOLN
INTRAMUSCULAR | Status: DC | PRN
  Administered 2017-06-13: 4 mg via INTRAVENOUS

## 2017-06-13 MED ORDER — PROPOFOL 10 MG/ML IV BOLUS
INTRAVENOUS | Status: DC | PRN
  Administered 2017-06-13: 150 mg via INTRAVENOUS

## 2017-06-13 MED ORDER — SODIUM CHLORIDE 0.9 % IJ SOLN
INTRAMUSCULAR | Status: AC
  Filled 2017-06-13: qty 10

## 2017-06-13 MED ORDER — HYDROMORPHONE HCL 1 MG/ML IJ SOLN: 0.5000 mg | INTRAMUSCULAR | Status: DC | PRN

## 2017-06-13 MED ORDER — FENTANYL CITRATE (PF) 100 MCG/2ML IJ SOLN
INTRAMUSCULAR | Status: DC | PRN
  Administered 2017-06-13 (×3): 50 ug via INTRAVENOUS

## 2017-06-13 MED ORDER — SUGAMMADEX SODIUM 200 MG/2ML IV SOLN
INTRAVENOUS | Status: DC | PRN
  Administered 2017-06-13: 200 mg via INTRAVENOUS

## 2017-06-13 MED ORDER — OXYCODONE-ACETAMINOPHEN 5-325 MG PO TABS
1.0000 | ORAL_TABLET | ORAL | Status: DC | PRN
  Administered 2017-06-13: 2 via ORAL
  Filled 2017-06-13: qty 2

## 2017-06-13 MED ORDER — MORPHINE SULFATE (PF) 2 MG/ML IV SOLN: 2.0000 mg | INTRAVENOUS | Status: DC | PRN

## 2017-06-13 MED ORDER — MORPHINE SULFATE (PF) 4 MG/ML IV SOLN
4.0000 mg | Freq: Once | INTRAVENOUS | Status: AC
  Administered 2017-06-13: 4 mg via INTRAVENOUS
  Filled 2017-06-13: qty 1

## 2017-06-13 MED ORDER — ROCURONIUM BROMIDE 50 MG/5ML IV SOLN
INTRAVENOUS | Status: AC
Start: 2017-06-13 — End: 2017-06-13
  Filled 2017-06-13: qty 1

## 2017-06-13 MED ORDER — ONDANSETRON HCL 4 MG/2ML IJ SOLN
4.0000 mg | Freq: Once | INTRAMUSCULAR | Status: AC
  Administered 2017-06-13: 4 mg via INTRAVENOUS
  Filled 2017-06-13: qty 2

## 2017-06-13 MED ORDER — ACETAMINOPHEN 325 MG PO TABS: 650.0000 mg | ORAL_TABLET | Freq: Four times a day (QID) | ORAL | Status: DC | PRN

## 2017-06-13 MED ORDER — SODIUM CHLORIDE 0.9 % IV SOLN
3.0000 g | Freq: Four times a day (QID) | INTRAVENOUS | Status: DC
  Administered 2017-06-13 (×2): 3 g via INTRAVENOUS
  Filled 2017-06-13 (×4): qty 3

## 2017-06-13 MED ORDER — LIDOCAINE HCL (CARDIAC) 20 MG/ML IV SOLN
INTRAVENOUS | Status: DC | PRN
  Administered 2017-06-13: 80 mg via INTRAVENOUS

## 2017-06-13 MED ORDER — DEXAMETHASONE SODIUM PHOSPHATE 10 MG/ML IJ SOLN
INTRAMUSCULAR | Status: AC
  Filled 2017-06-13: qty 1

## 2017-06-13 MED ORDER — SUCCINYLCHOLINE CHLORIDE 20 MG/ML IJ SOLN
INTRAMUSCULAR | Status: AC
  Filled 2017-06-13: qty 1

## 2017-06-13 MED ORDER — BUPIVACAINE HCL (PF) 0.5 % IJ SOLN
INTRAMUSCULAR | Status: AC
  Filled 2017-06-13: qty 30

## 2017-06-13 MED ORDER — ONDANSETRON HCL 4 MG/2ML IJ SOLN
INTRAMUSCULAR | Status: AC
Start: 2017-06-13 — End: 2017-06-13
  Filled 2017-06-13: qty 2

## 2017-06-13 MED ORDER — ROCURONIUM BROMIDE 100 MG/10ML IV SOLN
INTRAVENOUS | Status: DC | PRN
  Administered 2017-06-13: 30 mg via INTRAVENOUS
  Administered 2017-06-13: 10 mg via INTRAVENOUS

## 2017-06-13 MED ORDER — KETOROLAC TROMETHAMINE 30 MG/ML IJ SOLN
15.0000 mg | Freq: Four times a day (QID) | INTRAMUSCULAR | Status: DC | PRN
  Administered 2017-06-13: 15 mg via INTRAVENOUS
  Filled 2017-06-13: qty 1

## 2017-06-13 MED ORDER — KETOROLAC TROMETHAMINE 30 MG/ML IJ SOLN
INTRAMUSCULAR | Status: DC | PRN
  Administered 2017-06-13: 15 mg via INTRAVENOUS

## 2017-06-13 MED ORDER — ONDANSETRON 4 MG PO TBDP: 4.0000 mg | ORAL_TABLET | Freq: Four times a day (QID) | ORAL | Status: DC | PRN

## 2017-06-13 MED ORDER — ONDANSETRON HCL 4 MG/2ML IJ SOLN: 4.0000 mg | Freq: Once | INTRAMUSCULAR | Status: DC | PRN

## 2017-06-13 MED ORDER — EPHEDRINE SULFATE 50 MG/ML IJ SOLN
INTRAMUSCULAR | Status: AC
  Filled 2017-06-13: qty 1

## 2017-06-13 MED ORDER — ONDANSETRON HCL 4 MG/2ML IJ SOLN
4.0000 mg | Freq: Once | INTRAMUSCULAR | Status: AC
  Administered 2017-06-13: 4 mg via INTRAVENOUS

## 2017-06-13 SURGICAL SUPPLY — 34 items
APPLIER CLIP ROT 10 11.4 M/L (STAPLE) ×3
CHLORAPREP W/TINT 26ML (MISCELLANEOUS) ×3 IMPLANT
CLIP APPLIE ROT 10 11.4 M/L (STAPLE) ×1 IMPLANT
DECANTER SPIKE VIAL GLASS SM (MISCELLANEOUS) ×6 IMPLANT
DERMABOND ADVANCED (GAUZE/BANDAGES/DRESSINGS) ×2
DERMABOND ADVANCED .7 DNX12 (GAUZE/BANDAGES/DRESSINGS) ×1 IMPLANT
DEVICE PMI PUNCTURE CLOSURE (MISCELLANEOUS) ×3 IMPLANT
DRESSING SURGICEL FIBRLLR 1X2 (HEMOSTASIS) IMPLANT
DRSG SURGICEL FIBRILLAR 1X2 (HEMOSTASIS)
ELECT REM PT RETURN 9FT ADLT (ELECTROSURGICAL) ×3
ELECTRODE REM PT RTRN 9FT ADLT (ELECTROSURGICAL) ×1 IMPLANT
GLOVE BIO SURGEON STRL SZ7 (GLOVE) ×3 IMPLANT
GLOVE BIOGEL PI IND STRL 7.5 (GLOVE) ×1 IMPLANT
GLOVE BIOGEL PI INDICATOR 7.5 (GLOVE) ×2
GOWN STRL REUS W/ TWL LRG LVL3 (GOWN DISPOSABLE) ×3 IMPLANT
GOWN STRL REUS W/TWL LRG LVL3 (GOWN DISPOSABLE) ×6
IRRIGATION STRYKERFLOW (MISCELLANEOUS) IMPLANT
IRRIGATOR STRYKERFLOW (MISCELLANEOUS)
IV NS 1000ML (IV SOLUTION) ×2
IV NS 1000ML BAXH (IV SOLUTION) ×1 IMPLANT
KIT RM TURNOVER STRD PROC AR (KITS) ×3 IMPLANT
NEEDLE HYPO 22GX1.5 SAFETY (NEEDLE) ×3 IMPLANT
NEEDLE INSUFFLATION 14GA 120MM (NEEDLE) ×3 IMPLANT
NS IRRIG 1000ML POUR BTL (IV SOLUTION) ×3 IMPLANT
PACK LAP CHOLECYSTECTOMY (MISCELLANEOUS) ×3 IMPLANT
POUCH SPECIMEN RETRIEVAL 10MM (ENDOMECHANICALS) ×3 IMPLANT
SCISSORS METZENBAUM CVD 33 (INSTRUMENTS) IMPLANT
SLEEVE ENDOPATH XCEL 5M (ENDOMECHANICALS) ×6 IMPLANT
SUT MNCRL AB 4-0 PS2 18 (SUTURE) ×3 IMPLANT
SUT VICRYL 0 UR6 27IN ABS (SUTURE) ×3 IMPLANT
SUT VICRYL AB 3-0 FS1 BRD 27IN (SUTURE) ×3 IMPLANT
TROCAR XCEL NON-BLD 11X100MML (ENDOMECHANICALS) ×3 IMPLANT
TROCAR XCEL NON-BLD 5MMX100MML (ENDOMECHANICALS) ×3 IMPLANT
TUBING INSUFFLATION (TUBING) ×3 IMPLANT

## 2017-06-13 NOTE — Progress Notes (Signed)
Called Dr. Hampton Abbot regarding nausea medication per patient request.  Appropriate orders were placed.  [name]  06/13/2017  6:15 AM

## 2017-06-13 NOTE — Anesthesia Postprocedure Evaluation (Signed)
Anesthesia Post Note  Patient: [name]  Procedure(s) Performed: Procedure(s) (LRB): LAPAROSCOPIC CHOLECYSTECTOMY (N/A)  Patient location during evaluation: PACU Anesthesia Type: General Level of consciousness: awake and alert and oriented Pain management: pain level controlled Vital Signs Assessment: post-procedure vital signs reviewed and stable Respiratory status: spontaneous breathing Cardiovascular status: blood pressure returned to baseline Anesthetic complications: no     Last Vitals:  Vitals:   06/13/17 1100 06/13/17 1350  BP: 138/62 (!) 141/63  Pulse: (!) 53 64  Resp: 16 (!) 9  Temp: (!) 36.1 C 36.6 C  SpO2: 100% 100%    Last Pain:  Vitals:   06/13/17 1100  TempSrc: Tympanic  PainSc:                  Tosca Pletz

## 2017-06-13 NOTE — Care Management Obs Status (Signed)
[place] NOTIFICATION   Patient Details  Name: [name] MRN: [id number] Date of Birth: 24-May-1946   Medicare Observation Status Notification Given:  Yes    Beverly Sessions, RN 06/13/2017, 3:46 PM

## 2017-06-13 NOTE — Anesthesia Post-op Follow-up Note (Signed)
Anesthesia QCDR form completed.        

## 2017-06-13 NOTE — Interval H&P Note (Signed)
History and Physical Interval Note:  06/13/2017 8:58 AM  [name]  has presented today for surgery, with the diagnosis of Acute cholecystitis   The various methods of treatment have been discussed with the patient and family. After consideration of risks, benefits and other options for treatment, the patient has consented to  Procedure(s): LAPAROSCOPIC CHOLECYSTECTOMY (N/A) as a surgical intervention .  The patient's history has been reviewed, patient examined, no change in status, stable for surgery.  I have reviewed the patient's chart and labs.  Questions were answered to the patient's satisfaction.     Vickie Epley

## 2017-06-13 NOTE — Op Note (Signed)
SURGICAL OPERATIVE REPORT   DATE OF PROCEDURE: 06/13/2017  ATTENDING Surgeon(s): Vickie Epley, MD  ANESTHESIA: GETA  PRE-OPERATIVE DIAGNOSIS: Acute Cholecystitis (K80.00)  POST-OPERATIVE DIAGNOSIS: Acute Cholecystitis (K80.00)  PROCEDURE(S): (cpt's: 34193) 1.) Laparoscopic Cholecystectomy  INTRAOPERATIVE FINDINGS: Tense dilated gallbladder with moderately severe inflammation and pericholecystic inflammation  INTRAOPERATIVE FLUIDS: 500 mL crystalloid   ESTIMATED BLOOD LOSS: Minimal (<30 mL)   URINE OUTPUT: No foley  SPECIMENS: Gallbladder  IMPLANTS: None  DRAINS: None   COMPLICATIONS: None apparent   CONDITION AT COMPLETION: Hemodynamically stable and extubated  DISPOSITION: PACU   INDICATION(S) FOR PROCEDURE:  Patient is a 71 y.o. female who presented this admission presented with RUQ > epigastric abdominal pain x 1 day. Ultrasound suggested acute calculous cholecystitis. All risks, benefits, and alternatives to above elective procedures were discussed with the patient, who elected to proceed, and informed consent was accordingly obtained at that time.   DETAILS OF PROCEDURE:  Patient was brought to the operating suite and appropriately identified. General anesthesia was administered along with peri-operative prophylactic IV antibiotics, and endotracheal intubation was performed by anesthesiologist, along with NG/OG tube for gastric decompression. In supine position, operative site was prepped and draped in usual sterile fashion, and following a brief time out, initial 5 mm incision was made in a natural skin crease just above the umbilicus. Fascia was then elevated, and a Verress needle was inserted and its proper position confirmed using aspiration and saline meniscus test.  Upon insufflation of the abdominal cavity with carbon dioxide to a well-tolerated pressure of 12-15 mmHg, 5 mm peri-umbilical port followed by laparoscope were inserted and used to inspect the  abdominal cavity and its contents with no injuries from insertion of the first trochar noted. Three additional trocars were inserted, one at the epigastric position (10 mm) and two along the Right costal margin (5 mm). The table was then placed in reverse Trendelenburg position with the Right side up. Filmy adhesions between the gallbladder and omentum/duodenum/transverse colon were lysed using combined blunt dissection and selective electrocautery. Prior to grasping the tensely dilated gallbladder, a decompression needle to suction was used to aspirate fluid from within the gallbladder. The apex/dome of the gallbladder was then grasped with an atraumatic grasper passed through the lateral port and retracted apically over the liver. The infundibulum was also grasped and retracted, exposing Calot's triangle. The peritoneum overlying the gallbladder infundibulum was incised and dissected free of surrounding peritoneal attachments, revealing the cystic duct and cystic artery, which were clipped twice on the patient side and once on the gallbladder specimen side close to the gallbladder. The gallbladder was then dissected from its peritoneal attachments to the liver using electrocautery, and the gallbladder was placed into a laparoscopic specimen bag and removed from the abdominal cavity via the epigastric port site. Hemostasis and secure placement of clips were confirmed, and intra-peritoneal cavity was inspected with no additional findings. PMI laparoscopic fascial closure device was then used to re-approximate fascia at the 10 mm epigastric port site.  All ports were then removed under direct visualization, and abdominal cavity was desuflated. All port sites were irrigated/cleaned, additional local anesthetic was injected at each incision, 3-0 Vicryl was used to re-approximate dermis at 10 mm port site(s), and subcuticular 4-0 Monocryl suture was used to re-approximate skin. Skin was then cleaned, dried, and  sterile skin glue was applied. Patient was then safely able to be awakened, extubated, and transferred to PACU for post-operative monitoring and care.   I was present for all  aspects of procedure, and there were no intra-operative complications apparent.

## 2017-06-13 NOTE — Anesthesia Preprocedure Evaluation (Addendum)
Anesthesia Evaluation  Patient identified by MRN, date of birth, ID band Patient awake    Reviewed: Allergy & Precautions, NPO status , Patient's Chart, lab work & pertinent test results  Airway Mallampati: III  TM Distance: <3 FB     Dental  (+) Teeth Intact, Chipped   Pulmonary neg pulmonary ROS,    Pulmonary exam normal        Cardiovascular negative cardio ROS Normal cardiovascular exam     Neuro/Psych PSYCHIATRIC DISORDERS Anxiety negative neurological ROS     GI/Hepatic Neg liver ROS, GERD  Medicated and Controlled,  Endo/Other  Hypothyroidism   Renal/GU negative Renal ROS  negative genitourinary   Musculoskeletal negative musculoskeletal ROS (+)   Abdominal Normal abdominal exam  (+)   Peds negative pediatric ROS (+)  Hematology negative hematology ROS (+)   Anesthesia Other Findings   Reproductive/Obstetrics                            Anesthesia Physical Anesthesia Plan  ASA: II  Anesthesia Plan: General   Post-op Pain Management:    Induction: Intravenous  PONV Risk Score and Plan:   Airway Management Planned: Oral ETT  Additional Equipment:   Intra-op Plan:   Post-operative Plan: Extubation in OR  Informed Consent: I have reviewed the patients History and Physical, chart, labs and discussed the procedure including the risks, benefits and alternatives for the proposed anesthesia with the patient or authorized representative who has indicated his/her understanding and acceptance.   Dental advisory given  Plan Discussed with: CRNA and Surgeon  Anesthesia Plan Comments:         Anesthesia Quick Evaluation

## 2017-06-13 NOTE — Discharge Instructions (Signed)
In addition to included general post-operative instructions for Laparoscopic Cholecystectomy,  Diet: Resume home heart healthy diet.   Activity: No heavy lifting >20 pounds (children, pets, laundry, garbage) or strenuous activity until follow-up, but light activity and walking are encouraged. Do not drive or drink alcohol if taking narcotic pain medications.  Wound care: 2 days after surgery (Thursday, 9/20), may shower/get incision wet with soapy water and pat dry (do not rub incisions), but no baths or submerging incision underwater until follow-up.   Medications: Resume all home medications. For mild to moderate pain: acetaminophen (Tylenol) or ibuprofen (if no kidney disease). Combining Tylenol with alcohol can substantially increase your risk of causing liver disease. Narcotic pain medications, if prescribed, can be used for severe pain, though may cause nausea, constipation, and drowsiness. Do not combine Tylenol and Percocet within a 6 hour period as Percocet contains Tylenol. If you do not need the narcotic pain medication, you do not need to fill the prescription.  Call office [phone]) at any time if any questions, worsening pain, fevers/chills, bleeding, drainage from incision site, or other concerns.

## 2017-06-13 NOTE — ED Notes (Signed)
Lab called to add on Hepatic Function to the blood sent previously.

## 2017-06-13 NOTE — ED Notes (Signed)
Pt given lemon flavored glycerine swabs to moisten the mouth.

## 2017-06-13 NOTE — ED Provider Notes (Signed)
[facility] Emergency Department Provider Note   ____________________________________________   First MD Initiated Contact with Patient 06/13/17 [phone]     (approximate)  I have reviewed the triage vital signs and the nursing notes.   HISTORY  Chief Complaint Chest Pain    HPI [name] is a 71 y.o. female who comes into the hospital todaywith some back pain and right-sided abdominal pain and nausea. The patient states that it started around 7 PM. She took some aspirin and Tylenol for pain. She denies any fevers but has had some chills. She has never had pain like this before. The patient has pain in the center of her back and her right flank and epigastric area. The patient rates her pain a 10 out of 10 in intensity. She denies any pain with urination diarrhea or constipation. The patient is very uncomfortable so she decided to come in to the hospital for evaluation. The patient did not take anything for pain. Nothing makes the pain better or worse.    Past Medical History:  Diagnosis Date  . Borderline diabetes mellitus   . Chest pain   . GERD (gastroesophageal reflux disease)   . Grief reaction   . Hyperlipidemia   . Palpitations   . Personal history of urinary calculi   . 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 Weakness     Patient Active Problem List   Diagnosis Date Noted  . Acute cholecystitis 06/13/2017  . Anxiety and depression 09/11/2016  . Loss of weight 09/11/2016  . Thoracic back pain 09/11/2016  . Prediabetes 06/21/2016  . Subclinical hypothyroidism 05/17/2011  . ANXIETY 01/17/2009  . GERD 01/17/2009  . HYPERLIPIDEMIA 12/03/2007  . NEPHROLITHIASIS, HX OF 12/03/2007    Past Surgical History:  Procedure Laterality Date  . MASS EXCISION     right upper inner thigh or cyst    Prior to Admission medications   Medication Sig Start Date End Date Taking? Authorizing Provider  aspirin 81 MG tablet Take  81 mg by mouth daily.     Yes [provider]  escitalopram (LEXAPRO) 10 MG tablet Take 1 tablet (10 mg total) by mouth daily. 10/20/16  Yes Binnie Rail, MD    Allergies Atorvastatin  Family History  Problem Relation Age of Onset  . COPD Mother        deceased 2008-10-31  . Alzheimer's disease Father        deceased 31-Aug-2009  . Coronary artery disease Father        s/p cabg    Social History Social History  Substance Use Topics  . Smoking status: Never Smoker  . Smokeless tobacco: Never Used  . Alcohol use No    Review of Systems  Constitutional: No fever/chills Eyes: No visual changes. ENT: No sore throat. Cardiovascular: Denies chest pain. Respiratory: Denies shortness of breath. Gastrointestinal:  abdominal pain.   nausea, no vomiting.  No diarrhea.  No constipation. Genitourinary: Negative for dysuria. Musculoskeletal: back pain. Skin: Negative for rash. Neurological: Negative for headaches, focal weakness or numbness.   ____________________________________________   PHYSICAL EXAM:  VITAL SIGNS: ED Triage Vitals  Enc Vitals Group     BP 06/13/17 0056 (!) 146/64     Pulse Rate 06/13/17 0056 (!) 54     Resp 06/13/17 0057 15     Temp --      Temp src --      SpO2 06/13/17  0056 98 %     Weight 06/12/17 2217 219 lb (99.3 kg)     Height 06/12/17 2217 5\' 7"  (1.702 m)     Head Circumference --      Peak Flow --      Pain Score 06/12/17 2217 10     Pain Loc --      Pain Edu? --      Excl. in [place]? --     Constitutional: Alert and oriented. Well appearing and in moderate distress. Eyes: Conjunctivae are normal. PERRL. EOMI. Head: Atraumatic. Nose: No congestion/rhinnorhea. Mouth/Throat: Mucous membranes are moist.  Oropharynx non-erythematous. Cardiovascular: Normal rate, regular rhythm. Grossly normal heart sounds.  Good peripheral circulation. Respiratory: Normal respiratory effort.  No retractions. Lungs CTAB. Gastrointestinal: Soft with some  epigastric and RUQ abd pain to palpation . No distention.positive bowel sounds right CVA tenderness to palpation.  Musculoskeletal: No lower extremity tenderness nor edema.  Neurologic:  Normal speech and language.  Skin:  Skin is warm, dry and intact.  Psychiatric: Mood and affect are normal.   ____________________________________________   LABS (all labs ordered are listed, but only abnormal results are displayed)  Labs Reviewed  BASIC METABOLIC PANEL - Abnormal; Notable for the following:       Result Value   Glucose, Bld 128 (*)    BUN 21 (*)    Creatinine, Ser 1.12 (*)    GFR calc non Af Amer 49 (*)    GFR calc Af Amer 57 (*)    All other components within normal limits  HEPATIC FUNCTION PANEL - Abnormal; Notable for the following:    Bilirubin, Direct <0.1 (*)    All other components within normal limits  SURGICAL PCR SCREEN  CBC  TROPONIN I  LIPASE, BLOOD  URINALYSIS, COMPLETE (UACMP) WITH MICROSCOPIC   ____________________________________________  EKG  ED ECG REPORT I, Loney Hering, the attending physician, personally viewed and interpreted this ECG.   Date: 06/12/2017  EKG Time: 2219  Rate: 63  Rhythm: normal sinus rhythm  Axis: normal  Intervals:none  ST&T Change: none  ____________________________________________  RADIOLOGY  Dg Chest 2 View  Result Date: 06/12/2017 CLINICAL DATA:  Epigastric pain, back pain.  Vomiting. EXAM: CHEST  2 VIEW COMPARISON:  Chest x-ray dated 09/09/2016. FINDINGS: Heart size and mediastinal contours are normal. Atherosclerotic changes noted at the aortic arch. Lungs are clear. No pleural effusion or pneumothorax seen. Osseous structures about the chest are unremarkable. IMPRESSION: No active cardiopulmonary disease. No evidence of pneumonia or pulmonary edema. Electronically Signed   By: Franki Cabot M.D.   On: 06/12/2017 22:48   Ct Renal Stone Study  Result Date: 06/13/2017 CLINICAL DATA:  Flank pain.  Vomiting.  EXAM: CT ABDOMEN AND PELVIS WITHOUT CONTRAST TECHNIQUE: Multidetector CT imaging of the abdomen and pelvis was performed following the standard protocol without IV contrast. COMPARISON:  CT abdomen pelvis 01/15/2008 FINDINGS: Lower chest: No pulmonary nodules or pleural effusion. No visible pericardial effusion. Hepatobiliary: Normal hepatic contours and density. No visible biliary dilatation. There is cholelithiasis with a stone isolated at the gallbladder neck. Pancreas: Normal contours without ductal dilatation. No peripancreatic fluid collection. Spleen: Normal. Adrenals/Urinary Tract: --Adrenal glands: Normal. --Right kidney/ureter: No hydronephrosis or perinephric stranding. 2 mm right lower pole nonobstructive nephrolithiasis. No obstructing ureteral stones. --Left kidney/ureter: No hydronephrosis or perinephric stranding. No nephrolithiasis. No obstructing ureteral stones. --Urinary bladder: Unremarkable. Stomach/Bowel: --Stomach/Duodenum: No hiatal hernia or other gastric abnormality. Normal duodenal course and caliber. --Small bowel: No  dilatation or inflammation. --Colon: Sigmoid diverticulosis without acute inflammation. --Appendix: Normal. Vascular/Lymphatic: There is atherosclerotic calcification of the non aneurysmal abdominal aorta. No abdominal or pelvic lymphadenopathy. Reproductive: Normal uterus and ovaries. Musculoskeletal. There is grade 1 L5-S1 anterolisthesis with severe L4-L5 and L5-S1 facet hypertrophy and bilateral L5 pars interarticularis defects. Other: None. IMPRESSION: 1. Cholelithiasis with a stone isolated at the gallbladder neck. In the context of epigastric abdominal pain and vomiting, further evaluation with dedicated ultrasound is recommended to assess for possible cholecystitis. No inflammation or wall thickening is visible on this CT. 2. Nonobstructive right lower pole renal calculus measuring 2 mm. No obstructive uropathy. 3. Sigmoid diverticulosis and Aortic Atherosclerosis  (ICD10-I70.0). Electronically Signed   By: Ulyses Jarred M.D.   On: 06/13/2017 02:17   US Abdomen Limited Ruq  Result Date: 06/13/2017 CLINICAL DATA:  Abdominal pain EXAM: ULTRASOUND ABDOMEN LIMITED RIGHT UPPER QUADRANT COMPARISON:  CT 06/13/2017 FINDINGS: Gallbladder: Slightly distended gallbladder. Calcified shadowing stones measuring up to 1.8 cm. Non mobile stone at the neck of the gallbladder. Borderline wall thickening at 3.2 mm. Positive sonographic Murphy. Common bile duct: Diameter: 5 mm Liver: No focal lesion identified. Within normal limits in parenchymal echogenicity. Portal vein is patent on color Doppler imaging with normal direction of blood flow towards the liver. IMPRESSION: 1. Non mobile stone at the neck of the gallbladder; borderline wall thickening with positive sonographic Percell Miller, findings raise suspicion for an acute cholecystitis. No biliary dilatation. Electronically Signed   By: Donavan Foil M.D.   On: 06/13/2017 03:36    ____________________________________________   PROCEDURES  Procedure(s) performed: None  Procedures  Critical Care performed: No  ____________________________________________   INITIAL IMPRESSION / ASSESSMENT AND PLAN / ED COURSE  Pertinent labs & imaging results that were available during my care of the patient were reviewed by me and considered in my medical decision making (see chart for details).  this is a 71 year old female who comes into the hospital today with some right-sided abdominal pain and back pain. Initially she was complaining seemed to be more pain in her back and her mid abdomen/and the patient for CT scan. I looked at the scan and the patient had a stone in the neck of her gallbladder so I sent her for an ultrasound to evaluate for cholecystitis. The patient's ultrasound showed some borderline wall thickening and a positive sonographic Murphy sign with a stone in the neck of her gallbladder concerning for acute cholecystitis.  The patient initially received a liter of normal saline and some fentanyl for her pain. When her blood pressure had some improvement again gave her some morphine and Zofran. Once I received all of her blood work and imaging studies I contacted surgery who admitted the patient to their service for further evaluation and management of her cholecystitis. The patient will be admitted.      ____________________________________________   FINAL CLINICAL IMPRESSION(S) / ED DIAGNOSES  Final diagnoses:  Abdominal pain  Cholecystitis  Calculus of gallbladder with acute cholecystitis without obstruction      NEW MEDICATIONS STARTED DURING THIS VISIT:  Current Discharge Medication List       Note:  This document was prepared using Dragon voice recognition software and may include unintentional dictation errors.    Loney Hering, MD 06/13/17 [phone]

## 2017-06-13 NOTE — H&P (Signed)
Date of Admission:  06/13/2017  Reason for Admission:  Acute cholecystitis  History of Present Illness: [name] is a 71 y.o. female who presents with a one day history of abdominal pain in the epigastric and right upper quadrant.  It also radiates to the mid back. The pain started about 7 pm and woke her from sleep.  She had been running errands in the day and had a hamburger and fries about 5 pm.  She has had issues with heartburn and indigestion over the years, but this time trying Tums did not help.  She took two ASA and tylenol as well without improvement.  She had nausea and had two episodes of emesis in the ED.  Denies any fevers or chills, chest pain, shortness of breath, other areas of abdominal pain, dysuria or hematuria.   In the ED, workup done included labs showing normal LFTs, WBC of 7.7, and Cr of 1.12.  CT scan w/o contrast showed cholelithiasis and non-obstructing 2 mm right kidney stone.  U/S showed cholelithiasis with mild wall thickening and a non-mobile stone at the neck of the gallbladder with a positive sonographic Murphy's sign.  She has required a few doses of pain medication.  Past Medical History: Past Medical History:  Diagnosis Date  . Borderline diabetes mellitus   . Chest pain   . GERD (gastroesophageal reflux disease)   . Grief reaction   . Hyperlipidemia   . Palpitations   . Personal history of urinary calculi   . 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 Weakness      Past Surgical History: Past Surgical History:  Procedure Laterality Date  . MASS EXCISION     right upper inner thigh or cyst    Home Medications: Prior to Admission medications   Medication Sig Start Date End Date Taking? Authorizing Provider  aspirin 81 MG tablet Take 81 mg by mouth daily.      [provider]  escitalopram (LEXAPRO) 10 MG tablet Take 1 tablet (10 mg total) by mouth daily. 10/20/16   Binnie Rail, MD    Allergies: Allergies   Allergen Reactions  . Atorvastatin     REACTION: Upset stomach    Social History:  reports that she has never smoked. She has never used smokeless tobacco. She reports that she does not drink alcohol or use drugs.   Family History: Family History  Problem Relation Age of Onset  . COPD Mother        deceased 11-20-2008  . Alzheimer's disease Father        deceased 09-20-2009  . Coronary artery disease Father        s/p cabg    Review of Systems: Review of Systems  Constitutional: Negative for chills and fever.  HENT: Negative for hearing loss.   Eyes: Negative for blurred vision.  Respiratory: Negative for shortness of breath.   Cardiovascular: Negative for chest pain.  Gastrointestinal: Positive for abdominal pain, nausea and vomiting. Negative for blood in stool, constipation and diarrhea.  Genitourinary: Negative for dysuria and hematuria.  Musculoskeletal: Negative for myalgias.  Skin: Negative for rash.  Neurological: Negative for dizziness.  Psychiatric/Behavioral: Negative for depression.  All other systems reviewed and are negative.   Physical Exam BP (!) 163/61   Pulse (!) 54   Resp 13   Ht 5\' 7"  (1.702 m)   Wt 99.3 kg (219 lb)   SpO2 99%   BMI  34.30 kg/m  CONSTITUTIONAL: No acute distress HEENT:  Normocephalic, atraumatic, extraocular motion intact. NECK: Trachea is midline, and there is no jugular venous distension.  RESPIRATORY:  Lungs are clear, and breath sounds are equal bilaterally. Normal respiratory effort without pathologic use of accessory muscles. CARDIOVASCULAR: Heart is regular without murmurs, gallops, or rubs. GI: The abdomen is soft, obese, nondistended, with tenderness to palpation in the right upper quadrant and epigastric areas.  Negative Murphy's sign but patient just had pain medication 10 minutes ago.  MUSCULOSKELETAL:  Normal muscle strength and tone in all four extremities.  No peripheral edema or cyanosis. SKIN: Skin turgor is normal.  There are no pathologic skin lesions.  NEUROLOGIC:  Motor and sensation is grossly normal.  Cranial nerves are grossly intact. PSYCH:  Alert and oriented to person, place and time. Affect is normal.  Laboratory Analysis: Results for orders placed or performed during the hospital encounter of 06/13/17 (from the past 24 hour(s))  Basic metabolic panel     Status: Abnormal   Collection Time: 06/12/17 10:21 PM  Result Value Ref Range   Sodium 140 135 - 145 mmol/L   Potassium 4.2 3.5 - 5.1 mmol/L   Chloride 102 101 - 111 mmol/L   CO2 29 22 - 32 mmol/L   Glucose, Bld 128 (H) 65 - 99 mg/dL   BUN 21 (H) 6 - 20 mg/dL   Creatinine, Ser 1.12 (H) 0.44 - 1.00 mg/dL   Calcium 9.1 8.9 - 10.3 mg/dL   GFR calc non Af Amer 49 (L) >60 mL/min   GFR calc Af Amer 57 (L) >60 mL/min   Anion gap 9 5 - 15  CBC     Status: None   Collection Time: 06/12/17 10:21 PM  Result Value Ref Range   WBC 7.7 3.6 - 11.0 K/uL   RBC 4.28 3.80 - 5.20 MIL/uL   Hemoglobin 12.9 12.0 - 16.0 g/dL   HCT 37.7 35.0 - 47.0 %   MCV 88.1 80.0 - 100.0 fL   MCH 30.2 26.0 - 34.0 pg   MCHC 34.3 32.0 - 36.0 g/dL   RDW 13.0 11.5 - 14.5 %   Platelets 182 150 - 440 K/uL  Troponin I     Status: None   Collection Time: 06/12/17 10:21 PM  Result Value Ref Range   Troponin I <0.03 <0.03 ng/mL  Lipase, blood     Status: None   Collection Time: 06/12/17 10:21 PM  Result Value Ref Range   Lipase 23 11 - 51 U/L  Hepatic function panel     Status: Abnormal   Collection Time: 06/12/17 10:21 PM  Result Value Ref Range   Total Protein 6.8 6.5 - 8.1 g/dL   Albumin 3.6 3.5 - 5.0 g/dL   AST 22 15 - 41 U/L   ALT 17 14 - 54 U/L   Alkaline Phosphatase 55 38 - 126 U/L   Total Bilirubin 0.5 0.3 - 1.2 mg/dL   Bilirubin, Direct <0.1 (L) 0.1 - 0.5 mg/dL   Indirect Bilirubin NOT CALCULATED 0.3 - 0.9 mg/dL    Imaging: Dg Chest 2 View  Result Date: 06/12/2017 CLINICAL DATA:  Epigastric pain, back pain.  Vomiting. EXAM: CHEST  2 VIEW COMPARISON:   Chest x-ray dated 09/09/2016. FINDINGS: Heart size and mediastinal contours are normal. Atherosclerotic changes noted at the aortic arch. Lungs are clear. No pleural effusion or pneumothorax seen. Osseous structures about the chest are unremarkable. IMPRESSION: No active cardiopulmonary disease. No evidence of pneumonia  or pulmonary edema. Electronically Signed   By: Franki Cabot M.D.   On: 06/12/2017 22:48   Ct Renal Stone Study  Result Date: 06/13/2017 CLINICAL DATA:  Flank pain.  Vomiting. EXAM: CT ABDOMEN AND PELVIS WITHOUT CONTRAST TECHNIQUE: Multidetector CT imaging of the abdomen and pelvis was performed following the standard protocol without IV contrast. COMPARISON:  CT abdomen pelvis 01/15/2008 FINDINGS: Lower chest: No pulmonary nodules or pleural effusion. No visible pericardial effusion. Hepatobiliary: Normal hepatic contours and density. No visible biliary dilatation. There is cholelithiasis with a stone isolated at the gallbladder neck. Pancreas: Normal contours without ductal dilatation. No peripancreatic fluid collection. Spleen: Normal. Adrenals/Urinary Tract: --Adrenal glands: Normal. --Right kidney/ureter: No hydronephrosis or perinephric stranding. 2 mm right lower pole nonobstructive nephrolithiasis. No obstructing ureteral stones. --Left kidney/ureter: No hydronephrosis or perinephric stranding. No nephrolithiasis. No obstructing ureteral stones. --Urinary bladder: Unremarkable. Stomach/Bowel: --Stomach/Duodenum: No hiatal hernia or other gastric abnormality. Normal duodenal course and caliber. --Small bowel: No dilatation or inflammation. --Colon: Sigmoid diverticulosis without acute inflammation. --Appendix: Normal. Vascular/Lymphatic: There is atherosclerotic calcification of the non aneurysmal abdominal aorta. No abdominal or pelvic lymphadenopathy. Reproductive: Normal uterus and ovaries. Musculoskeletal. There is grade 1 L5-S1 anterolisthesis with severe L4-L5 and L5-S1 facet  hypertrophy and bilateral L5 pars interarticularis defects. Other: None. IMPRESSION: 1. Cholelithiasis with a stone isolated at the gallbladder neck. In the context of epigastric abdominal pain and vomiting, further evaluation with dedicated ultrasound is recommended to assess for possible cholecystitis. No inflammation or wall thickening is visible on this CT. 2. Nonobstructive right lower pole renal calculus measuring 2 mm. No obstructive uropathy. 3. Sigmoid diverticulosis and Aortic Atherosclerosis (ICD10-I70.0). Electronically Signed   By: Ulyses Jarred M.D.   On: 06/13/2017 02:17   US Abdomen Limited Ruq  Result Date: 06/13/2017 CLINICAL DATA:  Abdominal pain EXAM: ULTRASOUND ABDOMEN LIMITED RIGHT UPPER QUADRANT COMPARISON:  CT 06/13/2017 FINDINGS: Gallbladder: Slightly distended gallbladder. Calcified shadowing stones measuring up to 1.8 cm. Non mobile stone at the neck of the gallbladder. Borderline wall thickening at 3.2 mm. Positive sonographic Murphy. Common bile duct: Diameter: 5 mm Liver: No focal lesion identified. Within normal limits in parenchymal echogenicity. Portal vein is patent on color Doppler imaging with normal direction of blood flow towards the liver. IMPRESSION: 1. Non mobile stone at the neck of the gallbladder; borderline wall thickening with positive sonographic Percell Miller, findings raise suspicion for an acute cholecystitis. No biliary dilatation. Electronically Signed   By: Donavan Foil M.D.   On: 06/13/2017 03:36    Assessment and Plan: This is a 71 y.o. female who presents with acute cholecystitis.  I have independently viewed the patient's imaging studies and reviewed her laboratory studies.  Overall, labs are normal but clinically she is tender in the right upper quadrant and epigastric areas.  Has had nausea and episodes of emesis and her pain has not resolved yet.  Her U/S does show cholelithiasis with some wall thickening, and a stone at the neck of the gallbladder, which  appears to be non-mobile.  The patient reports she was very tender during the ultrasound.  Patient will be admitted to the surgical team.  She will remain NPO with IV fluid hydration.  Will be started on IV antibiotics for cholecystitis.  Will have appropriate pain and nausea control.  Discussed with the patient the role of cholecystectomy to be done today for the management of cholecystitis, the role for laparoscopy vs open procedure as well and the post-op outcomes and restrictions.  Discussed with the patient that Dr. Rosana Hoes would be performing the surgery later today.  She understands this plan and all of her questions have been answered.   Melvyn Neth, [place]

## 2017-06-13 NOTE — Transfer of Care (Signed)
Immediate Anesthesia Transfer of Care Note  Patient: Aleshia Cartelli [name]  Procedure(s) Performed: Procedure(s): LAPAROSCOPIC CHOLECYSTECTOMY (N/A)  Patient Location: PACU  Anesthesia Type:General  Level of Consciousness: sedated  Airway & Oxygen Therapy: Patient Spontanous Breathing and Patient connected to face mask oxygen  Post-op Assessment: Report given to RN and Post -op Vital signs reviewed and stable  Post vital signs: Reviewed and stable  Last Vitals:  Vitals:   06/13/17 1100 06/13/17 1350  BP: 138/62 (!) 141/63  Pulse: (!) 53 69  Resp: 16 (!) 9  Temp: (!) 36.1 C   SpO2: 100% 99%    Last Pain:  Vitals:   06/13/17 1100  TempSrc: Tympanic  PainSc:          Complications: No apparent anesthesia complications

## 2017-06-13 NOTE — Anesthesia Procedure Notes (Signed)
Procedure Name: Intubation Performed by: Demetrius Charity Pre-anesthesia Checklist: Patient identified, Patient being monitored, Timeout performed, Emergency Drugs available and Suction available Patient Re-evaluated:Patient Re-evaluated prior to induction Oxygen Delivery Method: Circle system utilized Preoxygenation: Pre-oxygenation with 100% oxygen Induction Type: IV induction, Rapid sequence and Cricoid Pressure applied Laryngoscope Size: 3 and McGraph Grade View: Grade I Tube type: Oral Tube size: 7.0 mm Number of attempts: 1 Airway Equipment and Method: Stylet Placement Confirmation: ETT inserted through vocal cords under direct vision,  positive ETCO2 and breath sounds checked- equal and bilateral Secured at: 21 cm Tube secured with: Tape Dental Injury: Teeth and Oropharynx as per pre-operative assessment

## 2017-06-14 ENCOUNTER — Encounter: Payer: Self-pay | Admitting: Surgery

## 2017-06-14 LAB — SURGICAL PATHOLOGY

## 2017-06-14 MED ORDER — OXYCODONE-ACETAMINOPHEN 5-325 MG PO TABS: 1.0000 | ORAL_TABLET | ORAL | 0 refills | Status: DC | PRN

## 2017-06-14 NOTE — Discharge Summary (Signed)
Physician Discharge Summary  Patient ID: [name] MRN: [id number] DOB/AGE: Dec 04, 1945 71 y.o.  Admit date: 06/13/2017 Discharge date: 06/14/2017  Admission Diagnoses:  Discharge Diagnoses:  Active Problems:   Cholecystitis   Discharged Condition: good  Hospital Course: 71 y.o. female presented this admission with RUQ > epigastric abdominal pain x 1 day. Ultrasound suggested acute calculous cholecystitis. All risks, benefits, and alternatives to above elective procedures were discussed with the patient, who elected to proceed, and informed consent was accordingly obtained at that time. Patient underwent laparoscopic cholecystectomy Rosana Hoes, 9/18), and post-operatively patient's pain was well-controlled. Advancement of patient's diet and ambulation were well-tolerated, and the remainder of her hospital admission was unremarkable. Discharge planning was accordingly initiated, and patient was safely able to be discharged home with appropriate discharge instructions, pain control, and outpatient surgical follow-up after all of her questions were answered to her expressed satisfaction.  Consults: None  Significant Diagnostic Studies: radiology: Ultrasound: acute cholecystitis  Treatments: IV hydration and surgery: laparoscopic cholecystectomy  Discharge Exam: Blood pressure (!) 131/54, pulse (!) 51, temperature 97.8 F (36.6 C), temperature source Oral, resp. rate 19, height 5\' 7"  (1.702 m), weight 217 lb (98.4 kg), SpO2 95 %. General appearance: alert, cooperative and no distress GI: abdomen soft and non-distended with appropriate mild-/moderate- epigastric peri-incisional tenderness to palpation, incisions well-approximated without erythema or drainage  Disposition:    Allergies as of 06/14/2017      Reactions   Atorvastatin    REACTION: Upset stomach      Medication List    TAKE these medications   aspirin 81 MG tablet Take 81 mg by mouth daily.   escitalopram 10 MG  tablet Commonly known as:  LEXAPRO Take 1 tablet (10 mg total) by mouth daily.   oxyCODONE-acetaminophen 5-325 MG tablet Commonly known as:  PERCOCET/ROXICET Take 1-2 tablets by mouth every 4 (four) hours as needed for severe pain.            Discharge Care Instructions        Start     Ordered   06/14/17 0000  oxyCODONE-acetaminophen (PERCOCET/ROXICET) 5-325 MG tablet  Every 4 hours PRN     06/14/17 0829     Follow-up [facility]. Go on 06/28/2017.   Specialty:  General Surgery Why:  Dr. Dahlia Byes, Wednesday, 10/3 at 9:15 a.m.  [phone], [place] office Contact information: [place] [phone]          Signed: Vickie Epley 06/14/2017, 10:30 AM

## 2017-06-19 ENCOUNTER — Telehealth: Payer: Self-pay | Admitting: Internal Medicine

## 2017-06-19 DIAGNOSIS — R7303 Prediabetes: Secondary | ICD-10-CM

## 2017-06-19 DIAGNOSIS — E038 Other specified hypothyroidism: Secondary | ICD-10-CM

## 2017-06-19 DIAGNOSIS — Z0000 Encounter for general adult medical examination without abnormal findings: Secondary | ICD-10-CM

## 2017-06-19 DIAGNOSIS — E785 Hyperlipidemia, unspecified: Secondary | ICD-10-CM

## 2017-06-19 DIAGNOSIS — E039 Hypothyroidism, unspecified: Secondary | ICD-10-CM

## 2017-06-19 NOTE — Telephone Encounter (Signed)
Pt would like orders put in the system for her lab work to be completed before her appointment on 9/28 Please advise and call when done

## 2017-06-20 NOTE — Telephone Encounter (Signed)
Spoke with pt to inform.  

## 2017-06-20 NOTE — Telephone Encounter (Signed)
ordered

## 2017-06-21 ENCOUNTER — Other Ambulatory Visit (INDEPENDENT_AMBULATORY_CARE_PROVIDER_SITE_OTHER): Payer: PPO

## 2017-06-21 DIAGNOSIS — E039 Hypothyroidism, unspecified: Secondary | ICD-10-CM

## 2017-06-21 DIAGNOSIS — Z0000 Encounter for general adult medical examination without abnormal findings: Secondary | ICD-10-CM | POA: Diagnosis not present

## 2017-06-21 DIAGNOSIS — E785 Hyperlipidemia, unspecified: Secondary | ICD-10-CM

## 2017-06-21 DIAGNOSIS — E038 Other specified hypothyroidism: Secondary | ICD-10-CM

## 2017-06-21 LAB — LIPID PANEL
Cholesterol: 208 mg/dL — ABNORMAL HIGH (ref 0–200)
HDL: 64.3 mg/dL (ref >39.00)
LDL CALC: 123 mg/dL — AB (ref 0–99)
NONHDL: 143.7
Total CHOL/HDL Ratio: 3
Triglycerides: 103 mg/dL (ref 0.0–149.0)
VLDL: 20.6 mg/dL (ref 0.0–40.0)

## 2017-06-21 LAB — CBC WITH DIFFERENTIAL/PLATELET
BASOS ABS: 0.1 10*3/uL (ref 0.0–0.1)
Basophils Relative: 0.9 % (ref 0.0–3.0)
EOS ABS: 0.3 10*3/uL (ref 0.0–0.7)
EOS PCT: 3.9 % (ref 0.0–5.0)
HCT: 40.2 % (ref 36.0–46.0)
HEMOGLOBIN: 13.5 g/dL (ref 12.0–15.0)
Lymphocytes Relative: 31.6 % (ref 12.0–46.0)
Lymphs Abs: 2.5 10*3/uL (ref 0.7–4.0)
MCHC: 33.6 g/dL (ref 30.0–36.0)
MCV: 90.5 fl (ref 78.0–100.0)
MONO ABS: 0.6 10*3/uL (ref 0.1–1.0)
Monocytes Relative: 7.4 % (ref 3.0–12.0)
Neutro Abs: 4.4 10*3/uL (ref 1.4–7.7)
Neutrophils Relative %: 56.2 % (ref 43.0–77.0)
Platelets: 198 10*3/uL (ref 150.0–400.0)
RBC: 4.44 Mil/uL (ref 3.87–5.11)
RDW: 13 % (ref 11.5–15.5)
WBC: 7.9 10*3/uL (ref 4.0–10.5)

## 2017-06-21 LAB — COMPREHENSIVE METABOLIC PANEL
ALBUMIN: 3.9 g/dL (ref 3.5–5.2)
ALK PHOS: 63 U/L (ref 39–117)
ALT: 22 U/L (ref 0–35)
AST: 15 U/L (ref 0–37)
BUN: 25 mg/dL — AB (ref 6–23)
CO2: 28 mEq/L (ref 19–32)
CREATININE: 0.96 mg/dL (ref 0.40–1.20)
Calcium: 9.2 mg/dL (ref 8.4–10.5)
Chloride: 102 mEq/L (ref 96–112)
GFR: 61.12 mL/min (ref >60.00)
Glucose, Bld: 104 mg/dL — ABNORMAL HIGH (ref 70–99)
Potassium: 4.9 mEq/L (ref 3.5–5.1)
SODIUM: 136 meq/L (ref 135–145)
TOTAL PROTEIN: 7.1 g/dL (ref 6.0–8.3)
Total Bilirubin: 0.4 mg/dL (ref 0.2–1.2)

## 2017-06-21 LAB — TSH: TSH: 4.77 u[IU]/mL — AB (ref 0.35–4.50)

## 2017-06-22 NOTE — Progress Notes (Signed)
Subjective:    Patient ID: [name], female    DOB: [birth year], 71 y.o.   MRN: [id number]  HPI She is here for a physical exam.   She had her GB removed two weeks ago.  She has some residual abdominal pain.    Medications and allergies reviewed with patient and updated if appropriate.  Patient Active Problem List   Diagnosis Date Noted  . Anxiety and depression 09/11/2016  . Loss of weight 09/11/2016  . Thoracic back pain 09/11/2016  . Prediabetes 06/21/2016  . Subclinical hypothyroidism 05/17/2011  . GERD 01/17/2009  . Hyperlipidemia 12/03/2007  . NEPHROLITHIASIS, HX OF 12/03/2007    Current Outpatient Prescriptions on File Prior to Visit  Medication Sig Dispense Refill  . aspirin 81 MG tablet Take 81 mg by mouth daily.       No current facility-administered medications on file prior to visit.     Past Medical History:  Diagnosis Date  . Borderline diabetes mellitus   . Chest pain   . GERD (gastroesophageal reflux disease)   . Grief reaction   . Hyperlipidemia   . Palpitations   . Personal history of urinary calculi   . 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 Weakness     Past Surgical History:  Procedure Laterality Date  . CHOLECYSTECTOMY N/A 06/13/2017   Procedure: LAPAROSCOPIC CHOLECYSTECTOMY;  Surgeon: Vickie Epley, MD;  Location: [facility] ORS;  Service: General;  Laterality: N/A;  . MASS EXCISION     right upper inner thigh or cyst    Social History   Social History  . Marital status: Widowed    Spouse name: N/A  . Number of children: 0  . Years of education: N/A   Social History Main Topics  . Smoking status: Never Smoker  . Smokeless tobacco: Never Used  . Alcohol use No  . Drug use: No  . Sexual activity: Not on file   Other Topics Concern  . Not on file   Social History Narrative   Retired Merchandiser, retail for [employer]   Married (husband - Eddie Dibbles), no children   Never Smoked    Alcohol  use-no      Daily Caffeine Use 2/day    Family History  Problem Relation Age of Onset  . COPD Mother        deceased 15-Nov-2008  . Alzheimer's disease Father        deceased 09-15-2009  . Coronary artery disease Father        s/p cabg    Review of Systems  Constitutional: Negative for chills and fever.  Eyes: Negative for visual disturbance.  Respiratory: Negative for cough, shortness of breath and wheezing.   Cardiovascular: Positive for leg swelling (right ankle - goes to vein surgery). Negative for chest pain and palpitations.  Gastrointestinal: Positive for abdominal pain (yes following surgery) and diarrhea. Negative for blood in stool and nausea.  Genitourinary: Negative for dysuria and hematuria.  Musculoskeletal: Negative for arthralgias.  Skin: Negative for color change and rash.  Neurological: Negative for light-headedness and headaches.  Psychiatric/Behavioral: Positive for dysphoric mood (related to husband's death - does not feel she needs medication) and hallucinations. The patient is not nervous/anxious.        Objective:   Vitals:   06/23/17 0922  BP: (!) 144/72  Pulse: 66  Resp: 16  Temp: 98.6 F (37 C)  SpO2: 98%  Filed Weights   06/23/17 0922  Weight: 218 lb (98.9 kg)   Body mass index is 34.14 kg/m.  Wt Readings from Last 3 Encounters:  06/23/17 218 lb (98.9 kg)  06/13/17 217 lb (98.4 kg)  09/09/16 223 lb (101.2 kg)     Physical Exam Constitutional: She appears well-developed and well-nourished. No distress.  HENT:  Head: Normocephalic and atraumatic.  Right Ear: External ear normal. Normal ear canal and TM Left Ear: External ear normal.  Normal ear canal and TM Mouth/Throat: Oropharynx is clear and moist.  Eyes: Conjunctivae and EOM are normal.  Neck: Neck supple. No tracheal deviation present. No thyromegaly present.  No carotid bruit  Cardiovascular: Normal rate, regular rhythm and normal heart sounds.   No murmur heard.  No  edema. Pulmonary/Chest: Effort normal and breath sounds normal. No respiratory distress. She has no wheezes. She has no rales.  Breast: deferred to Gyn Abdominal: Soft. Laparoscopic scars healing well.  She exhibits no distension. There is no tenderness.  Lymphadenopathy: She has no cervical adenopathy.  Skin: Skin is warm and dry. She is not diaphoretic.  Psychiatric: She has a normal mood and affect. Her behavior is normal.         Assessment & Plan:   Physical exam: Screening blood work  reviewed Immunizations  Flu and prevnar vaccine today, Td, and shingrix vaccines discussed Colonoscopy    Up to date  Mammogram   Up to date  27 - Up to date - Dr Laurin Coder Dexa  Due - she will schedule Eye exams   Up to date  EKG  Done 06/15/17 Exercise  none Weight  Advised weight loss Skin  Sees Dr [place] Substance abuse    none  See Problem List for Assessment and Plan of chronic medical problems.  FU annually

## 2017-06-22 NOTE — Patient Instructions (Addendum)
Consider getting the new shingles vaccine at your pharmacy.   Consider getting a tetanus vaccine at your pharmacy.    Schedule a dexa scan/bone density.    Have repeat blood work in 2-3 months to recheck your thyroid, which was slightly elevated.    We reviewed your blood work.   All other Health Maintenance issues reviewed.   All recommended immunizations and age-appropriate screenings are up-to-date or discussed.  Flu and prevnar pneumonia immunizations administered today.   Medications reviewed and updated.  No changes recommended at this time.   Please followup in one year   Health Maintenance, Female Adopting a healthy lifestyle and getting preventive care can go a long way to promote health and wellness. Talk with your health care provider about what schedule of regular examinations is right for you. This is a good chance for you to check in with your provider about disease prevention and staying healthy. In between checkups, there are plenty of things you can do on your own. Experts have done a lot of research about which lifestyle changes and preventive measures are most likely to keep you healthy. Ask your health care provider for more information. Weight and diet Eat a healthy diet  Be sure to include plenty of vegetables, fruits, low-fat dairy products, and lean protein.  Do not eat a lot of foods high in solid fats, added sugars, or salt.  Get regular exercise. This is one of the most important things you can do for your health. ? Most adults should exercise for at least 150 minutes each week. The exercise should increase your heart rate and make you sweat (moderate-intensity exercise). ? Most adults should also do strengthening exercises at least twice a week. This is in addition to the moderate-intensity exercise.  Maintain a healthy weight  Body mass index (BMI) is a measurement that can be used to identify possible weight problems. It estimates body fat based on  height and weight. Your health care provider can help determine your BMI and help you achieve or maintain a healthy weight.  For females 54 years of age and older: ? A BMI below 18.5 is considered underweight. ? A BMI of 18.5 to 24.9 is normal. ? A BMI of 25 to 29.9 is considered overweight. ? A BMI of 30 and above is considered obese.  Watch levels of cholesterol and blood lipids  You should start having your blood tested for lipids and cholesterol at 69 years of age, then have this test every 5 years.  You may need to have your cholesterol levels checked more often if: ? Your lipid or cholesterol levels are high. ? You are older than 69 years of age. ? You are at high risk for heart disease.  Cancer screening Lung Cancer  Lung cancer screening is recommended for adults 33-14 years old who are at high risk for lung cancer because of a history of smoking.  A yearly low-dose CT scan of the lungs is recommended for people who: ? Currently smoke. ? Have quit within the past 15 years. ? Have at least a 30-pack-year history of smoking. A pack year is smoking an average of one pack of cigarettes a day for 1 year.  Yearly screening should continue until it has been 15 years since you quit.  Yearly screening should stop if you develop a health problem that would prevent you from having lung cancer treatment.  Breast Cancer  Practice breast self-awareness. This means understanding how your breasts normally  appear and feel.  It also means doing regular breast self-exams. Let your health care provider know about any changes, no matter how small.  If you are in your 20s or 30s, you should have a clinical breast exam (CBE) by a health care provider every 1-3 years as part of a regular health exam.  If you are 33 or older, have a CBE every year. Also consider having a breast X-ray (mammogram) every year.  If you have a family history of breast cancer, talk to your health care provider  about genetic screening.  If you are at high risk for breast cancer, talk to your health care provider about having an MRI and a mammogram every year.  Breast cancer gene (BRCA) assessment is recommended for women who have family members with BRCA-related cancers. BRCA-related cancers include: ? Breast. ? Ovarian. ? Tubal. ? Peritoneal cancers.  Results of the assessment will determine the need for genetic counseling and BRCA1 and BRCA2 testing.  Cervical Cancer Your health care provider may recommend that you be screened regularly for cancer of the pelvic organs (ovaries, uterus, and vagina). This screening involves a pelvic examination, including checking for microscopic changes to the surface of your cervix (Pap test). You may be encouraged to have this screening done every 3 years, beginning at age 83.  For women ages 69-65, health care providers may recommend pelvic exams and Pap testing every 3 years, or they may recommend the Pap and pelvic exam, combined with testing for human papilloma virus (HPV), every 5 years. Some types of HPV increase your risk of cervical cancer. Testing for HPV may also be done on women of any age with unclear Pap test results.  Other health care providers may not recommend any screening for nonpregnant women who are considered low risk for pelvic cancer and who do not have symptoms. Ask your health care provider if a screening pelvic exam is right for you.  If you have had past treatment for cervical cancer or a condition that could lead to cancer, you need Pap tests and screening for cancer for at least 20 years after your treatment. If Pap tests have been discontinued, your risk factors (such as having a new sexual partner) need to be reassessed to determine if screening should resume. Some women have medical problems that increase the chance of getting cervical cancer. In these cases, your health care provider may recommend more frequent screening and Pap  tests.  Colorectal Cancer  This type of cancer can be detected and often prevented.  Routine colorectal cancer screening usually begins at 69 years of age and continues through 69 years of age.  Your health care provider may recommend screening at an earlier age if you have risk factors for colon cancer.  Your health care provider may also recommend using home test kits to check for hidden blood in the stool.  A small camera at the end of a tube can be used to examine your colon directly (sigmoidoscopy or colonoscopy). This is done to check for the earliest forms of colorectal cancer.  Routine screening usually begins at age 72.  Direct examination of the colon should be repeated every 5-10 years through 69 years of age. However, you may need to be screened more often if early forms of precancerous polyps or small growths are found.  Skin Cancer  Check your skin from head to toe regularly.  Tell your health care provider about any new moles or changes in moles, especially  if there is a change in a mole's shape or color.  Also tell your health care provider if you have a mole that is larger than the size of a pencil eraser.  Always use sunscreen. Apply sunscreen liberally and repeatedly throughout the day.  Protect yourself by wearing long sleeves, pants, a wide-brimmed hat, and sunglasses whenever you are outside.  Heart disease, diabetes, and high blood pressure  High blood pressure causes heart disease and increases the risk of stroke. High blood pressure is more likely to develop in: ? People who have blood pressure in the high end of the normal range (130-139/85-89 mm Hg). ? People who are overweight or obese. ? People who are African American.  If you are 75-74 years of age, have your blood pressure checked every 3-5 years. If you are 65 years of age or older, have your blood pressure checked every year. You should have your blood pressure measured twice-once when you are at  a hospital or clinic, and once when you are not at a hospital or clinic. Record the average of the two measurements. To check your blood pressure when you are not at a hospital or clinic, you can use: ? An automated blood pressure machine at a pharmacy. ? A home blood pressure monitor.  If you are between 55 years and 71 years old, ask your health care provider if you should take aspirin to prevent strokes.  Have regular diabetes screenings. This involves taking a blood sample to check your fasting blood sugar level. ? If you are at a normal weight and have a low risk for diabetes, have this test once every three years after 69 years of age. ? If you are overweight and have a high risk for diabetes, consider being tested at a younger age or more often. Preventing infection Hepatitis B  If you have a higher risk for hepatitis B, you should be screened for this virus. You are considered at high risk for hepatitis B if: ? You were born in a country where hepatitis B is common. Ask your health care provider which countries are considered high risk. ? Your parents were born in a high-risk country, and you have not been immunized against hepatitis B (hepatitis B vaccine). ? You have HIV or AIDS. ? You use needles to inject street drugs. ? You live with someone who has hepatitis B. ? You have had sex with someone who has hepatitis B. ? You get hemodialysis treatment. ? You take certain medicines for conditions, including cancer, organ transplantation, and autoimmune conditions.  Hepatitis C  Blood testing is recommended for: ? Everyone born from [birth year] through 1963. ? Anyone with known risk factors for hepatitis C.  Sexually transmitted infections (STIs)  You should be screened for sexually transmitted infections (STIs) including gonorrhea and chlamydia if: ? You are sexually active and are younger than 69 years of age. ? You are older than 69 years of age and your health care provider tells  you that you are at risk for this type of infection. ? Your sexual activity has changed since you were last screened and you are at an increased risk for chlamydia or gonorrhea. Ask your health care provider if you are at risk.  If you do not have HIV, but are at risk, it may be recommended that you take a prescription medicine daily to prevent HIV infection. This is called pre-exposure prophylaxis (PrEP). You are considered at risk if: ? You are sexually active  and do not regularly use condoms or know the HIV status of your partner(s). ? You take drugs by injection. ? You are sexually active with a partner who has HIV.  Talk with your health care provider about whether you are at high risk of being infected with HIV. If you choose to begin PrEP, you should first be tested for HIV. You should then be tested every 3 months for as long as you are taking PrEP. Pregnancy  If you are premenopausal and you may become pregnant, ask your health care provider about preconception counseling.  If you may become pregnant, take 400 to 800 micrograms (mcg) of folic acid every day.  If you want to prevent pregnancy, talk to your health care provider about birth control (contraception). Osteoporosis and menopause  Osteoporosis is a disease in which the bones lose minerals and strength with aging. This can result in serious bone fractures. Your risk for osteoporosis can be identified using a bone density scan.  If you are 47 years of age or older, or if you are at risk for osteoporosis and fractures, ask your health care provider if you should be screened.  Ask your health care provider whether you should take a calcium or vitamin D supplement to lower your risk for osteoporosis.  Menopause may have certain physical symptoms and risks.  Hormone replacement therapy may reduce some of these symptoms and risks. Talk to your health care provider about whether hormone replacement therapy is right for  you. Follow these instructions at home:  Schedule regular health, dental, and eye exams.  Stay current with your immunizations.  Do not use any tobacco products including cigarettes, chewing tobacco, or electronic cigarettes.  If you are pregnant, do not drink alcohol.  If you are breastfeeding, limit how much and how often you drink alcohol.  Limit alcohol intake to no more than 1 drink per day for nonpregnant women. One drink equals 12 ounces of beer, 5 ounces of wine, or 1 ounces of hard liquor.  Do not use street drugs.  Do not share needles.  Ask your health care provider for help if you need support or information about quitting drugs.  Tell your health care provider if you often feel depressed.  Tell your health care provider if you have ever been abused or do not feel safe at home. This information is not intended to replace advice given to you by your health care provider. Make sure you discuss any questions you have with your health care provider. Document Released: 03/28/2011 Document Revised: 02/18/2016 Document Reviewed: 06/16/2015 Elsevier Interactive Patient Education  [employer].

## 2017-06-23 ENCOUNTER — Ambulatory Visit (INDEPENDENT_AMBULATORY_CARE_PROVIDER_SITE_OTHER): Payer: PPO | Admitting: Internal Medicine

## 2017-06-23 ENCOUNTER — Encounter: Payer: Self-pay | Admitting: Internal Medicine

## 2017-06-23 VITALS — BP 144/72 | HR 66 | Temp 98.6°F | Resp 16 | Ht 67.0 in | Wt 218.0 lb

## 2017-06-23 DIAGNOSIS — R7989 Other specified abnormal findings of blood chemistry: Secondary | ICD-10-CM

## 2017-06-23 DIAGNOSIS — E039 Hypothyroidism, unspecified: Secondary | ICD-10-CM | POA: Diagnosis not present

## 2017-06-23 DIAGNOSIS — Z23 Encounter for immunization: Secondary | ICD-10-CM | POA: Diagnosis not present

## 2017-06-23 DIAGNOSIS — I872 Venous insufficiency (chronic) (peripheral): Secondary | ICD-10-CM | POA: Diagnosis not present

## 2017-06-23 DIAGNOSIS — R946 Abnormal results of thyroid function studies: Secondary | ICD-10-CM

## 2017-06-23 DIAGNOSIS — F32A Depression, unspecified: Secondary | ICD-10-CM

## 2017-06-23 DIAGNOSIS — F329 Major depressive disorder, single episode, unspecified: Secondary | ICD-10-CM

## 2017-06-23 DIAGNOSIS — Z0000 Encounter for general adult medical examination without abnormal findings: Secondary | ICD-10-CM

## 2017-06-23 DIAGNOSIS — R7303 Prediabetes: Secondary | ICD-10-CM | POA: Diagnosis not present

## 2017-06-23 DIAGNOSIS — F419 Anxiety disorder, unspecified: Secondary | ICD-10-CM

## 2017-06-23 DIAGNOSIS — E038 Other specified hypothyroidism: Secondary | ICD-10-CM

## 2017-06-23 HISTORY — DX: Venous insufficiency (chronic) (peripheral): I87.2

## 2017-06-23 HISTORY — DX: Other specified abnormal findings of blood chemistry: R79.89

## 2017-06-23 NOTE — Assessment & Plan Note (Signed)
Mild hyperglycemia Will monitor Encouraged exercise, weight loss

## 2017-06-23 NOTE — Assessment & Plan Note (Addendum)
Husband died April 2018 Stopped lexapro months ago Doing ok without medications

## 2017-06-23 NOTE — Assessment & Plan Note (Signed)
tsh elevated - mild Recheck tsh in 2-3 months Has been on medication in the past

## 2017-06-23 NOTE — Assessment & Plan Note (Signed)
Mild edema and discomfort Following with vascular

## 2017-06-23 NOTE — Assessment & Plan Note (Signed)
slightly elevated - will recheck tsh in 2-3 months

## 2017-06-28 ENCOUNTER — Encounter: Payer: Self-pay | Admitting: Surgery

## 2017-06-28 ENCOUNTER — Ambulatory Visit (INDEPENDENT_AMBULATORY_CARE_PROVIDER_SITE_OTHER): Payer: PPO | Admitting: Surgery

## 2017-06-28 VITALS — BP 140/86 | HR 80 | Temp 98.3°F | Wt 223.0 lb

## 2017-06-28 DIAGNOSIS — Z09 Encounter for follow-up examination after completed treatment for conditions other than malignant neoplasm: Secondary | ICD-10-CM

## 2017-06-28 NOTE — Progress Notes (Signed)
S/p lap chole by Dr. Rosana Hoes D/W her about path Taking po, afebrile She has been non compliant w lifting restrictions  PE: NAD Abd: soft, nt, incisions c/d/i. No infection  A/P Doing well Reinforce lifting recommendations RTC prn

## 2017-06-28 NOTE — Patient Instructions (Signed)

## 2017-06-29 DIAGNOSIS — I83811 Varicose veins of right lower extremities with pain: Secondary | ICD-10-CM | POA: Diagnosis not present

## 2017-06-29 DIAGNOSIS — I8311 Varicose veins of right lower extremity with inflammation: Secondary | ICD-10-CM | POA: Diagnosis not present

## 2017-07-13 ENCOUNTER — Telehealth: Payer: Self-pay | Admitting: Surgery

## 2017-07-13 NOTE — Telephone Encounter (Signed)
Patients calling she had surgery done on 06/13/17 she had a laparoscopic cholecystectomy with Dr. Rosana Hoes. Patient is complaining of hurting when she bends over, tender when she sleeps on right or left side.patient said she hasn't been eating a healthy diet either. She's asking if this is normal. Please call patient and advice.

## 2017-07-13 NOTE — Telephone Encounter (Signed)
I called patient back and she explained what she had been experiencing. She stated that when she sleeps on her right or left side at night, she can not get comfortable because she is still tender on her abdomen. Patient also stated that her abdomen hurts every time she bends down to reach for something. Patient denied having a fever, nausea, vomiting or problems with her bowel movements. I told patient that it sounded like it was scar tissue and nothing to worry about. However, I told her that I would ask Dr. Rosana Hoes if there was any way that he could see her next week to exam her and make that she was doing well. Patient agreed and will be waiting on my call.

## 2017-07-17 NOTE — Telephone Encounter (Signed)
Called patient to let her know that Dr. Rosana Hoes would see her today. She stated she started to feel better after we spoke on Thursday. She also stated that she was able to sleep on her sides with no pain. I told her that if she had further questions or concerns to please call us back. Patient understood.

## 2017-07-18 DIAGNOSIS — I83811 Varicose veins of right lower extremities with pain: Secondary | ICD-10-CM | POA: Diagnosis not present

## 2017-07-18 DIAGNOSIS — I8311 Varicose veins of right lower extremity with inflammation: Secondary | ICD-10-CM | POA: Diagnosis not present

## 2017-08-02 DIAGNOSIS — I83812 Varicose veins of left lower extremities with pain: Secondary | ICD-10-CM | POA: Diagnosis not present

## 2017-08-02 DIAGNOSIS — I8312 Varicose veins of left lower extremity with inflammation: Secondary | ICD-10-CM | POA: Diagnosis not present

## 2017-08-31 DIAGNOSIS — I83812 Varicose veins of left lower extremities with pain: Secondary | ICD-10-CM | POA: Diagnosis not present

## 2017-08-31 DIAGNOSIS — I8312 Varicose veins of left lower extremity with inflammation: Secondary | ICD-10-CM | POA: Diagnosis not present

## 2017-10-03 ENCOUNTER — Other Ambulatory Visit (INDEPENDENT_AMBULATORY_CARE_PROVIDER_SITE_OTHER): Payer: PPO

## 2017-10-03 DIAGNOSIS — M7981 Nontraumatic hematoma of soft tissue: Secondary | ICD-10-CM | POA: Diagnosis not present

## 2017-10-03 DIAGNOSIS — I83812 Varicose veins of left lower extremities with pain: Secondary | ICD-10-CM | POA: Diagnosis not present

## 2017-10-03 DIAGNOSIS — R7989 Other specified abnormal findings of blood chemistry: Secondary | ICD-10-CM | POA: Diagnosis not present

## 2017-10-03 LAB — TSH: TSH: 5.21 u[IU]/mL — ABNORMAL HIGH (ref 0.35–4.50)

## 2017-10-06 ENCOUNTER — Other Ambulatory Visit: Payer: Self-pay | Admitting: Emergency Medicine

## 2017-10-06 DIAGNOSIS — E038 Other specified hypothyroidism: Secondary | ICD-10-CM

## 2017-10-06 DIAGNOSIS — E039 Hypothyroidism, unspecified: Secondary | ICD-10-CM

## 2017-10-06 MED ORDER — LEVOTHYROXINE SODIUM 25 MCG PO TABS: 25.0000 ug | ORAL_TABLET | Freq: Every day | ORAL | 2 refills | Status: DC

## 2017-10-18 DIAGNOSIS — I8312 Varicose veins of left lower extremity with inflammation: Secondary | ICD-10-CM | POA: Diagnosis not present

## 2017-11-02 DIAGNOSIS — I8311 Varicose veins of right lower extremity with inflammation: Secondary | ICD-10-CM | POA: Diagnosis not present

## 2017-12-26 DIAGNOSIS — L820 Inflamed seborrheic keratosis: Secondary | ICD-10-CM | POA: Diagnosis not present

## 2017-12-26 DIAGNOSIS — Z85828 Personal history of other malignant neoplasm of skin: Secondary | ICD-10-CM | POA: Diagnosis not present

## 2018-02-07 ENCOUNTER — Other Ambulatory Visit: Payer: Self-pay | Admitting: Internal Medicine

## 2018-03-06 DIAGNOSIS — H16223 Keratoconjunctivitis sicca, not specified as Sjogren's, bilateral: Secondary | ICD-10-CM | POA: Diagnosis not present

## 2018-06-12 DIAGNOSIS — L821 Other seborrheic keratosis: Secondary | ICD-10-CM | POA: Diagnosis not present

## 2018-06-12 DIAGNOSIS — L723 Sebaceous cyst: Secondary | ICD-10-CM | POA: Diagnosis not present

## 2018-06-12 DIAGNOSIS — L570 Actinic keratosis: Secondary | ICD-10-CM | POA: Diagnosis not present

## 2018-06-12 DIAGNOSIS — Z85828 Personal history of other malignant neoplasm of skin: Secondary | ICD-10-CM | POA: Diagnosis not present

## 2018-06-12 DIAGNOSIS — Z419 Encounter for procedure for purposes other than remedying health state, unspecified: Secondary | ICD-10-CM | POA: Diagnosis not present

## 2018-06-26 NOTE — Progress Notes (Signed)
Subjective:    Patient ID: [name], female    DOB: [birth year], 72 y.o.   MRN: [id number]  HPI She is here for a physical exam.   She is anxious.  She is dating someone and anxious about it.  She  Feels stressed because she feels she is not measuring up.  He is always looking at other women.  She does want to be with someone.  He is very different from her late husband.  She know she does not have the confidence she should.      Medications and allergies reviewed with patient and updated if appropriate.  Patient Active Problem List   Diagnosis Date Noted  . Venous insufficiency of right leg 06/23/2017  . Anxiety and depression 09/11/2016  . Prediabetes 06/21/2016  . Hypothyroidism (acquired) 05/17/2011  . NEPHROLITHIASIS, HX OF 12/03/2007    Current Outpatient Medications on File Prior to Visit  Medication Sig Dispense Refill  . aspirin 81 MG tablet Take 81 mg by mouth daily.       No current facility-administered medications on file prior to visit.     Past Medical History:  Diagnosis Date  . Anxiety and depression 09/11/2016  . Borderline diabetes mellitus   . Chest pain   . Elevated TSH 06/23/2017  . GERD (gastroesophageal reflux disease)   . Grief reaction   . Hyperlipidemia   . Palpitations   . Personal history of urinary calculi   . Prediabetes 06/21/2016  . 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 Subclinical hypothyroidism 05/17/2011  . Venous insufficiency of right leg 06/23/2017   Follows with vascular  . Weakness     Past Surgical History:  Procedure Laterality Date  . CHOLECYSTECTOMY N/A 06/13/2017   Procedure: LAPAROSCOPIC CHOLECYSTECTOMY;  Surgeon: Vickie Epley, MD;  Location: [facility] ORS;  Service: General;  Laterality: N/A;  . MASS EXCISION     right upper inner thigh or cyst    Social History   Socioeconomic History  . Marital status: Widowed    Spouse name: Not on file  . Number of children: 0  . Years of  education: Not on file  . Highest education level: Not on file  Occupational History  . Not on file  Social Needs  . Financial resource strain: Not on file  . Food insecurity:    Worry: Not on file    Inability: Not on file  . Transportation needs:    Medical: Not on file    Non-medical: Not on file  Tobacco Use  . Smoking status: Never Smoker  . Smokeless tobacco: Never Used  Substance and Sexual Activity  . Alcohol use: No  . Drug use: No  . Sexual activity: Not on file  Lifestyle  . Physical activity:    Days per week: Not on file    Minutes per session: Not on file  . Stress: Not on file  Relationships  . Social connections:    Talks on phone: Not on file    Gets together: Not on file    Attends religious service: Not on file    Active member of club or organization: Not on file    Attends meetings of clubs or organizations: Not on file    Relationship status: Not on file  Other Topics Concern  . 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 COPD Mother        deceased 2008-11-13  . Alzheimer's disease Father        deceased 09-13-09  . Coronary artery disease Father        s/p cabg    Review of Systems  Constitutional: Negative for chills and fever.  Eyes: Negative for visual disturbance.  Respiratory: Negative for cough, shortness of breath and wheezing.   Cardiovascular: Positive for leg swelling (left ankle at times). Negative for chest pain and palpitations.  Gastrointestinal: Negative for abdominal pain, blood in stool, constipation, diarrhea and nausea.       Gerd  Genitourinary: Positive for vaginal discharge (minimal). Negative for dysuria and hematuria.  Musculoskeletal: Positive for arthralgias (mild).  Skin: Negative for color change and  rash.  Neurological: Negative for headaches.  Psychiatric/Behavioral: Positive for dysphoric mood. The patient is nervous/anxious.        Objective:   Vitals:   06/27/18 0809  BP: (!) 142/82  Pulse: 61  Resp: 16  Temp: 98.5 F (36.9 C)  SpO2: 98%   Filed Weights   06/27/18 0809  Weight: 229 lb 12.8 oz (104.2 kg)   Body mass index is 35.99 kg/m.  BP Readings from Last 3 Encounters:  06/27/18 (!) 142/82  06/28/17 140/86  06/23/17 (!) 144/72    Wt Readings from Last 3 Encounters:  06/27/18 229 lb 12.8 oz (104.2 kg)  06/28/17 223 lb (101.2 kg)  06/23/17 218 lb (98.9 kg)     Physical Exam Constitutional: She appears well-developed and well-nourished. No distress.  HENT:  Head: Normocephalic and atraumatic.  Right Ear: External ear normal. Normal ear canal and TM Left Ear: External ear normal.  Normal ear canal and TM Mouth/Throat: Oropharynx is clear and moist.  Eyes: Conjunctivae and EOM are normal.  Neck: Neck supple. No tracheal deviation present. No thyromegaly present.  No carotid bruit  Cardiovascular: Normal rate, regular rhythm and normal heart sounds.   No murmur heard.  No edema. Pulmonary/Chest: Effort normal and breath sounds normal. No respiratory distress. She has no wheezes. She has no rales.  Breast: deferred to Gyn Abdominal: Soft. She exhibits no distension. There is no tenderness.  Lymphadenopathy: She has no cervical adenopathy.  Skin: Skin is warm and dry. She is not diaphoretic.  Psychiatric: She has a anxious mood and affect. Her behavior is normal.        Assessment & Plan:   Physical exam: Screening blood work ordered Immunizations   tdap due, flu vaccine deferred, discussed shingles Colonoscopy   Up to date  Mammogram   Up to date  Gyn  Up to date, sexually active  Dexa  ordered Eye exams  Up to date  EKG   Done 05/2017 Exercise   Not regularly Weight  Advised weight loss -  Trying to lose weight Skin  No concerns Substance  abuse   none  See Problem List for Assessment and Plan of chronic medical problems.    FU in one year

## 2018-06-26 NOTE — Patient Instructions (Addendum)
Tests ordered today. Your results will be released to [facility] (or called to you) after review, usually within 72hours after test completion. If any changes need to be made, you will be notified at that same time.  All other Health Maintenance issues reviewed.   All recommended immunizations and age-appropriate screenings are up-to-date or discussed.  No immunizations administered today.   Medications reviewed and updated.  Changes include :   Start lexapro 10 mg daily  Your prescription(s) have been submitted to your pharmacy. Please take as directed and contact our office if you believe you are having problem(s) with the medication(s).  Please followup in one year, sooner if needed   Health Maintenance, Female Adopting a healthy lifestyle and getting preventive care can go a long way to promote health and wellness. Talk with your health care provider about what schedule of regular examinations is right for you. This is a good chance for you to check in with your provider about disease prevention and staying healthy. In between checkups, there are plenty of things you can do on your own. Experts have done a lot of research about which lifestyle changes and preventive measures are most likely to keep you healthy. Ask your health care provider for more information. Weight and diet Eat a healthy diet  Be sure to include plenty of vegetables, fruits, low-fat dairy products, and lean protein.  Do not eat a lot of foods high in solid fats, added sugars, or salt.  Get regular exercise. This is one of the most important things you can do for your health. ? Most adults should exercise for at least 150 minutes each week. The exercise should increase your heart rate and make you sweat (moderate-intensity exercise). ? Most adults should also do strengthening exercises at least twice a week. This is in addition to the moderate-intensity exercise.  Maintain a healthy weight  Body mass index (BMI) is  a measurement that can be used to identify possible weight problems. It estimates body fat based on height and weight. Your health care provider can help determine your BMI and help you achieve or maintain a healthy weight.  For females 50 years of age and older: ? A BMI below 18.5 is considered underweight. ? A BMI of 18.5 to 24.9 is normal. ? A BMI of 25 to 29.9 is considered overweight. ? A BMI of 30 and above is considered obese.  Watch levels of cholesterol and blood lipids  You should start having your blood tested for lipids and cholesterol at 70 years of age, then have this test every 5 years.  You may need to have your cholesterol levels checked more often if: ? Your lipid or cholesterol levels are high. ? You are older than 70 years of age. ? You are at high risk for heart disease.  Cancer screening Lung Cancer  Lung cancer screening is recommended for adults 65-73 years old who are at high risk for lung cancer because of a history of smoking.  A yearly low-dose CT scan of the lungs is recommended for people who: ? Currently smoke. ? Have quit within the past 15 years. ? Have at least a 30-pack-year history of smoking. A pack year is smoking an average of one pack of cigarettes a day for 1 year.  Yearly screening should continue until it has been 15 years since you quit.  Yearly screening should stop if you develop a health problem that would prevent you from having lung cancer treatment.  Breast Cancer  Practice breast self-awareness. This means understanding how your breasts normally appear and feel.  It also means doing regular breast self-exams. Let your health care provider know about any changes, no matter how small.  If you are in your 20s or 30s, you should have a clinical breast exam (CBE) by a health care provider every 1-3 years as part of a regular health exam.  If you are 84 or older, have a CBE every year. Also consider having a breast X-ray (mammogram)  every year.  If you have a family history of breast cancer, talk to your health care provider about genetic screening.  If you are at high risk for breast cancer, talk to your health care provider about having an MRI and a mammogram every year.  Breast cancer gene (BRCA) assessment is recommended for women who have family members with BRCA-related cancers. BRCA-related cancers include: ? Breast. ? Ovarian. ? Tubal. ? Peritoneal cancers.  Results of the assessment will determine the need for genetic counseling and BRCA1 and BRCA2 testing.  Cervical Cancer Your health care provider may recommend that you be screened regularly for cancer of the pelvic organs (ovaries, uterus, and vagina). This screening involves a pelvic examination, including checking for microscopic changes to the surface of your cervix (Pap test). You may be encouraged to have this screening done every 3 years, beginning at age 11.  For women ages 34-65, health care providers may recommend pelvic exams and Pap testing every 3 years, or they may recommend the Pap and pelvic exam, combined with testing for human papilloma virus (HPV), every 5 years. Some types of HPV increase your risk of cervical cancer. Testing for HPV may also be done on women of any age with unclear Pap test results.  Other health care providers may not recommend any screening for nonpregnant women who are considered low risk for pelvic cancer and who do not have symptoms. Ask your health care provider if a screening pelvic exam is right for you.  If you have had past treatment for cervical cancer or a condition that could lead to cancer, you need Pap tests and screening for cancer for at least 20 years after your treatment. If Pap tests have been discontinued, your risk factors (such as having a new sexual partner) need to be reassessed to determine if screening should resume. Some women have medical problems that increase the chance of getting cervical  cancer. In these cases, your health care provider may recommend more frequent screening and Pap tests.  Colorectal Cancer  This type of cancer can be detected and often prevented.  Routine colorectal cancer screening usually begins at 70 years of age and continues through 70 years of age.  Your health care provider may recommend screening at an earlier age if you have risk factors for colon cancer.  Your health care provider may also recommend using home test kits to check for hidden blood in the stool.  A small camera at the end of a tube can be used to examine your colon directly (sigmoidoscopy or colonoscopy). This is done to check for the earliest forms of colorectal cancer.  Routine screening usually begins at age 34.  Direct examination of the colon should be repeated every 5-10 years through 70 years of age. However, you may need to be screened more often if early forms of precancerous polyps or small growths are found.  Skin Cancer  Check your skin from head to toe regularly.  Tell  your health care provider about any new moles or changes in moles, especially if there is a change in a mole's shape or color.  Also tell your health care provider if you have a mole that is larger than the size of a pencil eraser.  Always use sunscreen. Apply sunscreen liberally and repeatedly throughout the day.  Protect yourself by wearing long sleeves, pants, a wide-brimmed hat, and sunglasses whenever you are outside.  Heart disease, diabetes, and high blood pressure  High blood pressure causes heart disease and increases the risk of stroke. High blood pressure is more likely to develop in: ? People who have blood pressure in the high end of the normal range (130-139/85-89 mm Hg). ? People who are overweight or obese. ? People who are African American.  If you are 36-65 years of age, have your blood pressure checked every 3-5 years. If you are 94 years of age or older, have your blood  pressure checked every year. You should have your blood pressure measured twice-once when you are at a hospital or clinic, and once when you are not at a hospital or clinic. Record the average of the two measurements. To check your blood pressure when you are not at a hospital or clinic, you can use: ? An automated blood pressure machine at a pharmacy. ? A home blood pressure monitor.  If you are between 19 years and 46 years old, ask your health care provider if you should take aspirin to prevent strokes.  Have regular diabetes screenings. This involves taking a blood sample to check your fasting blood sugar level. ? If you are at a normal weight and have a low risk for diabetes, have this test once every three years after 70 years of age. ? If you are overweight and have a high risk for diabetes, consider being tested at a younger age or more often. Preventing infection Hepatitis B  If you have a higher risk for hepatitis B, you should be screened for this virus. You are considered at high risk for hepatitis B if: ? You were born in a country where hepatitis B is common. Ask your health care provider which countries are considered high risk. ? Your parents were born in a high-risk country, and you have not been immunized against hepatitis B (hepatitis B vaccine). ? You have HIV or AIDS. ? You use needles to inject street drugs. ? You live with someone who has hepatitis B. ? You have had sex with someone who has hepatitis B. ? You get hemodialysis treatment. ? You take certain medicines for conditions, including cancer, organ transplantation, and autoimmune conditions.  Hepatitis C  Blood testing is recommended for: ? Everyone born from [birth year] through 1963. ? Anyone with known risk factors for hepatitis C.  Sexually transmitted infections (STIs)  You should be screened for sexually transmitted infections (STIs) including gonorrhea and chlamydia if: ? You are sexually active and are  younger than 70 years of age. ? You are older than 70 years of age and your health care provider tells you that you are at risk for this type of infection. ? Your sexual activity has changed since you were last screened and you are at an increased risk for chlamydia or gonorrhea. Ask your health care provider if you are at risk.  If you do not have HIV, but are at risk, it may be recommended that you take a prescription medicine daily to prevent HIV infection. This is called pre-exposure  prophylaxis (PrEP). You are considered at risk if: ? You are sexually active and do not regularly use condoms or know the HIV status of your partner(s). ? You take drugs by injection. ? You are sexually active with a partner who has HIV.  Talk with your health care provider about whether you are at high risk of being infected with HIV. If you choose to begin PrEP, you should first be tested for HIV. You should then be tested every 3 months for as long as you are taking PrEP. Pregnancy  If you are premenopausal and you may become pregnant, ask your health care provider about preconception counseling.  If you may become pregnant, take 400 to 800 micrograms (mcg) of folic acid every day.  If you want to prevent pregnancy, talk to your health care provider about birth control (contraception). Osteoporosis and menopause  Osteoporosis is a disease in which the bones lose minerals and strength with aging. This can result in serious bone fractures. Your risk for osteoporosis can be identified using a bone density scan.  If you are 75 years of age or older, or if you are at risk for osteoporosis and fractures, ask your health care provider if you should be screened.  Ask your health care provider whether you should take a calcium or vitamin D supplement to lower your risk for osteoporosis.  Menopause may have certain physical symptoms and risks.  Hormone replacement therapy may reduce some of these symptoms and  risks. Talk to your health care provider about whether hormone replacement therapy is right for you. Follow these instructions at home:  Schedule regular health, dental, and eye exams.  Stay current with your immunizations.  Do not use any tobacco products including cigarettes, chewing tobacco, or electronic cigarettes.  If you are pregnant, do not drink alcohol.  If you are breastfeeding, limit how much and how often you drink alcohol.  Limit alcohol intake to no more than 1 drink per day for nonpregnant women. One drink equals 12 ounces of beer, 5 ounces of wine, or 1 ounces of hard liquor.  Do not use street drugs.  Do not share needles.  Ask your health care provider for help if you need support or information about quitting drugs.  Tell your health care provider if you often feel depressed.  Tell your health care provider if you have ever been abused or do not feel safe at home. This information is not intended to replace advice given to you by your health care provider. Make sure you discuss any questions you have with your health care provider. Document Released: 03/28/2011 Document Revised: 02/18/2016 Document Reviewed: 06/16/2015 Elsevier Interactive Patient Education  [employer].

## 2018-06-27 ENCOUNTER — Encounter: Payer: Self-pay | Admitting: Internal Medicine

## 2018-06-27 ENCOUNTER — Other Ambulatory Visit (INDEPENDENT_AMBULATORY_CARE_PROVIDER_SITE_OTHER): Payer: PPO

## 2018-06-27 ENCOUNTER — Ambulatory Visit (INDEPENDENT_AMBULATORY_CARE_PROVIDER_SITE_OTHER): Payer: PPO | Admitting: Internal Medicine

## 2018-06-27 VITALS — BP 142/82 | HR 61 | Temp 98.5°F | Resp 16 | Ht 67.0 in | Wt 229.8 lb

## 2018-06-27 DIAGNOSIS — Z13820 Encounter for screening for osteoporosis: Secondary | ICD-10-CM

## 2018-06-27 DIAGNOSIS — E2839 Other primary ovarian failure: Secondary | ICD-10-CM

## 2018-06-27 DIAGNOSIS — Z0000 Encounter for general adult medical examination without abnormal findings: Secondary | ICD-10-CM

## 2018-06-27 DIAGNOSIS — R7303 Prediabetes: Secondary | ICD-10-CM

## 2018-06-27 DIAGNOSIS — R030 Elevated blood-pressure reading, without diagnosis of hypertension: Secondary | ICD-10-CM

## 2018-06-27 DIAGNOSIS — F419 Anxiety disorder, unspecified: Secondary | ICD-10-CM

## 2018-06-27 DIAGNOSIS — E039 Hypothyroidism, unspecified: Secondary | ICD-10-CM | POA: Diagnosis not present

## 2018-06-27 DIAGNOSIS — F329 Major depressive disorder, single episode, unspecified: Secondary | ICD-10-CM | POA: Diagnosis not present

## 2018-06-27 LAB — COMPREHENSIVE METABOLIC PANEL
ALT: 14 U/L (ref 0–35)
AST: 16 U/L (ref 0–37)
Albumin: 3.9 g/dL (ref 3.5–5.2)
Alkaline Phosphatase: 55 U/L (ref 39–117)
BUN: 22 mg/dL (ref 6–23)
CO2: 29 mEq/L (ref 19–32)
Calcium: 8.8 mg/dL (ref 8.4–10.5)
Chloride: 102 mEq/L (ref 96–112)
Creatinine, Ser: 1.14 mg/dL (ref 0.40–1.20)
GFR: 49.98 mL/min — AB (ref >60.00)
GLUCOSE: 108 mg/dL — AB (ref 70–99)
POTASSIUM: 4.4 meq/L (ref 3.5–5.1)
Sodium: 139 mEq/L (ref 135–145)
TOTAL PROTEIN: 7 g/dL (ref 6.0–8.3)
Total Bilirubin: 0.4 mg/dL (ref 0.2–1.2)

## 2018-06-27 LAB — LIPID PANEL
CHOLESTEROL: 201 mg/dL — AB (ref 0–200)
HDL: 63.4 mg/dL (ref >39.00)
LDL CALC: 114 mg/dL — AB (ref 0–99)
NONHDL: 137.48
Total CHOL/HDL Ratio: 3
Triglycerides: 117 mg/dL (ref 0.0–149.0)
VLDL: 23.4 mg/dL (ref 0.0–40.0)

## 2018-06-27 LAB — CBC WITH DIFFERENTIAL/PLATELET
Basophils Absolute: 0.1 10*3/uL (ref 0.0–0.1)
Basophils Relative: 0.9 % (ref 0.0–3.0)
EOS PCT: 1.1 % (ref 0.0–5.0)
Eosinophils Absolute: 0.1 10*3/uL (ref 0.0–0.7)
HEMATOCRIT: 37.8 % (ref 36.0–46.0)
HEMOGLOBIN: 12.8 g/dL (ref 12.0–15.0)
LYMPHS ABS: 2.1 10*3/uL (ref 0.7–4.0)
LYMPHS PCT: 32.1 % (ref 12.0–46.0)
MCHC: 33.8 g/dL (ref 30.0–36.0)
MCV: 89.1 fl (ref 78.0–100.0)
MONOS PCT: 4.9 % (ref 3.0–12.0)
Monocytes Absolute: 0.3 10*3/uL (ref 0.1–1.0)
Neutro Abs: 3.9 10*3/uL (ref 1.4–7.7)
Neutrophils Relative %: 61 % (ref 43.0–77.0)
Platelets: 167 10*3/uL (ref 150.0–400.0)
RBC: 4.25 Mil/uL (ref 3.87–5.11)
RDW: 13.1 % (ref 11.5–15.5)
WBC: 6.4 10*3/uL (ref 4.0–10.5)

## 2018-06-27 LAB — HEMOGLOBIN A1C: Hgb A1c MFr Bld: 5.3 % (ref 4.6–6.5)

## 2018-06-27 LAB — TSH: TSH: 2.72 u[IU]/mL (ref 0.35–4.50)

## 2018-06-27 MED ORDER — LEVOTHYROXINE SODIUM 25 MCG PO TABS: 25.0000 ug | ORAL_TABLET | Freq: Every day | ORAL | 1 refills | Status: DC

## 2018-06-27 MED ORDER — ESCITALOPRAM OXALATE 10 MG PO TABS: 10.0000 mg | ORAL_TABLET | Freq: Every day | ORAL | 1 refills | Status: DC

## 2018-06-27 NOTE — Assessment & Plan Note (Signed)
Having increased anxiety since she starting dating someone No side effects with lexapro in the past Restart lexapro 10 mg daily

## 2018-06-27 NOTE — Assessment & Plan Note (Signed)
Clinically euthyroid Check tsh  Titrate med dose if needed  

## 2018-06-27 NOTE — Assessment & Plan Note (Signed)
Check a1c Low sugar / carb diet Stressed regular exercise, weight loss  

## 2018-06-27 NOTE — Assessment & Plan Note (Signed)
BP mildly elevated Advised her to start checking it Start exercising Work on weight loss Low sodium diet cmp

## 2018-06-28 ENCOUNTER — Encounter: Payer: Self-pay | Admitting: Internal Medicine

## 2018-07-06 ENCOUNTER — Telehealth: Payer: Self-pay | Admitting: Emergency Medicine

## 2018-07-06 NOTE — Telephone Encounter (Signed)
Spoke with pt to give lab results. Mailed copy to pt at request.

## 2018-07-06 NOTE — Telephone Encounter (Signed)
LVM informing pt of results. Asked that pt call back if she would like to be deactivated from [place].

## 2018-07-06 NOTE — Telephone Encounter (Signed)
Copied from [place] [id number]. Topic: Quick Communication - Lab Results (Clinic Use ONLY) >> Jul 06, 2018 10:47 AM Lennox Solders wrote: Pt is calling and would like blood work results. Pt does not use mychart

## 2018-07-25 ENCOUNTER — Encounter: Payer: Self-pay | Admitting: Internal Medicine

## 2018-07-25 DIAGNOSIS — E349 Endocrine disorder, unspecified: Secondary | ICD-10-CM | POA: Diagnosis not present

## 2018-07-25 DIAGNOSIS — Z780 Asymptomatic menopausal state: Secondary | ICD-10-CM | POA: Diagnosis not present

## 2018-07-25 DIAGNOSIS — Z1231 Encounter for screening mammogram for malignant neoplasm of breast: Secondary | ICD-10-CM | POA: Diagnosis not present

## 2018-07-25 LAB — HM MAMMOGRAPHY

## 2018-07-28 ENCOUNTER — Encounter: Payer: Self-pay | Admitting: Internal Medicine

## 2018-07-31 ENCOUNTER — Encounter: Payer: Self-pay | Admitting: Internal Medicine

## 2018-08-23 IMAGING — DX DG THORACIC SPINE 2V
3 series · 3 of 3 positions shown · non-contrast
Comparison: Chest radiograph November 20, 2008

CLINICAL DATA: Dorsalgia for 1 month

EXAM:
THORACIC SPINE 3 VIEWS

[t-spine ap]
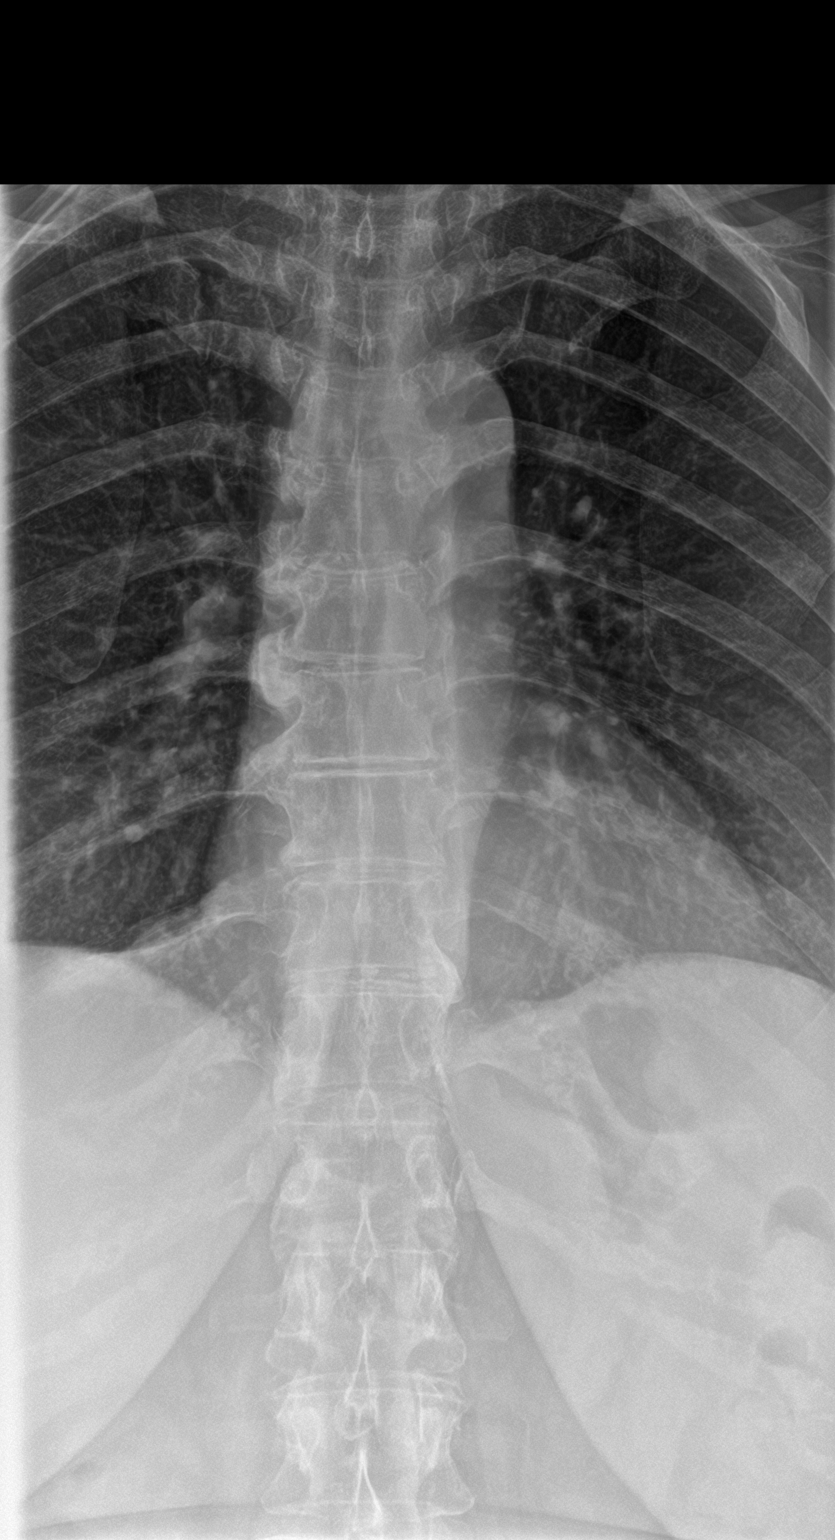

[t-spine lat]
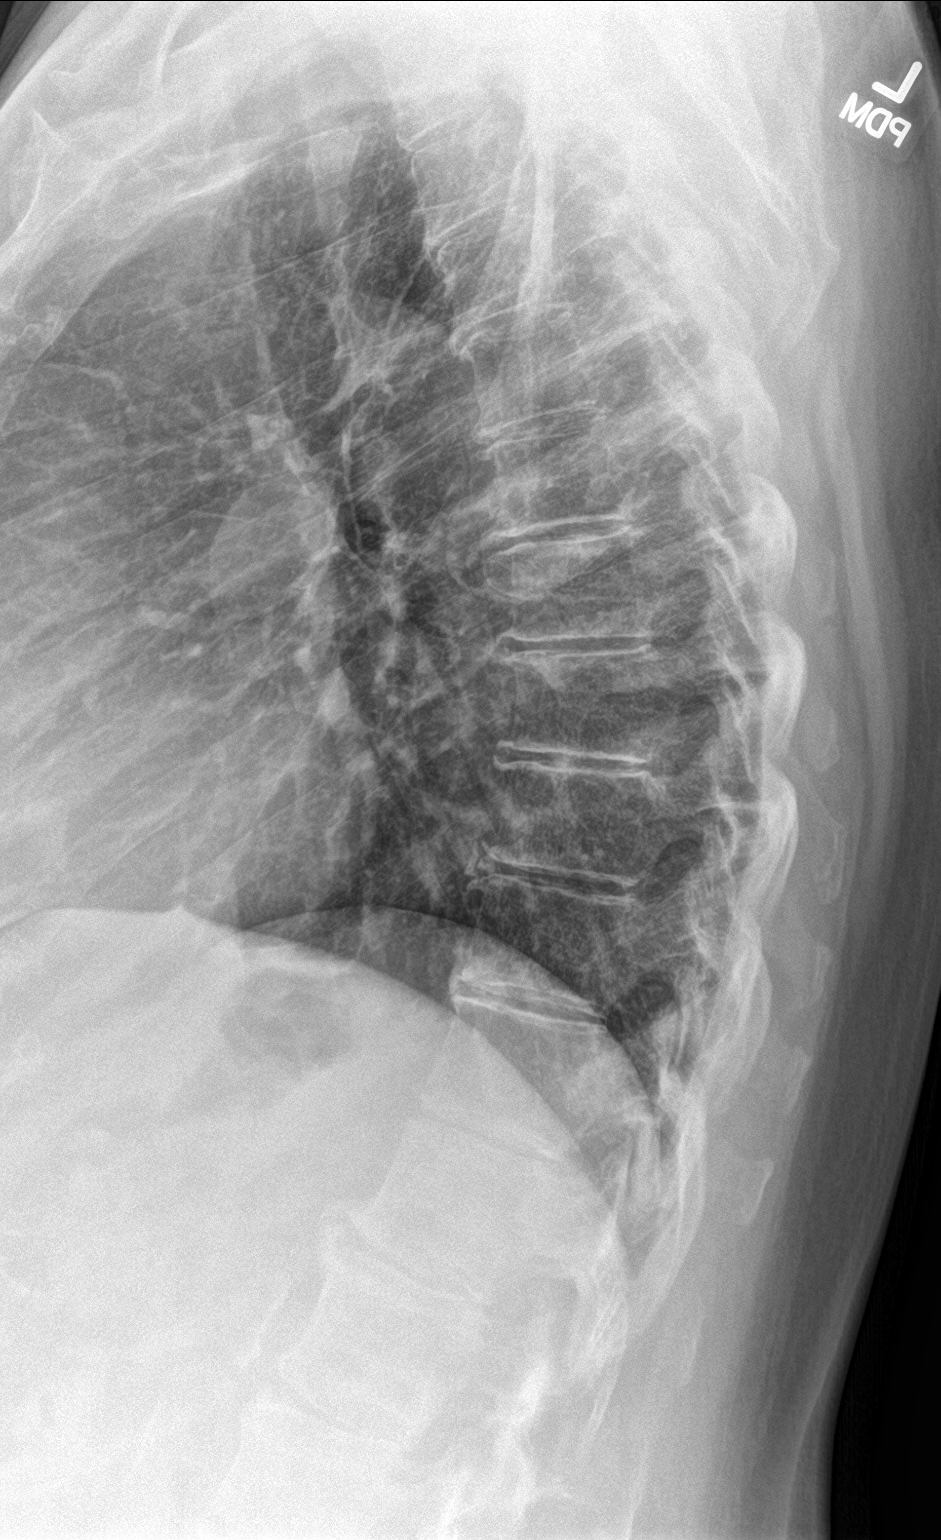

[swimmer]
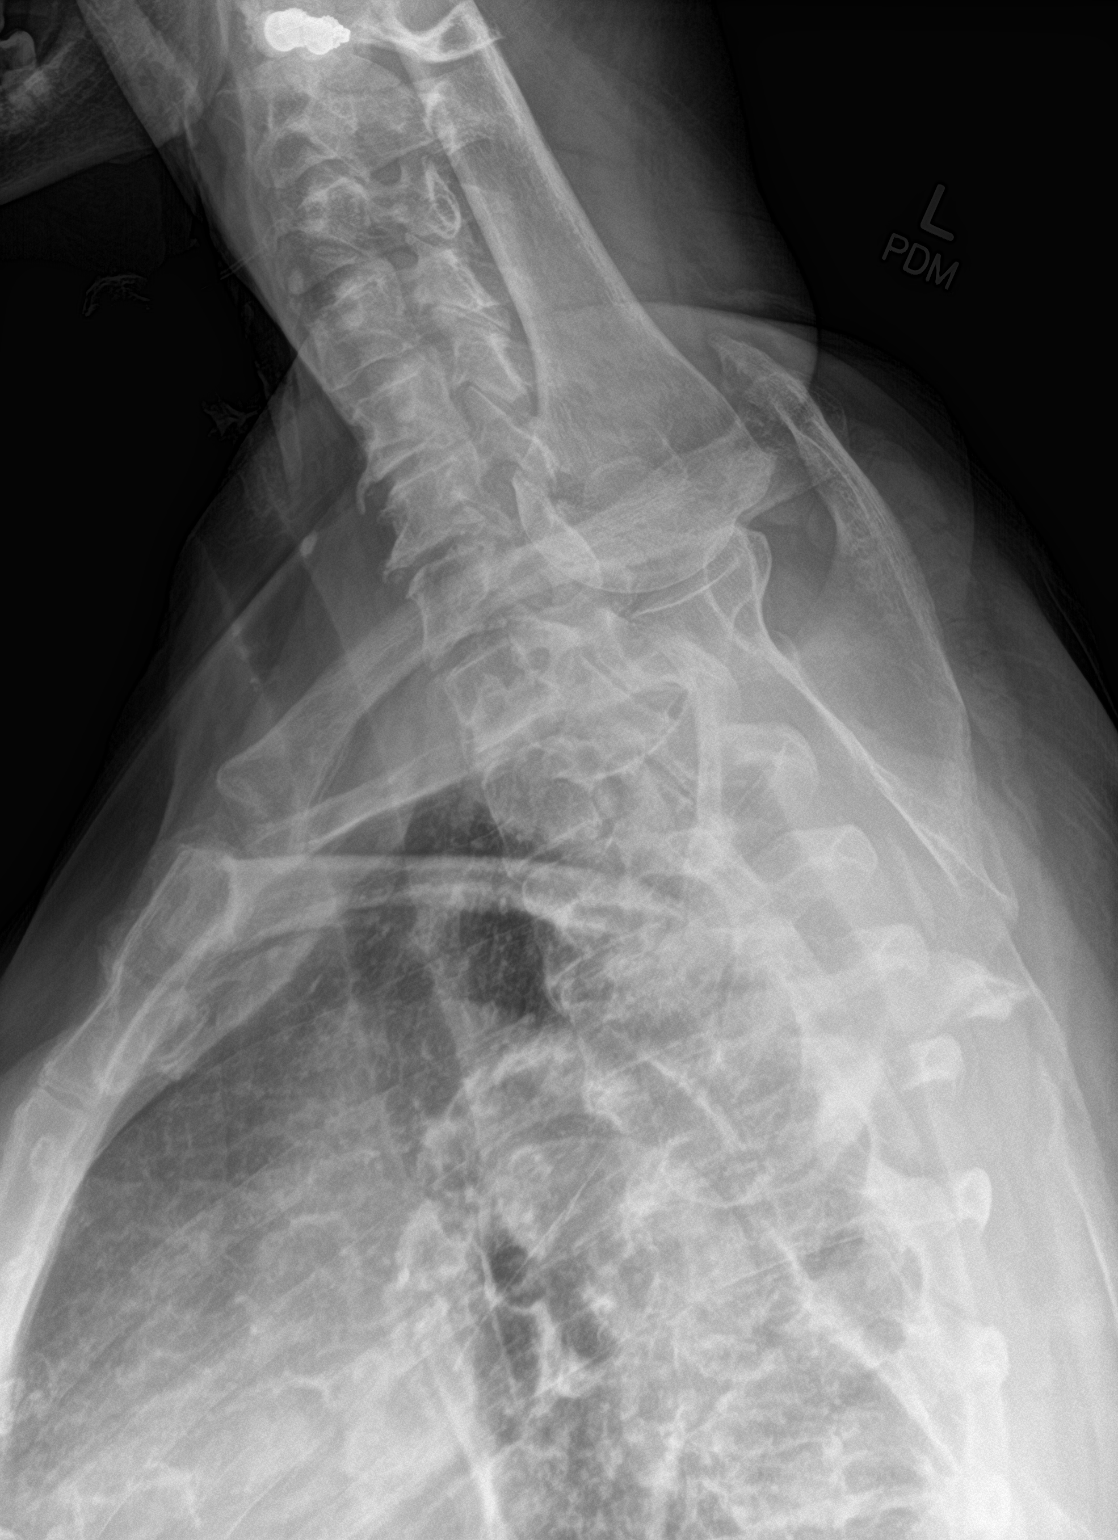

[3 of 3 positions shown; findings below may reference images not displayed]

FINDINGS: Frontal, lateral, and swimmer's views were obtained. There is no
fracture or spondylolisthesis. There is disc space narrowing at
multiple levels. There are multiple anterior and lateral
osteophytes. No erosive change or paraspinous lesion. There is
atherosclerotic calcification in the abdominal aorta.
IMPRESSION: Osteoarthritic change at multiple levels. No fracture or
spondylolisthesis. Evidence of aortic atherosclerosis.

## 2018-09-22 IMAGING — DX DG CHEST 2V
2 series · 2 of 2 positions shown · non-contrast
Comparison: 08/10/2016 thoracic spine radiographs, chest radiograph
11/20/2008

CLINICAL DATA: Intermittent mid thoracic back pain for 4-5 months,
no known injury

EXAM:
CHEST  2 VIEW

[chest pa]
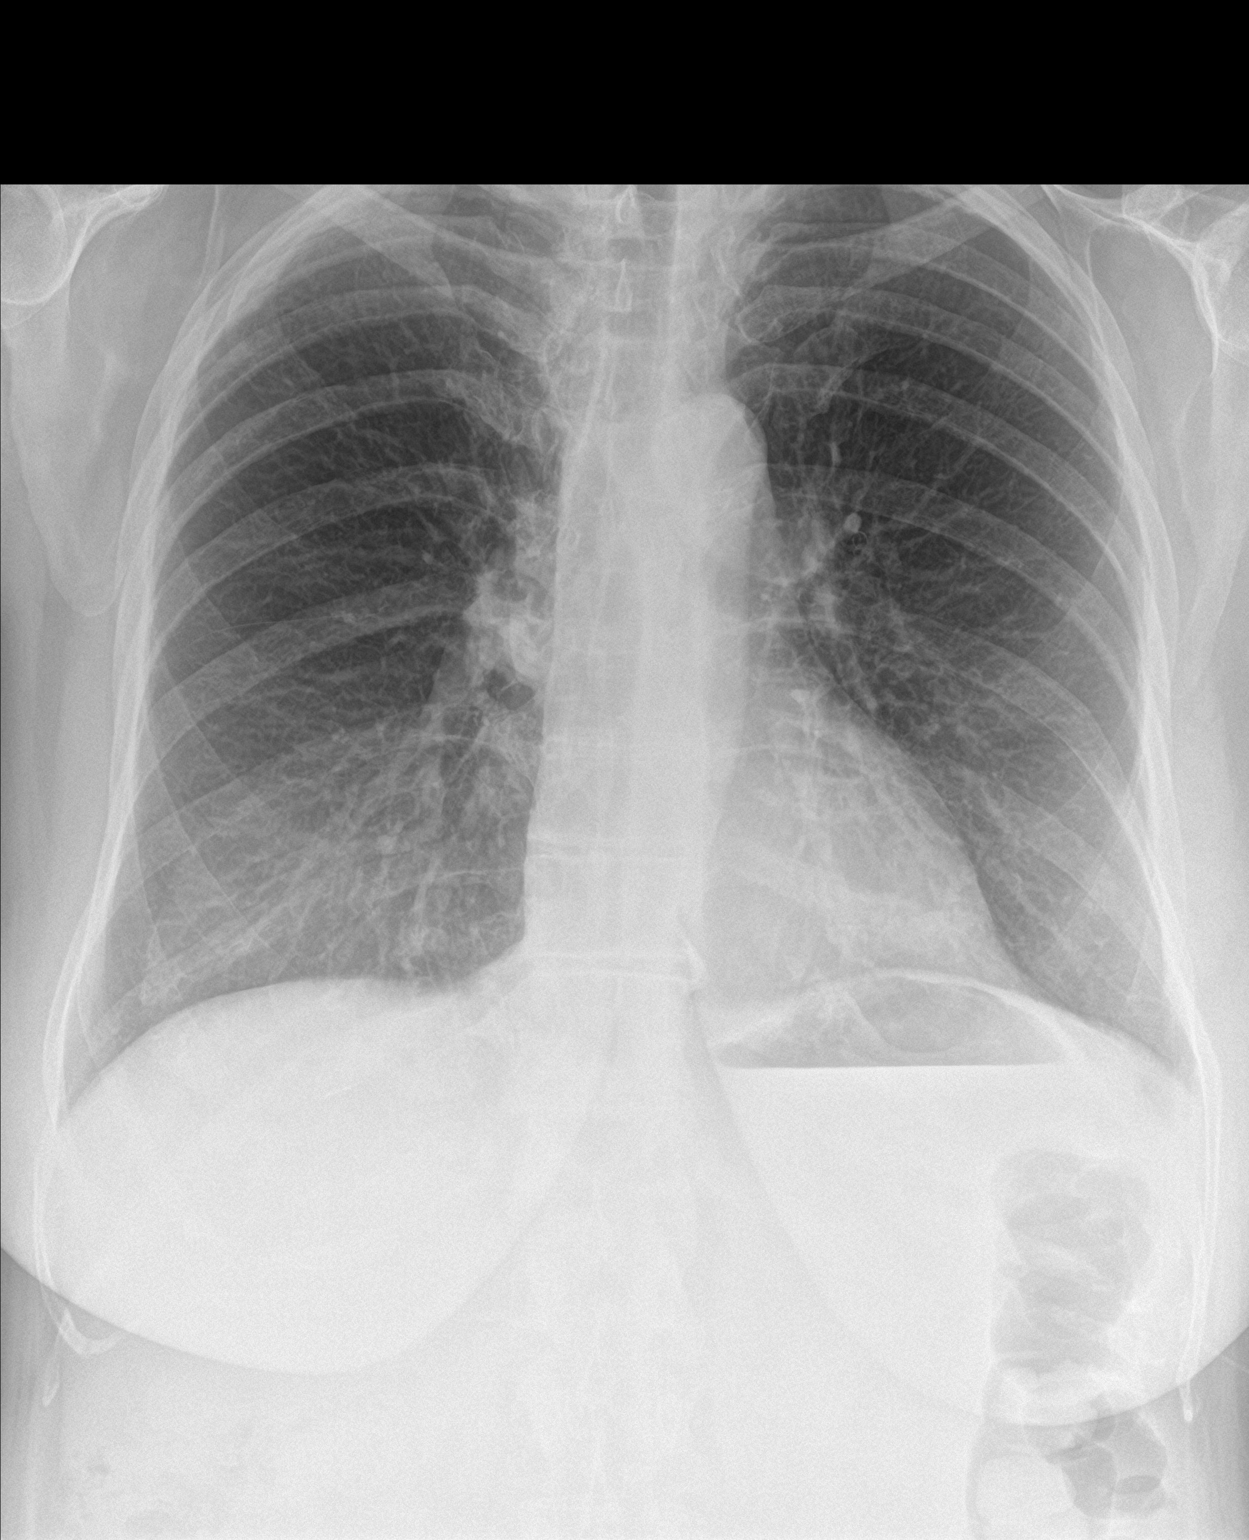

[chest lat]
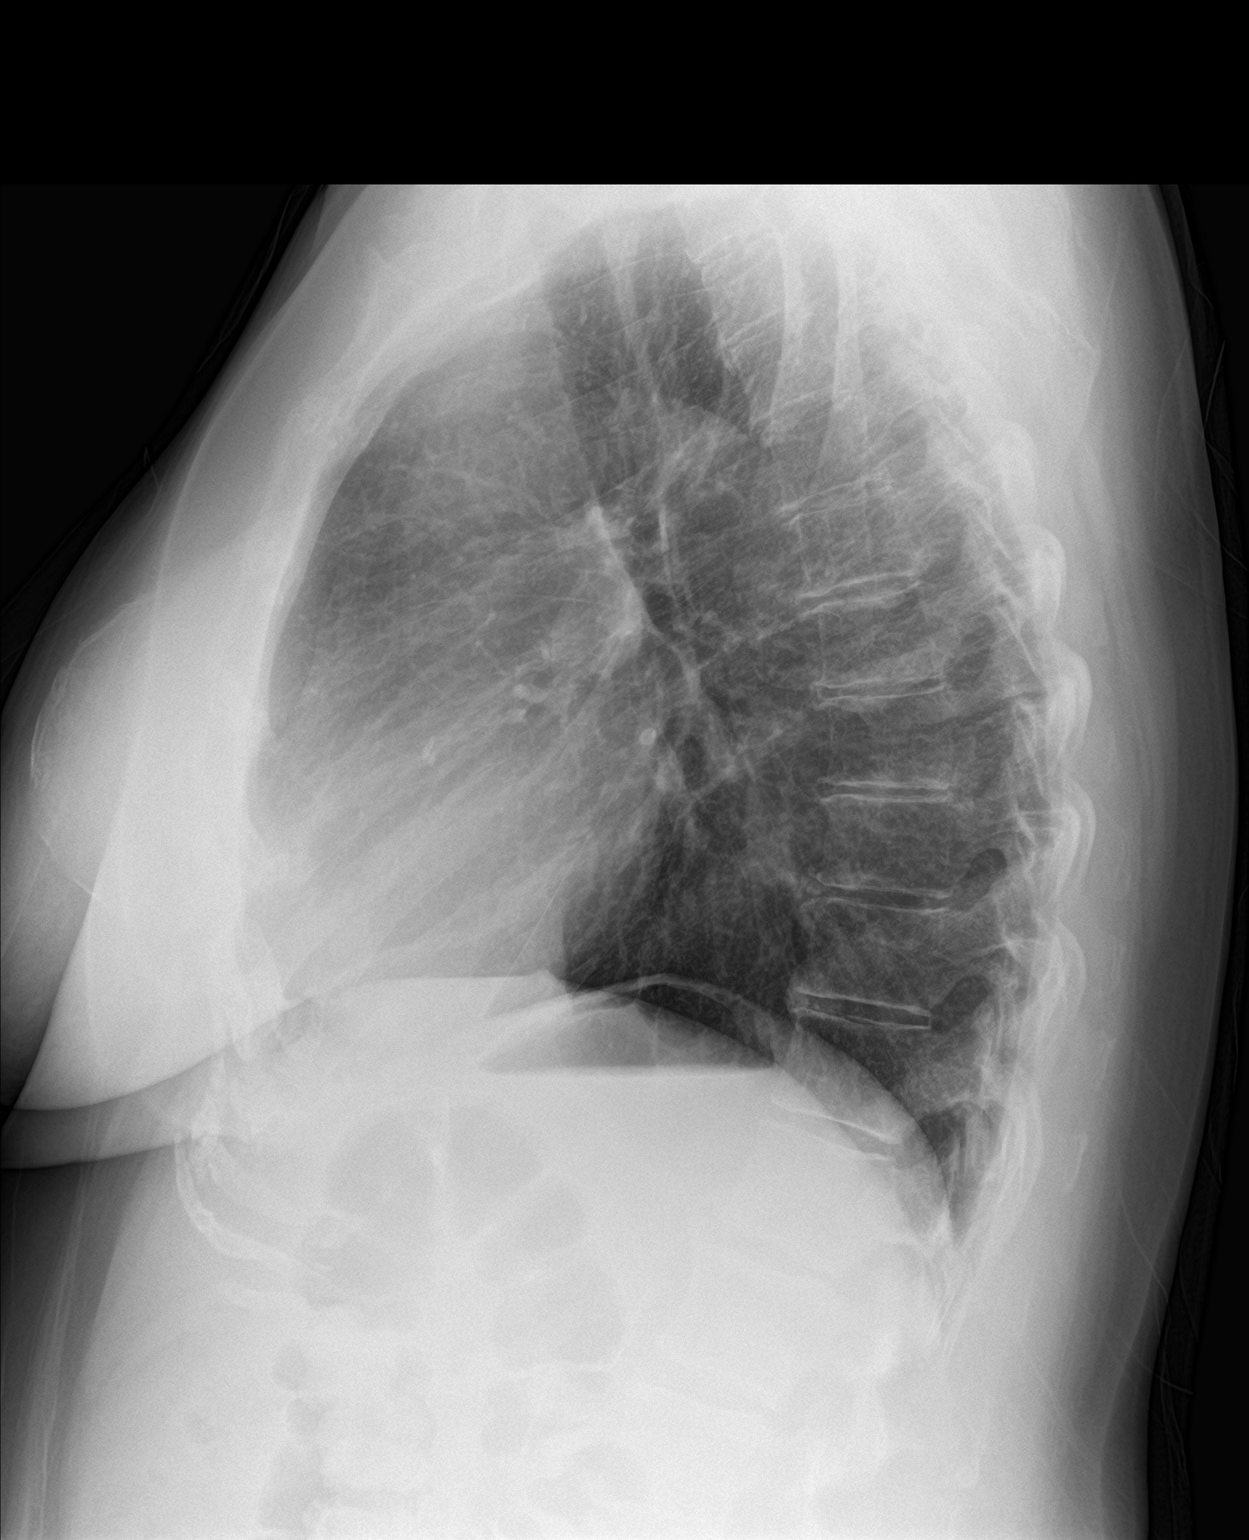

[2 of 2 positions shown; findings below may reference images not displayed]

FINDINGS: Normal heart size, mediastinal contours, and pulmonary vascularity.

Lungs clear.

No pleural effusion or pneumothorax.

Bones demineralized.

Fracture bone destruction.

RIGHT breast prosthesis noted.
IMPRESSION: No acute abnormalities.

Osseous mineralization.

## 2019-02-13 ENCOUNTER — Encounter: Payer: Self-pay | Admitting: Internal Medicine

## 2019-03-18 ENCOUNTER — Other Ambulatory Visit: Payer: Self-pay | Admitting: Internal Medicine

## 2019-05-06 ENCOUNTER — Encounter: Payer: Self-pay | Admitting: Internal Medicine

## 2019-05-23 ENCOUNTER — Ambulatory Visit (AMBULATORY_SURGERY_CENTER): Payer: Self-pay | Admitting: *Deleted

## 2019-05-23 ENCOUNTER — Other Ambulatory Visit: Payer: Self-pay

## 2019-05-23 VITALS — Temp 96.8°F | Ht 68.0 in | Wt 239.0 lb

## 2019-05-23 DIAGNOSIS — Z1210 Encounter for screening for malignant neoplasm of intestinal tract, unspecified: Secondary | ICD-10-CM

## 2019-05-23 NOTE — Progress Notes (Signed)
No egg or soy allergy known to patient  No issues with past sedation with any surgeries  or procedures, no intubation problems  No diet pills per patient No home 02 use per patient  No blood thinners per patient  Pt denies issues with constipation  No A fib or A flutter  EMMI video sent to pt's e mail  

## 2019-05-27 ENCOUNTER — Encounter: Payer: Self-pay | Admitting: Internal Medicine

## 2019-05-29 ENCOUNTER — Other Ambulatory Visit: Payer: Self-pay | Admitting: Internal Medicine

## 2019-06-05 ENCOUNTER — Telehealth: Payer: Self-pay

## 2019-06-05 NOTE — Telephone Encounter (Signed)
Covid-19 screening questions   Do you now or have you had a fever in the last 14 days? NO   Do you have any respiratory symptoms of shortness of breath or cough now or in the last 14 days? NO  Do you have any family members or close contacts with diagnosed or suspected Covid-19 in the past 14 days? NO  Have you been tested for Covid-19 and found to be positive? NO        

## 2019-06-06 ENCOUNTER — Encounter: Payer: Self-pay | Admitting: Internal Medicine

## 2019-06-06 ENCOUNTER — Other Ambulatory Visit: Payer: Self-pay

## 2019-06-06 ENCOUNTER — Ambulatory Visit (AMBULATORY_SURGERY_CENTER): Payer: PPO | Admitting: Internal Medicine

## 2019-06-06 VITALS — BP 120/72 | HR 58 | Temp 98.5°F | Resp 23 | Ht 68.0 in | Wt 239.0 lb

## 2019-06-06 DIAGNOSIS — Z1211 Encounter for screening for malignant neoplasm of colon: Secondary | ICD-10-CM

## 2019-06-06 DIAGNOSIS — D128 Benign neoplasm of rectum: Secondary | ICD-10-CM

## 2019-06-06 DIAGNOSIS — K621 Rectal polyp: Secondary | ICD-10-CM | POA: Diagnosis not present

## 2019-06-06 DIAGNOSIS — Z1210 Encounter for screening for malignant neoplasm of intestinal tract, unspecified: Secondary | ICD-10-CM

## 2019-06-06 DIAGNOSIS — D122 Benign neoplasm of ascending colon: Secondary | ICD-10-CM

## 2019-06-06 MED ORDER — SODIUM CHLORIDE 0.9 % IV SOLN: 500.0000 mL | Freq: Once | INTRAVENOUS | Status: DC

## 2019-06-06 NOTE — Op Note (Signed)
[name]: [name] Procedure Date: 06/06/2019 9:00 AM MRN: [id number] Endoscopist: Gatha Mayer , MD Age: 73 Referring MD:  Date of Birth: 05/26/1946 Gender: Female Account #: [id number] Procedure:                Colonoscopy Indications:              Screening for colorectal malignant neoplasm, Last                            colonoscopy: 2010 Medicines:                Propofol per Anesthesia, Monitored Anesthesia Care Procedure:                Pre-Anesthesia Assessment:                           - Prior to the procedure, a History and Physical                            was performed, and patient medications and                            allergies were reviewed. The patient's tolerance of                            previous anesthesia was also reviewed. The risks                            and benefits of the procedure and the sedation                            options and risks were discussed with the patient.                            All questions were answered, and informed consent                            was obtained. Prior Anticoagulants: The patient has                            taken no previous anticoagulant or antiplatelet                            agents. ASA Grade Assessment: II - A patient with                            mild systemic disease. After reviewing the risks                            and benefits, the patient was deemed in                            satisfactory condition to undergo the procedure.  After obtaining informed consent, the colonoscope                            was passed under direct vision. Throughout the                            procedure, the patient's blood pressure, pulse, and                            oxygen saturations were monitored continuously. The                            Colonoscope was introduced through the anus and                            advanced to the the  cecum, identified by                            appendiceal orifice and ileocecal valve. The                            colonoscopy was performed without difficulty. The                            patient tolerated the procedure well. The quality                            of the bowel preparation was excellent. The bowel                            preparation used was Miralax via split dose                            instruction. The ileocecal valve, appendiceal                            orifice, and rectum were photographed. Scope In: 9:07:41 AM Scope Out: 9:24:26 AM Scope Withdrawal Time: 0 hours 13 minutes 28 seconds  Total Procedure Duration: 0 hours 16 minutes 45 seconds  Findings:                 The perianal and digital rectal examinations were                            normal.                           Two sessile polyps were found in the rectum and                            ascending colon. The polyps were diminutive in                            size. These polyps were removed with a cold snare.  Resection and retrieval were complete. Verification                            of patient identification for the specimen was                            done. Estimated blood loss was minimal.                           Multiple diverticula were found in the sigmoid                            colon.                           The exam was otherwise without abnormality on                            direct and retroflexion views. Complications:            No immediate complications. Estimated Blood Loss:     Estimated blood loss was minimal. Impression:               - Two diminutive polyps in the rectum and in the                            ascending colon, removed with a cold snare.                            Resected and retrieved.                           - Diverticulosis in the sigmoid colon.                           - The examination was otherwise  normal on direct                            and retroflexion views. Recommendation:           - Patient has a contact number available for                            emergencies. The signs and symptoms of potential                            delayed complications were discussed with the                            patient. Return to normal activities tomorrow.                            Written discharge instructions were provided to the                            patient.                           -  Resume previous diet.                           - Continue present medications.                           - Repeat colonoscopy may be recommended. The                            colonoscopy date will be determined after pathology                            results from today's exam become available for                            review. Gatha Mayer, MD 06/06/2019 9:29:40 AM This report has been signed electronically.

## 2019-06-06 NOTE — Progress Notes (Signed)
A and O x3. Report to RN. Tolerated MAC anesthesia well.

## 2019-06-06 NOTE — Progress Notes (Signed)
Called to room to assist during endoscopic procedure.  Patient ID and intended procedure confirmed with present staff. Received instructions for my participation in the procedure from the performing physician.  

## 2019-06-06 NOTE — Patient Instructions (Addendum)
I found and removed 2 tiny polyps. No signs of cancer.  You do have diverticulosis - thickened muscle rings and pouches in the colon wall. Please read the handout about this condition.  I will let you know pathology results and when to have another routine colonoscopy by mail and/or My Chart.  I appreciate the opportunity to care for you. Gatha Mayer, MD, FACG YOU HAD AN ENDOSCOPIC PROCEDURE TODAY AT [facility]:   Refer to the procedure report that was given to you for any specific questions about what was found during the examination.  If the procedure report does not answer your questions, please call your gastroenterologist to clarify.  If you requested that your care partner not be given the details of your procedure findings, then the procedure report has been included in a sealed envelope for you to review at your convenience later.  YOU SHOULD EXPECT: Some feelings of bloating in the abdomen. Passage of more gas than usual.  Walking can help get rid of the air that was put into your GI tract during the procedure and reduce the bloating. If you had a lower endoscopy (such as a colonoscopy or flexible sigmoidoscopy) you may notice spotting of blood in your stool or on the toilet paper. If you underwent a bowel prep for your procedure, you may not have a normal bowel movement for a few days.  Please Note:  You might notice some irritation and congestion in your nose or some drainage.  This is from the oxygen used during your procedure.  There is no need for concern and it should clear up in a day or so.  SYMPTOMS TO REPORT IMMEDIATELY:   Following lower endoscopy (colonoscopy or flexible sigmoidoscopy):  Excessive amounts of blood in the stool  Significant tenderness or worsening of abdominal pains  Swelling of the abdomen that is new, acute  Fever of 100F or higher   For urgent or emergent issues, a gastroenterologist can be reached at any hour by calling [phone]  [phone].   DIET:  We do recommend a small meal at first, but then you may proceed to your regular diet.  Drink plenty of fluids but you should avoid alcoholic beverages for 24 hours.  MEDICATIONS: Continue present medications.  Please see handouts given to you by your recovery nurse.  ACTIVITY:  You should plan to take it easy for the rest of today and you should NOT DRIVE or use heavy machinery until tomorrow (because of the sedation medicines used during the test).    FOLLOW UP: Our staff will call the number listed on your records 48-72 hours following your procedure to check on you and address any questions or concerns that you may have regarding the information given to you following your procedure. If we do not reach you, we will leave a message.  We will attempt to reach you two times.  During this call, we will ask if you have developed any symptoms of COVID 19. If you develop any symptoms (ie: fever, flu-like symptoms, shortness of breath, cough etc.) before then, please call [phone].  If you test positive for Covid 19 in the 2 weeks post procedure, please call and report this information to [place].    If any biopsies were taken you will be contacted by phone or by letter within the next 1-3 weeks.  Please call us at [phone] if you have not heard about the biopsies in 3 weeks.   Thank you  for allowing [place] to provide for your healthcare needs today.   SIGNATURES/CONFIDENTIALITY: You and/or your care partner have signed paperwork which will be entered into your electronic medical record.  These signatures attest to the fact that that the information above on your After Visit Summary has been reviewed and is understood.  Full responsibility of the confidentiality of this discharge information lies with you and/or your care-partner.

## 2019-06-06 NOTE — Progress Notes (Signed)
Pt's states no medical or surgical changes since previsit or office visit. 

## 2019-06-10 ENCOUNTER — Telehealth: Payer: Self-pay

## 2019-06-10 NOTE — Telephone Encounter (Signed)
  Follow up Call-  Call back number 06/06/2019  Post procedure Call Back phone  # [phone]  Permission to leave phone message Yes  Some recent data might be hidden     Patient questions:  Do you have a fever, pain , or abdominal swelling? No. Pain Score  0 *  Have you tolerated food without any problems? Yes.    Have you been able to return to your normal activities? Yes.    Do you have any questions about your discharge instructions: Diet   No. Medications  No. Follow up visit  No.  Do you have questions or concerns about your Care? No.  Actions: * If pain score is 4 or above: No action needed, pain <4. 1. Have you developed a fever since your procedure? no  2.   Have you had an respiratory symptoms (SOB or cough) since your procedure? no  3.   Have you tested positive for COVID 19 since your procedure no  4.   Have you had any family members/close contacts diagnosed with the COVID 19 since your procedure?  no   If yes to any of these questions please route to Joylene John, RN and Alphonsa Gin, Therapist, sports.

## 2019-06-14 ENCOUNTER — Encounter: Payer: Self-pay | Admitting: Internal Medicine

## 2019-06-14 NOTE — Progress Notes (Signed)
Diminutive adenoma + rectal hyperplastic No recall age + findings My Chart letter

## 2019-06-17 ENCOUNTER — Telehealth: Payer: Self-pay

## 2019-06-17 DIAGNOSIS — Z0000 Encounter for general adult medical examination without abnormal findings: Secondary | ICD-10-CM

## 2019-06-17 DIAGNOSIS — E039 Hypothyroidism, unspecified: Secondary | ICD-10-CM

## 2019-06-17 DIAGNOSIS — R7303 Prediabetes: Secondary | ICD-10-CM

## 2019-06-17 NOTE — Telephone Encounter (Signed)
Appointment on 10/5

## 2019-06-17 NOTE — Telephone Encounter (Signed)
Copied from [place] [phone]. Topic: General - Other >> Jun 17, 2019 10:02 AM Keene Breath wrote: Reason for CRM: Patient called to ask if she could get her labs done prior to her upcoming physical appt. So the doctor can go over the results at her appt.  Please call patient to let her know if this is possible.  CB# [phone]

## 2019-06-18 NOTE — Telephone Encounter (Signed)
Blood work ordered-can get done a few days before the visit

## 2019-06-18 NOTE — Telephone Encounter (Signed)
Pt aware and expressed understanding.  

## 2019-06-26 ENCOUNTER — Other Ambulatory Visit (INDEPENDENT_AMBULATORY_CARE_PROVIDER_SITE_OTHER): Payer: PPO

## 2019-06-26 DIAGNOSIS — E039 Hypothyroidism, unspecified: Secondary | ICD-10-CM | POA: Diagnosis not present

## 2019-06-26 DIAGNOSIS — Z0000 Encounter for general adult medical examination without abnormal findings: Secondary | ICD-10-CM

## 2019-06-26 DIAGNOSIS — R7303 Prediabetes: Secondary | ICD-10-CM

## 2019-06-26 LAB — CBC WITH DIFFERENTIAL/PLATELET
Basophils Absolute: 0.1 10*3/uL (ref 0.0–0.1)
Basophils Relative: 1.1 % (ref 0.0–3.0)
Eosinophils Absolute: 0.1 10*3/uL (ref 0.0–0.7)
Eosinophils Relative: 1.8 % (ref 0.0–5.0)
HCT: 39.4 % (ref 36.0–46.0)
Hemoglobin: 13.3 g/dL (ref 12.0–15.0)
Lymphocytes Relative: 36.4 % (ref 12.0–46.0)
Lymphs Abs: 2.2 10*3/uL (ref 0.7–4.0)
MCHC: 33.7 g/dL (ref 30.0–36.0)
MCV: 89.8 fl (ref 78.0–100.0)
Monocytes Absolute: 0.4 10*3/uL (ref 0.1–1.0)
Monocytes Relative: 7.2 % (ref 3.0–12.0)
Neutro Abs: 3.2 10*3/uL (ref 1.4–7.7)
Neutrophils Relative %: 53.5 % (ref 43.0–77.0)
Platelets: 174 10*3/uL (ref 150.0–400.0)
RBC: 4.39 Mil/uL (ref 3.87–5.11)
RDW: 13 % (ref 11.5–15.5)
WBC: 6 10*3/uL (ref 4.0–10.5)

## 2019-06-26 LAB — COMPREHENSIVE METABOLIC PANEL
ALT: 14 U/L (ref 0–35)
AST: 14 U/L (ref 0–37)
Albumin: 3.9 g/dL (ref 3.5–5.2)
Alkaline Phosphatase: 66 U/L (ref 39–117)
BUN: 17 mg/dL (ref 6–23)
CO2: 28 mEq/L (ref 19–32)
Calcium: 9.4 mg/dL (ref 8.4–10.5)
Chloride: 105 mEq/L (ref 96–112)
Creatinine, Ser: 0.93 mg/dL (ref 0.40–1.20)
GFR: 59.31 mL/min — ABNORMAL LOW (ref >60.00)
Glucose, Bld: 93 mg/dL (ref 70–99)
Potassium: 4.8 mEq/L (ref 3.5–5.1)
Sodium: 140 mEq/L (ref 135–145)
Total Bilirubin: 0.4 mg/dL (ref 0.2–1.2)
Total Protein: 7.2 g/dL (ref 6.0–8.3)

## 2019-06-26 LAB — LIPID PANEL
Cholesterol: 213 mg/dL — ABNORMAL HIGH (ref 0–200)
HDL: 67.8 mg/dL (ref >39.00)
LDL Cholesterol: 116 mg/dL — ABNORMAL HIGH (ref 0–99)
NonHDL: 145.03
Total CHOL/HDL Ratio: 3
Triglycerides: 144 mg/dL (ref 0.0–149.0)
VLDL: 28.8 mg/dL (ref 0.0–40.0)

## 2019-06-26 LAB — HEMOGLOBIN A1C: Hgb A1c MFr Bld: 5.6 % (ref 4.6–6.5)

## 2019-06-26 LAB — TSH: TSH: 3.56 u[IU]/mL (ref 0.35–4.50)

## 2019-06-30 NOTE — Patient Instructions (Addendum)
We reviewed your blood work today  All other Health Maintenance issues reviewed.   All recommended immunizations and age-appropriate screenings are up-to-date or discussed.  No immunization administered today.   Medications reviewed and updated.  Changes include :   none   Please followup in 6 months   Health Maintenance, Female Adopting a healthy lifestyle and getting preventive care are important in promoting health and wellness. Ask your health care provider about:  The right schedule for you to have regular tests and exams.  Things you can do on your own to prevent diseases and keep yourself healthy. What should I know about diet, weight, and exercise? Eat a healthy diet   Eat a diet that includes plenty of vegetables, fruits, low-fat dairy products, and lean protein.  Do not eat a lot of foods that are high in solid fats, added sugars, or sodium. Maintain a healthy weight Body mass index (BMI) is used to identify weight problems. It estimates body fat based on height and weight. Your health care provider can help determine your BMI and help you achieve or maintain a healthy weight. Get regular exercise Get regular exercise. This is one of the most important things you can do for your health. Most adults should:  Exercise for at least 150 minutes each week. The exercise should increase your heart rate and make you sweat (moderate-intensity exercise).  Do strengthening exercises at least twice a week. This is in addition to the moderate-intensity exercise.  Spend less time sitting. Even light physical activity can be beneficial. Watch cholesterol and blood lipids Have your blood tested for lipids and cholesterol at 71 years of age, then have this test every 5 years. Have your cholesterol levels checked more often if:  Your lipid or cholesterol levels are high.  You are older than 71 years of age.  You are at high risk for heart disease. What should I know about cancer  screening? Depending on your health history and family history, you may need to have cancer screening at various ages. This may include screening for:  Breast cancer.  Cervical cancer.  Colorectal cancer.  Skin cancer.  Lung cancer. What should I know about heart disease, diabetes, and high blood pressure? Blood pressure and heart disease  High blood pressure causes heart disease and increases the risk of stroke. This is more likely to develop in people who have high blood pressure readings, are of African descent, or are overweight.  Have your blood pressure checked: ? Every 3-5 years if you are 56-73 years of age. ? Every year if you are 46 years old or older. Diabetes Have regular diabetes screenings. This checks your fasting blood sugar level. Have the screening done:  Once every three years after age 33 if you are at a normal weight and have a low risk for diabetes.  More often and at a younger age if you are overweight or have a high risk for diabetes. What should I know about preventing infection? Hepatitis B If you have a higher risk for hepatitis B, you should be screened for this virus. Talk with your health care provider to find out if you are at risk for hepatitis B infection. Hepatitis C Testing is recommended for:  Everyone born from [birth year] through 1963.  Anyone with known risk factors for hepatitis C. Sexually transmitted infections (STIs)  Get screened for STIs, including gonorrhea and chlamydia, if: ? You are sexually active and are younger than 71 years of age. ?  You are older than 71 years of age and your health care provider tells you that you are at risk for this type of infection. ? Your sexual activity has changed since you were last screened, and you are at increased risk for chlamydia or gonorrhea. Ask your health care provider if you are at risk.  Ask your health care provider about whether you are at high risk for HIV. Your health care provider may  recommend a prescription medicine to help prevent HIV infection. If you choose to take medicine to prevent HIV, you should first get tested for HIV. You should then be tested every 3 months for as long as you are taking the medicine. Pregnancy  If you are about to stop having your period (premenopausal) and you may become pregnant, seek counseling before you get pregnant.  Take 400 to 800 micrograms (mcg) of folic acid every day if you become pregnant.  Ask for birth control (contraception) if you want to prevent pregnancy. Osteoporosis and menopause Osteoporosis is a disease in which the bones lose minerals and strength with aging. This can result in bone fractures. If you are 11 years old or older, or if you are at risk for osteoporosis and fractures, ask your health care provider if you should:  Be screened for bone loss.  Take a calcium or vitamin D supplement to lower your risk of fractures.  Be given hormone replacement therapy (HRT) to treat symptoms of menopause. Follow these instructions at home: Lifestyle  Do not use any products that contain nicotine or tobacco, such as cigarettes, e-cigarettes, and chewing tobacco. If you need help quitting, ask your health care provider.  Do not use street drugs.  Do not share needles.  Ask your health care provider for help if you need support or information about quitting drugs. Alcohol use  Do not drink alcohol if: ? Your health care provider tells you not to drink. ? You are pregnant, may be pregnant, or are planning to become pregnant.  If you drink alcohol: ? Limit how much you use to 0-1 drink a day. ? Limit intake if you are breastfeeding.  Be aware of how much alcohol is in your drink. In the [place] one drink equals one 12 oz bottle of beer (355 mL), one 5 oz glass of wine (148 mL), or one 1 oz glass of hard liquor (44 mL). General instructions  Schedule regular health, dental, and eye exams.  Stay current with your  vaccines.  Tell your health care provider if: ? You often feel depressed. ? You have ever been abused or do not feel safe at home. Summary  Adopting a healthy lifestyle and getting preventive care are important in promoting health and wellness.  Follow your health care provider's instructions about healthy diet, exercising, and getting tested or screened for diseases.  Follow your health care provider's instructions on monitoring your cholesterol and blood pressure. This information is not intended to replace advice given to you by your health care provider. Make sure you discuss any questions you have with your health care provider. Document Released: 03/28/2011 Document Revised: 09/05/2018 Document Reviewed: 09/05/2018 Elsevier Patient Education  2020 [employer].

## 2019-06-30 NOTE — Progress Notes (Signed)
Subjective:    Patient ID: [name], female    DOB: 1945-11-20, 73 y.o.   MRN: [id number]  HPI She is here for a physical exam.   She denies changes in her health.   She has no major concerns.   Medications and allergies reviewed with patient and updated if appropriate.  Patient Active Problem List   Diagnosis Date Noted  . Borderline hypertension 06/27/2018  . Venous insufficiency of right leg 06/23/2017  . Anxiety and depression 09/11/2016  . Prediabetes 06/21/2016  . Hypothyroidism (acquired) 05/17/2011  . NEPHROLITHIASIS, HX OF 12/03/2007    Current Outpatient Medications on File Prior to Visit  Medication Sig Dispense Refill  . APPLE CIDER VINEGAR PO Take by mouth. GOLI ACV gummies    . aspirin 81 MG tablet Take 81 mg by mouth daily.      Marland Kitchen escitalopram (LEXAPRO) 10 MG tablet Take 1 tablet by mouth once daily (Patient taking differently: as needed. ) 90 tablet 0  . levothyroxine (SYNTHROID) 25 MCG tablet Take 25 mcg by mouth daily before breakfast.     No current facility-administered medications on file prior to visit.     Past Medical History:  Diagnosis Date  . Anxiety and depression 09/11/2016  . Borderline diabetes mellitus   . Chest pain   . Elevated TSH 06/23/2017  . GERD (gastroesophageal reflux disease)   . Grief reaction   . Hyperlipidemia   . Palpitations   . Personal history of urinary calculi   . Prediabetes 06/21/2016  . 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 Subclinical hypothyroidism 05/17/2011  . Venous insufficiency of right leg 06/23/2017   Follows with vascular  . Weakness     Past Surgical History:  Procedure Laterality Date  . CHOLECYSTECTOMY N/A 06/13/2017   Procedure: LAPAROSCOPIC CHOLECYSTECTOMY;  Surgeon: Vickie Epley, MD;  Location: [facility] ORS;  Service: General;  Laterality: N/A;  . COLONOSCOPY    . MASS EXCISION     right upper inner thigh or cyst  . WISDOM TOOTH EXTRACTION      Social History    Socioeconomic History  . Marital status: Widowed    Spouse name: Not on file  . Number of children: 0  . Years of education: Not on file  . Highest education level: Not on file  Occupational History  . Not on file  Social Needs  . Financial resource strain: Not on file  . Food insecurity    Worry: Not on file    Inability: Not on file  . Transportation needs    Medical: Not on file    Non-medical: Not on file  Tobacco Use  . Smoking status: Never Smoker  . Smokeless tobacco: Never Used  Substance and Sexual Activity  . Alcohol use: No  . Drug use: No  . Sexual activity: Not on file  Lifestyle  . Physical activity    Days per week: Not on file    Minutes per session: Not on file  . Stress: Not on file  Relationships  . Social Herbalist on phone: Not on file    Gets together: Not on file    Attends religious service: Not on file    Active member of club or organization: Not on file    Attends meetings of clubs or organizations: Not on file    Relationship status: Not on file  Other Topics Concern  .  Not on file  Social History Narrative   Retired Merchandiser, retail for [employer]   Married (husband - Eddie Dibbles), no children   Never Smoked    Alcohol use-no      Daily Caffeine Use 2/day    Family History  Problem Relation Age of Onset  . COPD Mother        deceased 10/23/08  . Alzheimer's disease Father        deceased 08/23/2009  . Coronary artery disease Father        s/p cabg  . Breast cancer Maternal Grandmother   . Colon cancer Neg Hx   . Colon polyps Neg Hx   . Esophageal cancer Neg Hx   . Rectal cancer Neg Hx   . Stomach cancer Neg Hx     Review of Systems  Constitutional: Negative for chills and fever.  HENT: Negative for trouble swallowing.   Eyes: Negative for visual disturbance.  Respiratory: Negative for cough, shortness of breath and wheezing.   Cardiovascular: Positive for leg swelling (right ankle). Negative for chest  pain and palpitations.  Gastrointestinal: Negative for abdominal pain, blood in stool, constipation, diarrhea and nausea.       GERD daily  Genitourinary: Negative for dysuria and hematuria.  Musculoskeletal: Positive for arthralgias (diffuse).  Skin: Negative for color change and rash.  Neurological: Negative for light-headedness and headaches.  Psychiatric/Behavioral: Positive for dysphoric mood (mild at times). The patient is nervous/anxious (mild at times).        Objective:   Vitals:   07/01/19 0743  BP: 138/62  Pulse: 74  Resp: 16  Temp: 98.6 F (37 C)  SpO2: 98%   Filed Weights   07/01/19 0743  Weight: 240 lb 12.8 oz (109.2 kg)   Body mass index is 36.61 kg/m.  BP Readings from Last 3 Encounters:  07/01/19 138/62  06/06/19 120/72  06/27/18 (!) 142/82    Wt Readings from Last 3 Encounters:  07/01/19 240 lb 12.8 oz (109.2 kg)  06/06/19 239 lb (108.4 kg)  05/23/19 239 lb (108.4 kg)     Physical Exam Constitutional: She appears well-developed and well-nourished. No distress.  HENT:  Head: Normocephalic and atraumatic.  Right Ear: External ear normal. Normal ear canal and TM Left Ear: External ear normal.  Normal ear canal and TM Mouth/Throat: Oropharynx is clear and moist.  Eyes: Conjunctivae and EOM are normal.  Neck: Neck supple. No tracheal deviation present. No thyromegaly present.  No carotid bruit  Cardiovascular: Normal rate, regular rhythm and normal heart sounds.   No murmur heard.  No edema. Pulmonary/Chest: Effort normal and breath sounds normal. No respiratory distress. She has no wheezes. She has no rales.  Breast: deferred   Abdominal: Soft. She exhibits no distension. There is no tenderness.  Lymphadenopathy: She has no cervical adenopathy.  Skin: Skin is warm and dry. She is not diaphoretic.  Psychiatric: She has a normal mood and affect. Her behavior is normal.        Assessment & Plan:   Physical exam: Screening blood work     reviewed Immunizations  Flu vaccine up to date, discussed tdap and shingrix Colonoscopy  Up to date  Mammogram  Up to date  Gyn  Sees gyn every 2-3 years Dexa    Up to date  Eye exams  Not up to date - advised to schedule Exercise   Walking irregularly Weight   Advised weight loss Substance abuse  None Sees derm annually  See Problem List for Assessment and Plan of chronic medical problems.    FU in 6 months   The 10-year ASCVD risk score Mikey Bussing DC Brooke Bonito., et al., 2013) is: 11.9%   Values used to calculate the score:     Age: 64 years     Sex: Female     Is Non-Hispanic African American: No     Diabetic: No     Tobacco smoker: No     Systolic Blood Pressure: 0000000 mmHg     Is BP treated: No     HDL Cholesterol: 67.8 mg/dL     Total Cholesterol: 213 mg/dL

## 2019-07-01 ENCOUNTER — Ambulatory Visit (INDEPENDENT_AMBULATORY_CARE_PROVIDER_SITE_OTHER): Payer: PPO | Admitting: Internal Medicine

## 2019-07-01 ENCOUNTER — Other Ambulatory Visit: Payer: Self-pay

## 2019-07-01 ENCOUNTER — Encounter: Payer: Self-pay | Admitting: Internal Medicine

## 2019-07-01 ENCOUNTER — Telehealth: Payer: Self-pay

## 2019-07-01 VITALS — BP 138/62 | HR 74 | Temp 98.6°F | Resp 16 | Ht 68.0 in | Wt 240.8 lb

## 2019-07-01 DIAGNOSIS — E7849 Other hyperlipidemia: Secondary | ICD-10-CM

## 2019-07-01 DIAGNOSIS — R7303 Prediabetes: Secondary | ICD-10-CM | POA: Diagnosis not present

## 2019-07-01 DIAGNOSIS — Z0000 Encounter for general adult medical examination without abnormal findings: Secondary | ICD-10-CM

## 2019-07-01 DIAGNOSIS — F419 Anxiety disorder, unspecified: Secondary | ICD-10-CM

## 2019-07-01 DIAGNOSIS — E039 Hypothyroidism, unspecified: Secondary | ICD-10-CM

## 2019-07-01 DIAGNOSIS — F329 Major depressive disorder, single episode, unspecified: Secondary | ICD-10-CM

## 2019-07-01 DIAGNOSIS — F32A Depression, unspecified: Secondary | ICD-10-CM

## 2019-07-01 NOTE — Assessment & Plan Note (Addendum)
Controlled, stable Taking lexapro only as needed, which works for her [place] to continue as needed

## 2019-07-01 NOTE — Telephone Encounter (Signed)
Ok to try 

## 2019-07-01 NOTE — Assessment & Plan Note (Signed)
Check a1c Low sugar / carb diet Stressed regular exercise   

## 2019-07-01 NOTE — Telephone Encounter (Signed)
Pt aware.

## 2019-07-01 NOTE — Assessment & Plan Note (Signed)
Clinically euthyroid Check tsh  Titrate med dose if needed  

## 2019-07-01 NOTE — Telephone Encounter (Signed)
Pt wanted to know your opinion on taking focus factor and a fruit and veggie capsule.

## 2019-07-29 DIAGNOSIS — Z1231 Encounter for screening mammogram for malignant neoplasm of breast: Secondary | ICD-10-CM | POA: Diagnosis not present

## 2019-07-29 LAB — HM MAMMOGRAPHY

## 2019-08-20 DIAGNOSIS — L814 Other melanin hyperpigmentation: Secondary | ICD-10-CM | POA: Diagnosis not present

## 2019-08-20 DIAGNOSIS — L821 Other seborrheic keratosis: Secondary | ICD-10-CM | POA: Diagnosis not present

## 2019-08-20 DIAGNOSIS — L720 Epidermal cyst: Secondary | ICD-10-CM | POA: Diagnosis not present

## 2019-08-20 DIAGNOSIS — D225 Melanocytic nevi of trunk: Secondary | ICD-10-CM | POA: Diagnosis not present

## 2019-08-20 DIAGNOSIS — D692 Other nonthrombocytopenic purpura: Secondary | ICD-10-CM | POA: Diagnosis not present

## 2019-08-20 DIAGNOSIS — Z85828 Personal history of other malignant neoplasm of skin: Secondary | ICD-10-CM | POA: Diagnosis not present

## 2019-08-21 ENCOUNTER — Other Ambulatory Visit: Payer: Self-pay | Admitting: Internal Medicine

## 2019-10-06 ENCOUNTER — Encounter: Payer: Self-pay | Admitting: Internal Medicine

## 2019-12-31 NOTE — Progress Notes (Signed)
Subjective:    Patient ID: [name], female    DOB: 1946/01/29, 74 y.o.   MRN: [id number]  HPI The patient is here for follow up of their chronic medical problems, including prediabetes, hypothyroidism, anxiety, depression.  She is taking all of her medications as prescribed.   She is not exercising regularly.       Medications and allergies reviewed with patient and updated if appropriate.  Patient Active Problem List   Diagnosis Date Noted  . Venous insufficiency of right leg 06/23/2017  . Anxiety and depression 09/11/2016  . Prediabetes 06/21/2016  . Hypothyroidism (acquired) 05/17/2011  . NEPHROLITHIASIS, HX OF 12/03/2007    Current Outpatient Medications on File Prior to Visit  Medication Sig Dispense Refill  . aspirin 81 MG tablet Take 81 mg by mouth daily.      Marland Kitchen escitalopram (LEXAPRO) 10 MG tablet Take 1 tablet by mouth once daily 90 tablet 1  . levothyroxine (SYNTHROID) 25 MCG tablet TAKE 1 TABLET BY MOUTH ONCE DAILY BEFORE BREAKFAST 90 tablet 1   No current facility-administered medications on file prior to visit.    Past Medical History:  Diagnosis Date  . Anxiety and depression 09/11/2016  . Borderline diabetes mellitus   . Chest pain   . Elevated TSH 06/23/2017  . GERD (gastroesophageal reflux disease)   . Grief reaction   . Hyperlipidemia   . Palpitations   . Personal history of urinary calculi   . Prediabetes 06/21/2016  . 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 Subclinical hypothyroidism 05/17/2011  . Venous insufficiency of right leg 06/23/2017   Follows with vascular  . Weakness     Past Surgical History:  Procedure Laterality Date  . CHOLECYSTECTOMY N/A 06/13/2017   Procedure: LAPAROSCOPIC CHOLECYSTECTOMY;  Surgeon: Vickie Epley, MD;  Location: [facility] ORS;  Service: General;  Laterality: N/A;  . COLONOSCOPY    . MASS EXCISION     right upper inner thigh or cyst  . WISDOM TOOTH EXTRACTION      Social History    Socioeconomic History  . Marital status: Widowed    Spouse name: Not on file  . Number of children: 0  . Years of education: Not on file  . Highest education level: Not on file  Occupational History  . Not on file  Tobacco Use  . Smoking status: Never Smoker  . Smokeless tobacco: Never Used  Substance and Sexual Activity  . Alcohol use: No  . Drug use: No  . Sexual activity: Not on file  Other Topics Concern  . Not on file  Social History Narrative   Retired Merchandiser, retail for [employer]   Married (husband - Eddie Dibbles), no children   Never Smoked    Alcohol use-no      Daily Caffeine Use 2/day   Social Determinants of Health   Financial Resource Strain:   . Difficulty of Paying Living Expenses:   Food Insecurity:   . Worried About Charity fundraiser in the Last Year:   . Arboriculturist in the Last Year:   Transportation Needs:   . Film/video editor (Medical):   Marland Kitchen Lack of Transportation (Non-Medical):   Physical Activity:   . Days of Exercise per Week:   . Minutes of Exercise per Session:   Stress:   . Feeling of Stress :   Social Connections:   . Frequency of Communication with  Friends and Family:   . Frequency of Social Gatherings with Friends and Family:   . Attends Religious Services:   . Active Member of Clubs or Organizations:   . Attends Archivist Meetings:   Marland Kitchen Marital Status:     Family History  Problem Relation Age of Onset  . COPD Mother        deceased 10-24-08  . Alzheimer's disease Father        deceased 2009-08-24  . Coronary artery disease Father        s/p cabg  . Breast cancer Maternal Grandmother   . Colon cancer Neg Hx   . Colon polyps Neg Hx   . Esophageal cancer Neg Hx   . Rectal cancer Neg Hx   . Stomach cancer Neg Hx     Review of Systems  Constitutional: Negative for chills and fever.  Respiratory: Negative for cough, shortness of breath and wheezing.   Cardiovascular: Positive for leg swelling  (right ankle - on her feet a lot). Negative for chest pain and palpitations.  Neurological: Negative for dizziness, light-headedness and headaches.       Objective:   Vitals:   01/01/20 0756  BP: 132/80  Pulse: 68  Resp: 16  Temp: 99 F (37.2 C)  SpO2: 97%   BP Readings from Last 3 Encounters:  01/01/20 132/80  07/01/19 138/62  06/06/19 120/72   Wt Readings from Last 3 Encounters:  01/01/20 244 lb (110.7 kg)  07/01/19 240 lb 12.8 oz (109.2 kg)  06/06/19 239 lb (108.4 kg)   Body mass index is 37.1 kg/m.   Physical Exam    Constitutional: Appears well-developed and well-nourished. No distress.  HENT:  Head: Normocephalic and atraumatic.  Neck: Neck supple. No tracheal deviation present. No thyromegaly present.  No cervical lymphadenopathy Cardiovascular: Normal rate, regular rhythm and normal heart sounds.   No murmur heard. No carotid bruit .  No edema Pulmonary/Chest: Effort normal and breath sounds normal. No respiratory distress. No has no wheezes. No rales.  Skin: Skin is warm and dry. Not diaphoretic.  Psychiatric: Normal mood and affect. Behavior is normal.      Assessment & Plan:    See Problem List for Assessment and Plan of chronic medical problems.    This visit occurred during the SARS-CoV-2 public health emergency.  Safety protocols were in place, including screening questions prior to the visit, additional usage of staff PPE, and extensive cleaning of exam room while observing appropriate contact time as indicated for disinfecting solutions.

## 2019-12-31 NOTE — Patient Instructions (Addendum)
  Blood work was ordered.   ° ° °Medications reviewed and updated.  Changes include :   none ° ° ° °Please followup in 6 months ° ° °

## 2020-01-01 ENCOUNTER — Encounter: Payer: Self-pay | Admitting: Internal Medicine

## 2020-01-01 ENCOUNTER — Other Ambulatory Visit: Payer: Self-pay

## 2020-01-01 ENCOUNTER — Ambulatory Visit (INDEPENDENT_AMBULATORY_CARE_PROVIDER_SITE_OTHER): Payer: PPO | Admitting: Internal Medicine

## 2020-01-01 VITALS — BP 132/80 | HR 68 | Temp 99.0°F | Resp 16 | Ht 68.0 in | Wt 244.0 lb

## 2020-01-01 DIAGNOSIS — R7303 Prediabetes: Secondary | ICD-10-CM | POA: Diagnosis not present

## 2020-01-01 DIAGNOSIS — F419 Anxiety disorder, unspecified: Secondary | ICD-10-CM

## 2020-01-01 DIAGNOSIS — E039 Hypothyroidism, unspecified: Secondary | ICD-10-CM | POA: Diagnosis not present

## 2020-01-01 DIAGNOSIS — F329 Major depressive disorder, single episode, unspecified: Secondary | ICD-10-CM

## 2020-01-01 LAB — COMPREHENSIVE METABOLIC PANEL
ALT: 18 U/L (ref 0–35)
AST: 23 U/L (ref 0–37)
Albumin: 3.9 g/dL (ref 3.5–5.2)
Alkaline Phosphatase: 56 U/L (ref 39–117)
BUN: 18 mg/dL (ref 6–23)
CO2: 27 mEq/L (ref 19–32)
Calcium: 9.1 mg/dL (ref 8.4–10.5)
Chloride: 103 mEq/L (ref 96–112)
Creatinine, Ser: 0.95 mg/dL (ref 0.40–1.20)
GFR: 57.78 mL/min — ABNORMAL LOW (ref >60.00)
Glucose, Bld: 107 mg/dL — ABNORMAL HIGH (ref 70–99)
Potassium: 4.3 mEq/L (ref 3.5–5.1)
Sodium: 137 mEq/L (ref 135–145)
Total Bilirubin: 0.5 mg/dL (ref 0.2–1.2)
Total Protein: 6.9 g/dL (ref 6.0–8.3)

## 2020-01-01 LAB — TSH: TSH: 3.21 u[IU]/mL (ref 0.35–4.50)

## 2020-01-01 LAB — HEMOGLOBIN A1C: Hgb A1c MFr Bld: 5.6 % (ref 4.6–6.5)

## 2020-01-01 NOTE — Assessment & Plan Note (Signed)
Chronic Controlled, stable Continue current dose of medication- lexapro 10 mg daily  

## 2020-01-01 NOTE — Assessment & Plan Note (Signed)
Chronic  Clinically euthyroid Check tsh  Titrate med dose if needed  

## 2020-01-01 NOTE — Assessment & Plan Note (Signed)
Chronic Check a1c Low sugar / carb diet Stressed regular exercise  

## 2020-01-28 ENCOUNTER — Ambulatory Visit (INDEPENDENT_AMBULATORY_CARE_PROVIDER_SITE_OTHER): Payer: PPO

## 2020-01-28 DIAGNOSIS — Z0000 Encounter for general adult medical examination without abnormal findings: Secondary | ICD-10-CM

## 2020-01-28 NOTE — Patient Instructions (Signed)
[name] , Thank you for taking time to come for your Medicare Wellness Visit. I appreciate your ongoing commitment to your health goals. Please review the following plan we discussed and let me know if I can assist you in the future.   Screening recommendations/referrals: Colorectal Screening: 06/06/2019 Mammogram: 07/29/2019 Bone Density: 07/25/2018  Vision and Dental Exams: Recommended annual ophthalmology exams for early detection of glaucoma and other disorders of the eye Recommended annual dental exams for proper oral hygiene  Vaccinations: Influenza vaccine: 06/28/2019 Pneumococcal vaccine: completed; 07/09/2013 and 06/23/2017 Tdap vaccine: not done Shingles vaccine: Please call your insurance company to determine your out of pocket expense for the Shingrix vaccine. You may receive this vaccine at your local pharmacy. Covid vaccine: not done  Advanced directives: Advance directives discussed with you today. Please bring a copy of your POA (Power of Taylor) and/or Living Will to your next appointment.  Goals: Recommend to drink at least 6-8 8oz glasses of water per day.  Recommend to exercise for at least 150 minutes per week.  Recommend to remove any items from the home that may cause slips or trips.  Recommend to decrease portion sizes by eating 3 small healthy meals and at least 2 healthy snacks per day.  Recommend to begin DASH diet as directed below  Recommend to continue efforts to reduce smoking habits until no longer smoking. Smoking Cessation literature is attached below.  Next appointment: Please schedule your Annual Wellness Visit with your Nurse Health Advisor in one year.  Preventive Care 60 Years and Older, Female Preventive care refers to lifestyle choices and visits with your health care provider that can promote health and wellness. What does preventive care include?  A yearly physical exam. This is also called an annual well check.  Dental exams once  or twice a year.  Routine eye exams. Ask your health care provider how often you should have your eyes checked.  Personal lifestyle choices, including:  Daily care of your teeth and gums.  Regular physical activity.  Eating a healthy diet.  Avoiding tobacco and drug use.  Limiting alcohol use.  Practicing safe sex.  Taking low-dose aspirin every day if recommended by your health care provider.  Taking vitamin and mineral supplements as recommended by your health care provider. What happens during an annual well check? The services and screenings done by your health care provider during your annual well check will depend on your age, overall health, lifestyle risk factors, and family history of disease. Counseling  Your health care provider may ask you questions about your:  Alcohol use.  Tobacco use.  Drug use.  Emotional well-being.  Home and relationship well-being.  Sexual activity.  Eating habits.  History of falls.  Memory and ability to understand (cognition).  Work and work Statistician.  Reproductive health. Screening  You may have the following tests or measurements:  Height, weight, and BMI.  Blood pressure.  Lipid and cholesterol levels. These may be checked every 5 years, or more frequently if you are over 78 years old.  Skin check.  Lung cancer screening. You may have this screening every year starting at age 44 if you have a 30-pack-year history of smoking and currently smoke or have quit within the past 15 years.  Fecal occult blood test (FOBT) of the stool. You may have this test every year starting at age 13.  Flexible sigmoidoscopy or colonoscopy. You may have a sigmoidoscopy every 5 years or a colonoscopy every 10 years  starting at age 11.  Hepatitis C blood test.  Hepatitis B blood test.  Sexually transmitted disease (STD) testing.  Diabetes screening. This is done by checking your blood sugar (glucose) after you have not eaten  for a while (fasting). You may have this done every 1-3 years.  Bone density scan. This is done to screen for osteoporosis. You may have this done starting at age 17.  Mammogram. This may be done every 1-2 years. Talk to your health care provider about how often you should have regular mammograms. Talk with your health care provider about your test results, treatment options, and if necessary, the need for more tests. Vaccines  Your health care provider may recommend certain vaccines, such as:  Influenza vaccine. This is recommended every year.  Tetanus, diphtheria, and acellular pertussis (Tdap, Td) vaccine. You may need a Td booster every 10 years.  Zoster vaccine. You may need this after age 26.  Pneumococcal 13-valent conjugate (PCV13) vaccine. One dose is recommended after age 56.  Pneumococcal polysaccharide (PPSV23) vaccine. One dose is recommended after age 35. Talk to your health care provider about which screenings and vaccines you need and how often you need them. This information is not intended to replace advice given to you by your health care provider. Make sure you discuss any questions you have with your health care provider. Document Released: 10/09/2015 Document Revised: 06/01/2016 Document Reviewed: 07/14/2015 Elsevier Interactive Patient Education  2017 [employer] Prevention in the Home Falls can cause injuries. They can happen to people of all ages. There are many things you can do to make your home safe and to help prevent falls. What can I do on the outside of my home?  Regularly fix the edges of walkways and driveways and fix any cracks.  Remove anything that might make you trip as you walk through a door, such as a raised step or threshold.  Trim any bushes or trees on the path to your home.  Use bright outdoor lighting.  Clear any walking paths of anything that might make someone trip, such as rocks or tools.  Regularly check to see if  handrails are loose or broken. Make sure that both sides of any steps have handrails.  Any raised decks and porches should have guardrails on the edges.  Have any leaves, snow, or ice cleared regularly.  Use sand or salt on walking paths during winter.  Clean up any spills in your garage right away. This includes oil or grease spills. What can I do in the bathroom?  Use night lights.  Install grab bars by the toilet and in the tub and shower. Do not use towel bars as grab bars.  Use non-skid mats or decals in the tub or shower.  If you need to sit down in the shower, use a plastic, non-slip stool.  Keep the floor dry. Clean up any water that spills on the floor as soon as it happens.  Remove soap buildup in the tub or shower regularly.  Attach bath mats securely with double-sided non-slip rug tape.  Do not have throw rugs and other things on the floor that can make you trip. What can I do in the bedroom?  Use night lights.  Make sure that you have a light by your bed that is easy to reach.  Do not use any sheets or blankets that are too big for your bed. They should not hang down onto the floor.  Have a  firm chair that has side arms. You can use this for support while you get dressed.  Do not have throw rugs and other things on the floor that can make you trip. What can I do in the kitchen?  Clean up any spills right away.  Avoid walking on wet floors.  Keep items that you use a lot in easy-to-reach places.  If you need to reach something above you, use a strong step stool that has a grab bar.  Keep electrical cords out of the way.  Do not use floor polish or wax that makes floors slippery. If you must use wax, use non-skid floor wax.  Do not have throw rugs and other things on the floor that can make you trip. What can I do with my stairs?  Do not leave any items on the stairs.  Make sure that there are handrails on both sides of the stairs and use them. Fix  handrails that are broken or loose. Make sure that handrails are as long as the stairways.  Check any carpeting to make sure that it is firmly attached to the stairs. Fix any carpet that is loose or worn.  Avoid having throw rugs at the top or bottom of the stairs. If you do have throw rugs, attach them to the floor with carpet tape.  Make sure that you have a light switch at the top of the stairs and the bottom of the stairs. If you do not have them, ask someone to add them for you. What else can I do to help prevent falls?  Wear shoes that:  Do not have high heels.  Have rubber bottoms.  Are comfortable and fit you well.  Are closed at the toe. Do not wear sandals.  If you use a stepladder:  Make sure that it is fully opened. Do not climb a closed stepladder.  Make sure that both sides of the stepladder are locked into place.  Ask someone to hold it for you, if possible.  Clearly mark and make sure that you can see:  Any grab bars or handrails.  First and last steps.  Where the edge of each step is.  Use tools that help you move around (mobility aids) if they are needed. These include:  Canes.  Walkers.  Scooters.  Crutches.  Turn on the lights when you go into a dark area. Replace any light bulbs as soon as they burn out.  Set up your furniture so you have a clear path. Avoid moving your furniture around.  If any of your floors are uneven, fix them.  If there are any pets around you, be aware of where they are.  Review your medicines with your doctor. Some medicines can make you feel dizzy. This can increase your chance of falling. Ask your doctor what other things that you can do to help prevent falls. This information is not intended to replace advice given to you by your health care provider. Make sure you discuss any questions you have with your health care provider. Document Released: 07/09/2009 Document Revised: 02/18/2016 Document Reviewed:  10/17/2014 Elsevier Interactive Patient Education  2017 [employer].

## 2020-01-28 NOTE — Progress Notes (Signed)
Subjective:   [name] is a 74 y.o. female who presents for Medicare Annual (Subsequent) preventive examination.  Review of Systems:  No ROS. Medicare Wellness Virtual Visit. Additional risk factors are reflected in social history.  Cardiac Risk Factors include: advanced age (>20men, >64 women);dyslipidemia Sleep Patterns: No sleep issues, feels rested on waking and sleeps 8 hours nightly.  Home Safety/Smoke Alarms: Feels safe in home; uses home alarm. Smoke alarms in place. Living environment:1-story home.  Lives alone, no needs for DME, good support system. Seat Belt Safety/Bike Helmet: Wears seat belt.    Objective:    This visit is being conducted via phone call due to the COVID-19 pandemic. This patient has given me verbal consent via phone to conduct this visit, patient states they are participating from their home address. Some vital signs may be absent or patient reported.   Patient identification: identified by name, DOB, and current address.  Location provider: [facility], Office Persons participating in the virtual visit: Billey Gosling, MD, Nurse Health Advisor and patient.    Vitals: There were no vitals taken for this visit.  There is no height or weight on file to calculate BMI.  Advanced Directives 01/28/2020 06/13/2017  Does Patient Have a Medical Advance Directive? Yes Yes  Type of Paramedic of [place];Living will Living will;Healthcare Power of Attorney  Does patient want to make changes to medical advance directive? No - Patient declined No - Patient declined  Copy of [place] in Chart? No - copy requested No - copy requested    Tobacco Social History   Tobacco Use  Smoking Status Never Smoker  Smokeless Tobacco Never Used     Counseling given: Not Answered   Clinical Intake:  Pre-visit preparation completed: Yes  Pain : No/denies pain     Nutritional Risks: None Diabetes: No  How often do  you need to have someone help you when you read instructions, pamphlets, or other written materials from your doctor or pharmacy?: 1 - Never What is the last grade level you completed in school?: 12th grade; Retired  Astronomer Needed?: No  Information entered by :: [employer]. [employer]ell [employer], LPN  Past Medical History:  Diagnosis Date  . Anxiety and depression 09/11/2016  . Borderline diabetes mellitus   . Chest pain   . Elevated TSH 06/23/2017  . GERD (gastroesophageal reflux disease)   . Grief reaction   . Hyperlipidemia   . Palpitations   . Personal history of urinary calculi   . Prediabetes 06/21/2016  . 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 Subclinical hypothyroidism 05/17/2011  . Venous insufficiency of right leg 06/23/2017   Follows with vascular  . Weakness    Past Surgical History:  Procedure Laterality Date  . CHOLECYSTECTOMY N/A 06/13/2017   Procedure: LAPAROSCOPIC CHOLECYSTECTOMY;  Surgeon: Vickie Epley, MD;  Location: [facility] ORS;  Service: General;  Laterality: N/A;  . COLONOSCOPY    . MASS EXCISION     right upper inner thigh or cyst  . WISDOM TOOTH EXTRACTION     Family History  Problem Relation Age of Onset  . COPD Mother        deceased Nov 08, 2008  . Alzheimer's disease Father        deceased 09/08/2009  . Coronary artery disease Father        s/p cabg  . Breast cancer Maternal Grandmother   . Colon cancer Neg  Hx   . Colon polyps Neg Hx   . Esophageal cancer Neg Hx   . Rectal cancer Neg Hx   . Stomach cancer Neg Hx    Social History   Socioeconomic History  . Marital status: Widowed    Spouse name: Not on file  . Number of children: 0  . Years of education: Not on file  . Highest education level: Not on file  Occupational History  . Not on file  Tobacco Use  . Smoking status: Never Smoker  . Smokeless tobacco: Never Used  Substance and Sexual Activity  . Alcohol use: No  . Drug use: No  . Sexual activity: Not on file  Other  Topics Concern  . Not on file  Social History Narrative   Retired Merchandiser, retail for [employer]   Married (husband - Eddie Dibbles), no children   Never Smoked    Alcohol use-no      Daily Caffeine Use 2/day   Social Determinants of Health   Financial Resource Strain:   . Difficulty of Paying Living Expenses:   Food Insecurity:   . Worried About Charity fundraiser in the Last Year:   . Arboriculturist in the Last Year:   Transportation Needs:   . Film/video editor (Medical):   Marland Kitchen Lack of Transportation (Non-Medical):   Physical Activity:   . Days of Exercise per Week:   . Minutes of Exercise per Session:   Stress:   . Feeling of Stress :   Social Connections:   . Frequency of Communication with Friends and Family:   . Frequency of Social Gatherings with Friends and Family:   . Attends Religious Services:   . Active Member of Clubs or Organizations:   . Attends Archivist Meetings:   Marland Kitchen Marital Status:     Outpatient Encounter Medications as of 01/28/2020  Medication Sig  . aspirin 81 MG tablet Take 81 mg by mouth daily.    Marland Kitchen escitalopram (LEXAPRO) 10 MG tablet Take 1 tablet by mouth once daily  . levothyroxine (SYNTHROID) 25 MCG tablet TAKE 1 TABLET BY MOUTH ONCE DAILY BEFORE BREAKFAST   No facility-administered encounter medications on file as of 01/28/2020.    Activities of Daily Living In your present state of health, do you have any difficulty performing the following activities: 01/28/2020  Hearing? N  Vision? N  Difficulty concentrating or making decisions? N  Walking or climbing stairs? N  Dressing or bathing? N  Doing errands, shopping? N  Preparing Food and eating ? N  Using the Toilet? N  In the past six months, have you accidently leaked urine? N  Do you have problems with loss of bowel control? N  Managing your Medications? N  Managing your Finances? N  Housekeeping or managing your Housekeeping? N  Some recent data might be  hidden    Patient Care Team: Binnie Rail, MD as PCP - General (Internal Medicine)    Assessment:   This is a routine wellness examination for [name].  Exercise Activities and Dietary recommendations Current Exercise Habits: The patient does not participate in regular exercise at present, Exercise limited by: None identified  Goals    . Client understands the importance of follow-up with providers by attending scheduled visits       Fall Risk Fall Risk  01/28/2020 01/01/2020 06/23/2017 06/21/2016 07/09/2013  Falls in the past year? 0 0 No No No  Number falls in past  yr: 0 0 - - -  Injury with Fall? 0 0 - - -  Risk for fall due to : No Fall Risks - - - -  Follow up Falls evaluation completed;Education provided;Falls prevention discussed Falls evaluation completed - - -   Is the patient's home free of loose throw rugs in walkways, pet beds, electrical cords, etc?   yes      Grab bars in the bathroom? no      Handrails on the stairs?   yes      Adequate lighting?   yes   Depression Screen PHQ 2/9 Scores 01/28/2020 01/01/2020 07/01/2019 07/01/2019  PHQ - 2 Score 0 - 0 0  PHQ- 9 Score - - 1 -  Exception Documentation - Patient refusal - -     Cognitive Function     6CIT Screen 01/28/2020  What Year? 0 points  What month? 0 points  What time? 0 points  Count back from 20 0 points  Months in reverse 0 points  Repeat phrase 0 points  Total Score 0    Immunization History  Administered Date(s) Administered  . Influenza Whole 06/26/2008, 07/02/2009  . Influenza, High Dose Seasonal PF 06/23/2017, 07/26/2018, 06/28/2019  . Influenza-Unspecified 06/07/2016  . Pneumococcal Conjugate-13 06/23/2017  . Pneumococcal Polysaccharide-23 07/09/2013    Qualifies for Shingles Vaccine? Yes, will check with local pharmacy.  Screening Tests Health Maintenance  Topic Date Due  . COVID-19 Vaccine (1) Never done  . TETANUS/TDAP  06/30/2020 (Originally 10/01/1964)  . INFLUENZA VACCINE   04/26/2020  . MAMMOGRAM  07/28/2021  . DEXA SCAN  07/26/2023  . COLONOSCOPY  06/05/2029  . Hepatitis C Screening  Completed  . PNA vac Low Risk Adult  Completed    Cancer Screenings: Lung: Low Dose CT Chest recommended if Age 58-80 years, 30 pack-year currently smoking OR have quit w/in 15years. Patient does not qualify. Breast:  Up to date on Mammogram? Yes   Up to date of Bone Density/Dexa? Yes Colorectal: Yes     Plan:     Reviewed health maintenance screenings with patient today and relevant education, vaccines, and/or referrals were provided.    Continue doing brain stimulating activities (puzzles, reading, adult coloring books, staying active) to keep memory sharp.    Continue to eat heart healthy diet (full of fruits, vegetables, whole grains, lean protein, water--limit salt, fat, and sugar intake) and increase physical activity as tolerated.  I have personally reviewed and noted the following in the patient's chart:   . Medical and social history . Use of alcohol, tobacco or illicit drugs  . Current medications and supplements . Functional ability and status . Nutritional status . Physical activity . Advanced directives . List of other physicians . Hospitalizations, surgeries, and ER visits in previous 12 months . Vitals . Screenings to include cognitive, depression, and falls . Referrals and appointments  In addition, I have reviewed and discussed with patient certain preventive protocols, quality metrics, and best practice recommendations. A written personalized care plan for preventive services as well as general preventive health recommendations were provided to patient.     Sheral Flow, LPN  [id number]  Nurse Health Advisor  Nurse Notes: There were no vitals filed for this visit. There is no height or weight on file to calculate BMI.

## 2020-02-25 ENCOUNTER — Emergency Department: Payer: PPO

## 2020-02-25 ENCOUNTER — Encounter: Payer: Self-pay | Admitting: Emergency Medicine

## 2020-02-25 ENCOUNTER — Other Ambulatory Visit: Payer: Self-pay | Admitting: Internal Medicine

## 2020-02-25 ENCOUNTER — Other Ambulatory Visit: Payer: Self-pay

## 2020-02-25 ENCOUNTER — Emergency Department
Admission: EM | Admit: 2020-02-25 | Discharge: 2020-02-25 | Disposition: A | Discharge status: Home / Self Care | Payer: PPO | Attending: Emergency Medicine | Admitting: Emergency Medicine

## 2020-02-25 DIAGNOSIS — R2242 Localized swelling, mass and lump, left lower limb: Secondary | ICD-10-CM | POA: Insufficient documentation

## 2020-02-25 DIAGNOSIS — Y93E6 Activity, residential relocation: Secondary | ICD-10-CM | POA: Insufficient documentation

## 2020-02-25 DIAGNOSIS — S8012XA Contusion of left lower leg, initial encounter: Secondary | ICD-10-CM | POA: Diagnosis not present

## 2020-02-25 DIAGNOSIS — Y999 Unspecified external cause status: Secondary | ICD-10-CM | POA: Diagnosis not present

## 2020-02-25 DIAGNOSIS — W19XXXA Unspecified fall, initial encounter: Secondary | ICD-10-CM | POA: Diagnosis not present

## 2020-02-25 DIAGNOSIS — Y929 Unspecified place or not applicable: Secondary | ICD-10-CM | POA: Diagnosis not present

## 2020-02-25 DIAGNOSIS — M7989 Other specified soft tissue disorders: Secondary | ICD-10-CM | POA: Diagnosis not present

## 2020-02-25 DIAGNOSIS — S8992XA Unspecified injury of left lower leg, initial encounter: Secondary | ICD-10-CM | POA: Diagnosis present

## 2020-02-25 NOTE — ED Notes (Signed)
Awaiting registration to register pt

## 2020-02-25 NOTE — ED Provider Notes (Signed)
[facility] Emergency Department Provider Note  Time seen: 8:47 PM  I have reviewed the triage vital signs and the nursing notes.   HISTORY  Chief Complaint Fall and Knee Pain   HPI [name] is a 74 y.o. female with a past medical history of anxiety, reflux, hyperlipidemia, presents to the emergency department for left leg swelling after a fall.   According to the patient 1 week ago she had a fall while attempting to move out of her house.  Patient states she has had some mild discomfort to the leg although states that is largely resolved did had some bruising after the fall.  States she noticed that she has some discoloration down by her ankle as well as mild swelling of the left ankle.  Patient came to the emergency department concerned that she could possibly have a blood clot.  No history of DVTs in the past.  Patient does state she takes an 81 mg aspirin each day.  No chest pain or shortness of breath.  Past Medical History:  Diagnosis Date  . Anxiety and depression 09/11/2016  . Borderline diabetes mellitus   . Chest pain   . Elevated TSH 06/23/2017  . GERD (gastroesophageal reflux disease)   . Grief reaction   . Hyperlipidemia   . Palpitations   . Personal history of urinary calculi   . Prediabetes 06/21/2016  . 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 Subclinical hypothyroidism 05/17/2011  . Venous insufficiency of right leg 06/23/2017   Follows with vascular  . Weakness     Patient Active Problem List   Diagnosis Date Noted  . Venous insufficiency of right leg 06/23/2017  . Anxiety and depression 09/11/2016  . Prediabetes 06/21/2016  . Hypothyroidism (acquired) 05/17/2011  . NEPHROLITHIASIS, HX OF 12/03/2007    Past Surgical History:  Procedure Laterality Date  . CHOLECYSTECTOMY N/A 06/13/2017   Procedure: LAPAROSCOPIC CHOLECYSTECTOMY;  Surgeon: Vickie Epley, MD;  Location: [facility] ORS;  Service: General;   Laterality: N/A;  . COLONOSCOPY    . MASS EXCISION     right upper inner thigh or cyst  . WISDOM TOOTH EXTRACTION      Prior to Admission medications   Medication Sig Start Date End Date Taking? Authorizing Provider  aspirin 81 MG tablet Take 81 mg by mouth daily.      [provider]  escitalopram (LEXAPRO) 10 MG tablet Take 1 tablet by mouth once daily 08/21/19   Binnie Rail, MD  levothyroxine (SYNTHROID) 25 MCG tablet TAKE 1 TABLET BY MOUTH ONCE DAILY BEFORE BREAKFAST 08/21/19   Binnie Rail, MD    Allergies  Allergen Reactions  . Atorvastatin Other (See Comments)    REACTION: Upset stomach    Family History  Problem Relation Age of Onset  . COPD Mother        deceased 10-23-2008  . Alzheimer's disease Father        deceased Aug 23, 2009  . Coronary artery disease Father        s/p cabg  . Breast cancer Maternal Grandmother   . Colon cancer Neg Hx   . Colon polyps Neg Hx   . Esophageal cancer Neg Hx   . Rectal cancer Neg Hx   . Stomach cancer Neg Hx     Social History Social History   Tobacco Use  . Smoking status: Never Smoker  . Smokeless tobacco: Never Used  Substance  Use Topics  . Alcohol use: No  . Drug use: No    Review of Systems Constitutional: Negative for fever. Cardiovascular: Negative for chest pain. Respiratory: Negative for shortness of breath. Gastrointestinal: Negative for abdominal pain Musculoskeletal: Left leg swelling Skin: Bruising to left leg Neurological: Negative for headache All other ROS negative  ____________________________________________   PHYSICAL EXAM:  VITAL SIGNS: ED Triage Vitals  Enc Vitals Group     BP 02/25/20 1909 (!) 155/67     Pulse Rate 02/25/20 1909 66     Resp 02/25/20 1909 16     Temp 02/25/20 1909 99.3 F (37.4 C)     Temp Source 02/25/20 1909 Oral     SpO2 02/25/20 1909 98 %     Weight 02/25/20 1910 240 lb (108.9 kg)     Height 02/25/20 1910 5\' 8"  (1.727 m)     Head Circumference --       Peak Flow --      Pain Score 02/25/20 1910 0     Pain Loc --      Pain Edu? --      Excl. in [place]? --     Constitutional: Alert and oriented. Well appearing and in no distress. Eyes: Normal exam ENT      Head: Normocephalic and atraumatic.      Mouth/Throat: Mucous membranes are moist. Cardiovascular: Normal rate, regular rhythm.  Respiratory: Normal respiratory effort without tachypnea nor retractions. Breath sounds are clear  Gastrointestinal: Soft and nontender. No distention.   Musculoskeletal: Patient has mild ecchymosis left lower extremity around the mid tibia and ankle.  Very slight pedal edema.  Neurovascularly intact. Neurologic:  Normal speech and language. No gross focal neurologic deficits  Skin:  Skin is warm, dry and intact.  Psychiatric: Mood and affect are normal.   ____________________________________________   RADIOLOGY  Ultrasound negative for DVT.  ____________________________________________   INITIAL IMPRESSION / ASSESSMENT AND PLAN / ED COURSE  Pertinent labs & imaging results that were available during my care of the patient were reviewed by me and considered in my medical decision making (see chart for details).   Patient presents emergency department for evaluation of her left lower extremity where she has some mild bruising and swelling noted.  Patient fell 1 week ago.  Patient's exam is most consistent with gravity dependent ecchymosis.  Neurovascularly intact.  Ultrasound has resulted negative for DVT.  Will discharge patient with supportive care.  Patient agreeable.  [name] was evaluated in Emergency Department on 02/25/2020 for the symptoms described in the history of present illness. She was evaluated in the context of the global COVID-19 pandemic, which necessitated consideration that the patient might be at risk for infection with the SARS-CoV-2 virus that causes COVID-19. Institutional protocols and algorithms that pertain to the  evaluation of patients at risk for COVID-19 are in a state of rapid change based on information released by regulatory bodies including the CDC and federal and state organizations. These policies and algorithms were followed during the patient's care in the ED.  ____________________________________________   FINAL CLINICAL IMPRESSION(S) / ED DIAGNOSES  Hematoma   Harvest Dark, MD 02/25/20 2049

## 2020-02-25 NOTE — ED Triage Notes (Signed)
Pt to ED from home c/o fall over 1 week ago last Sunday, has been ambulatory but noticed swelling down in left ankle for a couple days.  Denies pain but states is tight.  Denies SOB, skin color WNL, in NAD at this time.

## 2020-02-25 NOTE — ED Notes (Signed)
Pt states that she fell 2 weeks ago due to tripping over the garden hose. Reports the last 2 days she has experienced increased swelling and bruising in the left ankle and calf. Pt denies pain.

## 2020-03-23 NOTE — Progress Notes (Signed)
Subjective:    Patient ID: [name], female    DOB: May 15, 1946, 74 y.o.   MRN: [id number]  HPI The patient is here for follow up from urgent care.   6/1  She went to urgent care for left knee pain, ankle pain and swelling from fall in garage 2 weeks prior.  She was sent to [facility] [place] ED to r/o post-traumatic DVT.  LLE had bruising and swelling.  DVT ruled out.  She was diagnosed with gravity dependent ecchymosis.    ED notes reviewed.     Her left knee cap is numb.  The knee is not painful.  No knee swelling, no left calf pain / swelling.  She can walk without difficulty.    She has been having temporal pain on the right temple region.  It is severe, and associated with right ear canal pain and posterior ear pain and pain in her throat.  This started 2-3 weeks ago.  It is sharp pain and debilitating and it does not last long.    She has just moved and has been very busy packing, moving and unpacking.    US Venous Img Lower Unilateral Left CLINICAL DATA:  Initial evaluation for acute left lower extremity swelling.  EXAM: LEFT LOWER EXTREMITY VENOUS DOPPLER ULTRASOUND  TECHNIQUE: Gray-scale sonography with graded compression, as well as color Doppler and duplex ultrasound were performed to evaluate the lower extremity deep venous systems from the level of the common femoral vein and including the common femoral, femoral, profunda femoral, popliteal and calf veins including the posterior tibial, peroneal and gastrocnemius veins when visible. The superficial great saphenous vein was also interrogated. Spectral Doppler was utilized to evaluate flow at rest and with distal augmentation maneuvers in the common femoral, femoral and popliteal veins.  COMPARISON:  None.  FINDINGS: Contralateral Common Femoral Vein: Respiratory phasicity is normal and symmetric with the symptomatic side. No evidence of thrombus. Normal compressibility.  Common Femoral Vein: No evidence  of thrombus. Normal compressibility, respiratory phasicity and response to augmentation.  Saphenofemoral Junction: No evidence of thrombus. Normal compressibility and flow on color Doppler imaging.  Profunda Femoral Vein: No evidence of thrombus. Normal compressibility and flow on color Doppler imaging.  Femoral Vein: No evidence of thrombus. Normal compressibility, respiratory phasicity and response to augmentation.  Popliteal Vein: No evidence of thrombus. Normal compressibility, respiratory phasicity and response to augmentation.  Calf Veins: No evidence of thrombus. Normal compressibility and flow on color Doppler imaging.  Superficial Great Saphenous Vein: No evidence of thrombus. Normal compressibility.  Venous Reflux:  None.  Other Findings:  None.  IMPRESSION: No evidence of deep venous thrombosis.  Electronically Signed   By: Jeannine Boga M.D.   On: 02/25/2020 20:20    Medications and allergies reviewed with patient and updated if appropriate.  Patient Active Problem List   Diagnosis Date Noted  . Occipital neuralgia of right side 03/24/2020  . Injury of left knee 03/24/2020  . Venous insufficiency of right leg 06/23/2017  . Anxiety and depression 09/11/2016  . Prediabetes 06/21/2016  . Hypothyroidism (acquired) 05/17/2011  . NEPHROLITHIASIS, HX OF 12/03/2007    Current Outpatient Medications on File Prior to Visit  Medication Sig Dispense Refill  . aspirin 81 MG tablet Take 81 mg by mouth daily.      Marland Kitchen escitalopram (LEXAPRO) 10 MG tablet TAKE 1 TABLET BY MOUTH EVERY DAY 90 tablet 1  . levothyroxine (SYNTHROID) 25 MCG tablet TAKE 1 TABLET BY MOUTH  ONCE DAILY BEFORE BREAKFAST 90 tablet 1   No current facility-administered medications on file prior to visit.    Past Medical History:  Diagnosis Date  . Anxiety and depression 09/11/2016  . Borderline diabetes mellitus   . Chest pain   . Elevated TSH 06/23/2017  . GERD (gastroesophageal reflux  disease)   . Grief reaction   . Hyperlipidemia   . Palpitations   . Personal history of urinary calculi   . Prediabetes 06/21/2016  . 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 Subclinical hypothyroidism 05/17/2011  . Venous insufficiency of right leg 06/23/2017   Follows with vascular  . Weakness     Past Surgical History:  Procedure Laterality Date  . CHOLECYSTECTOMY N/A 06/13/2017   Procedure: LAPAROSCOPIC CHOLECYSTECTOMY;  Surgeon: Vickie Epley, MD;  Location: [facility] ORS;  Service: General;  Laterality: N/A;  . COLONOSCOPY    . MASS EXCISION     right upper inner thigh or cyst  . WISDOM TOOTH EXTRACTION      Social History   Socioeconomic History  . Marital status: Widowed    Spouse name: Not on file  . Number of children: 0  . Years of education: Not on file  . Highest education level: Not on file  Occupational History  . Not on file  Tobacco Use  . Smoking status: Never Smoker  . Smokeless tobacco: Never Used  Vaping Use  . Vaping Use: Never used  Substance and Sexual Activity  . Alcohol use: No  . Drug use: No  . Sexual activity: Not on file  Other Topics Concern  . Not on file  Social History Narrative   Retired Merchandiser, retail for [employer]   Married (husband - Eddie Dibbles), no children   Never Smoked    Alcohol use-no      Daily Caffeine Use 2/day   Social Determinants of Health   Financial Resource Strain:   . Difficulty of Paying Living Expenses:   Food Insecurity:   . Worried About Charity fundraiser in the Last Year:   . Arboriculturist in the Last Year:   Transportation Needs:   . Film/video editor (Medical):   Marland Kitchen Lack of Transportation (Non-Medical):   Physical Activity:   . Days of Exercise per Week:   . Minutes of Exercise per Session:   Stress:   . Feeling of Stress :   Social Connections:   . Frequency of Communication with Friends and Family:   . Frequency of Social Gatherings with Friends and  Family:   . Attends Religious Services:   . Active Member of Clubs or Organizations:   . Attends Archivist Meetings:   Marland Kitchen Marital Status:     Family History  Problem Relation Age of Onset  . COPD Mother        deceased 11-16-08  . Alzheimer's disease Father        deceased 2009-09-16  . Coronary artery disease Father        s/p cabg  . Breast cancer Maternal Grandmother   . Colon cancer Neg Hx   . Colon polyps Neg Hx   . Esophageal cancer Neg Hx   . Rectal cancer Neg Hx   . Stomach cancer Neg Hx     Review of Systems  Constitutional: Negative for chills and fever.  HENT: Positive for ear pain. Negative for sore throat.   Musculoskeletal: Negative  for neck pain and neck stiffness.       No left knee swelling or pain  Neurological: Positive for numbness (left knee cap) and headaches.       Objective:   Vitals:   03/24/20 0840  BP: 134/70  Pulse: 62  Temp: 98.1 F (36.7 C)  SpO2: 98%   BP Readings from Last 3 Encounters:  03/24/20 134/70  02/25/20 (!) 151/74  01/01/20 132/80   Wt Readings from Last 3 Encounters:  03/24/20 235 lb (106.6 kg)  02/25/20 240 lb (108.9 kg)  01/01/20 244 lb (110.7 kg)   Body mass index is 35.73 kg/m.   Physical Exam Constitutional:      General: She is not in acute distress.    Appearance: Normal appearance. She is not ill-appearing.  HENT:     Head: Normocephalic and atraumatic.     Right Ear: Tympanic membrane, ear canal and external ear normal.  Musculoskeletal:     Cervical back: Neck supple. No rigidity or tenderness.     Comments: Left knee with minimal effusion, no tenderness, good ROM, decreased sensation anterior knee,  No left calf tenderness.   No posterior neck or trapezius mm tenderness  Lymphadenopathy:     Cervical: No cervical adenopathy.  Skin:    General: Skin is warm and dry.     Findings: No erythema.  Neurological:     General: No focal deficit present.     Mental Status: She is alert and  oriented to person, place, and time.            Assessment & Plan:    See Problem List for Assessment and Plan of chronic medical problems.    This visit occurred during the SARS-CoV-2 public health emergency.  Safety protocols were in place, including screening questions prior to the visit, additional usage of staff PPE, and extensive cleaning of exam room while observing appropriate contact time as indicated for disinfecting solutions.

## 2020-03-24 ENCOUNTER — Encounter: Payer: Self-pay | Admitting: Internal Medicine

## 2020-03-24 ENCOUNTER — Other Ambulatory Visit: Payer: Self-pay

## 2020-03-24 ENCOUNTER — Ambulatory Visit (INDEPENDENT_AMBULATORY_CARE_PROVIDER_SITE_OTHER): Payer: PPO | Admitting: Internal Medicine

## 2020-03-24 VITALS — BP 134/70 | HR 62 | Temp 98.1°F | Ht 68.0 in | Wt 235.0 lb

## 2020-03-24 DIAGNOSIS — F419 Anxiety disorder, unspecified: Secondary | ICD-10-CM | POA: Diagnosis not present

## 2020-03-24 DIAGNOSIS — F329 Major depressive disorder, single episode, unspecified: Secondary | ICD-10-CM | POA: Diagnosis not present

## 2020-03-24 DIAGNOSIS — M5481 Occipital neuralgia: Secondary | ICD-10-CM

## 2020-03-24 DIAGNOSIS — S8992XA Unspecified injury of left lower leg, initial encounter: Secondary | ICD-10-CM | POA: Insufficient documentation

## 2020-03-24 DIAGNOSIS — F32A Depression, unspecified: Secondary | ICD-10-CM

## 2020-03-24 NOTE — Assessment & Plan Note (Signed)
No residual pain or swelling Residual superficial numbness - may or may not improve but may take weeks to get better

## 2020-03-24 NOTE — Assessment & Plan Note (Signed)
Acute Symptoms of right temple pain, ear pain and neck/throat pain that is sharp and transient c/w occipital neuralgia Deferred medication at this time Can try ice, heat posterior neck. advil or tylenol prn Call if no improvement

## 2020-03-24 NOTE — Patient Instructions (Addendum)
Continue to taper off lexparo.  If you can questions please let me know.      Medications reviewed and updated.  Changes include :   none     Occipital Neuralgia  Occipital neuralgia is a type of headache that causes brief episodes of very bad pain in the back of your head. Pain from occipital neuralgia may spread (radiate) to other parts of your head. These headaches may be caused by irritation of the nerves that leave your spinal cord high up in your neck, just below the base of your skull (occipital nerves). Your occipital nerves transmit sensations from the back of your head, the top of your head, and the areas behind your ears. What are the causes? This condition can occur without any known cause (primary headache syndrome). In other cases, this condition is caused by pressure on or irritation of one of the two occipital nerves. Pressure and irritation may be due to:  Muscle spasm in the neck.  Neck injury.  Wear and tear of the vertebrae in the neck (osteoarthritis).  Disease of the disks that separate the vertebrae.  Swollen blood vessels that put pressure on the occipital nerves.  Infections.  Tumors.  Diabetes. What are the signs or symptoms? This condition causes brief burning, stabbing, electric, shocking, or shooting pain which can radiate to the top of the head. It can happen on one side or both sides of the head. It can also cause:  Pain behind the eye.  Pain triggered by neck movement or hair brushing.  Scalp tenderness.  Aching in the back of the head between episodes of very bad pain.  Pain gets worse with exposure to bright lights. How is this diagnosed? There is no test that diagnoses this condition. Your health care provider may diagnose this condition based on a physical exam and your symptoms. Other tests may be done, such as:  Imaging studies of the brain and neck (cervical spine), such as an MRI or CT scan. These look for causes of pinched  nerves.  Applying pressure to the nerves in the neck to try to re-create the pain.  Injection of numbing medicine into the occipital nerve areas to see if pain goes away (diagnostic nerve block). How is this treated? Treatment for this condition may begin with simple measures, such as:  Rest.  Massage.  Applying heat or cold on the area.  Over-the-counter pain relievers. If these measures do not work, you may need other treatments, including:  Medicines, such as: ? Prescription-strength anti-inflammatory medicines. ? Muscle relaxants. ? Anti-seizure medicines, which can relieve pain. ? Antidepressants, which can relieve pain. ? Injected medicines, such as medicines that numb the area (local anesthetic) and steroids.  Pulsed radiofrequency ablation. This is when wires are implanted to deliver electrical impulses that block pain signals from the occipital nerve.  Surgery to relieve nerve pressure.  Physical therapy. Follow these instructions at home: Pain management      Avoid any activities that cause pain.  Rest when you have an attack of pain.  Try gentle massage to relieve pain.  Try a different pillow or sleeping position.  If directed, apply heat to the affected area as told by your health care provider. Use the heat source that your health care provider recommends, such as a moist heat pack or a heating pad. ? Place a towel between your skin and the heat source. ? Leave the heat on for 20-30 minutes. ? Remove the heat if your  skin turns bright red. This is especially important if you are unable to feel pain, heat, or cold. You may have a greater risk of getting burned.  If directed, apply ice to the back of the head and neck area as told by your health care provider. ? Put ice in a plastic bag. ? Place a towel between your skin and the bag. ? Leave the ice on for 20 minutes, 2-3 times per day. General instructions  Take over-the-counter and prescription  medicines only as told by your health care provider.  Avoid things that make your symptoms worse, such as bright lights.  Try to stay active. Get regular exercise that does not cause pain. Ask your health care provider to suggest safe exercises for you.  Work with a physical therapist to learn stretching exercises you can do at home.  Practice good posture.  Keep all follow-up visits as told by your health care provider. This is important. Contact a health care provider if:  Your medicine is not working.  You have new or worsening symptoms. Get help right away if:  You have very bad head pain that does not go away.  You have a sudden change in vision, balance, or speech. Summary  Occipital neuralgia is a type of headache that causes brief episodes of very bad pain in the back of your head.  Pain from occipital neuralgia may spread (radiate) to other parts of your head.  Treatment for this condition includes rest, massage, and medicines. This information is not intended to replace advice given to you by your health care provider. Make sure you discuss any questions you have with your health care provider. Document Revised: 08/29/2017 Document Reviewed: 11/17/2016 Elsevier Patient Education  [place].

## 2020-03-24 NOTE — Assessment & Plan Note (Signed)
Doing well and want to taper off lexparo Will taking 10 mg QOD for 1-2 weeks then stop  Call with questions.

## 2020-04-17 ENCOUNTER — Other Ambulatory Visit: Payer: Self-pay | Admitting: Internal Medicine

## 2020-07-01 ENCOUNTER — Encounter: Payer: PPO | Admitting: Internal Medicine

## 2020-09-08 DIAGNOSIS — Z1231 Encounter for screening mammogram for malignant neoplasm of breast: Secondary | ICD-10-CM | POA: Diagnosis not present

## 2020-09-08 LAB — HM MAMMOGRAPHY

## 2020-09-09 DIAGNOSIS — L570 Actinic keratosis: Secondary | ICD-10-CM | POA: Diagnosis not present

## 2020-09-09 DIAGNOSIS — L820 Inflamed seborrheic keratosis: Secondary | ICD-10-CM | POA: Diagnosis not present

## 2020-09-09 DIAGNOSIS — I872 Venous insufficiency (chronic) (peripheral): Secondary | ICD-10-CM | POA: Diagnosis not present

## 2020-09-09 DIAGNOSIS — L821 Other seborrheic keratosis: Secondary | ICD-10-CM | POA: Diagnosis not present

## 2020-09-09 DIAGNOSIS — I8312 Varicose veins of left lower extremity with inflammation: Secondary | ICD-10-CM | POA: Diagnosis not present

## 2020-09-09 DIAGNOSIS — D225 Melanocytic nevi of trunk: Secondary | ICD-10-CM | POA: Diagnosis not present

## 2020-09-09 DIAGNOSIS — I8311 Varicose veins of right lower extremity with inflammation: Secondary | ICD-10-CM | POA: Diagnosis not present

## 2020-09-09 DIAGNOSIS — Z419 Encounter for procedure for purposes other than remedying health state, unspecified: Secondary | ICD-10-CM | POA: Diagnosis not present

## 2020-09-09 DIAGNOSIS — Z85828 Personal history of other malignant neoplasm of skin: Secondary | ICD-10-CM | POA: Diagnosis not present

## 2020-09-28 ENCOUNTER — Other Ambulatory Visit: Payer: Self-pay

## 2020-09-28 ENCOUNTER — Telehealth (INDEPENDENT_AMBULATORY_CARE_PROVIDER_SITE_OTHER): Payer: PPO | Admitting: Internal Medicine

## 2020-09-28 ENCOUNTER — Encounter: Payer: Self-pay | Admitting: Internal Medicine

## 2020-09-28 DIAGNOSIS — J019 Acute sinusitis, unspecified: Secondary | ICD-10-CM | POA: Diagnosis not present

## 2020-09-28 MED ORDER — AMOXICILLIN-POT CLAVULANATE 875-125 MG PO TABS: 1.0000 | ORAL_TABLET | Freq: Two times a day (BID) | ORAL | 0 refills | Status: DC

## 2020-09-28 NOTE — Progress Notes (Signed)
Virtual Visit via telephone Note  I connected with [name] on 09/28/20 at 10:45 AM EST by  Telephone and verified that I am speaking with the correct person using two identifiers.   I discussed the limitations of evaluation and management by telemedicine and the availability of in person appointments. The patient expressed understanding and agreed to proceed.  Present for the visit:  Myself, Dr Cheryll Cockayne, [name].  The patient is currently at home and I am in the office.    No referring provider.    History of Present Illness: She is here for an acute visit for cold symptoms.   Her symptoms started several days ago.  She feels sick from her jaw up and below the jaw she feels fine.  Her boyfriend was in the ED for symptoms that are worse.  He is covid neg.  She was told it was a common cold.   She states intermittent fever, congestion, ear pain, sinus pain, sore throat, cough which she thinks is related to drainage and headaches.    She has tried taking aspirin, tylenol.  She is drinking a lot of fluids.      Review of Systems  Constitutional: Positive for fever (intermittent).  HENT: Positive for congestion, ear pain, sinus pain and sore throat.   Respiratory: Positive for cough. Negative for shortness of breath and wheezing.   Neurological: Positive for headaches. Negative for dizziness.      Social History   Socioeconomic History  . Marital status: Widowed    Spouse name: Not on file  . Number of children: 0  . Years of education: Not on file  . Highest education level: Not on file  Occupational History  . Not on file  Tobacco Use  . Smoking status: Never Smoker  . Smokeless tobacco: Never Used  Vaping Use  . Vaping Use: Never used  Substance and Sexual Activity  . Alcohol use: No  . Drug use: No  . Sexual activity: Not on file  Other Topics Concern  . Not on file  Social History Narrative   Retired Presenter, broadcasting for  [employer]   Married (husband - Renae Fickle), no children   Never Smoked    Alcohol use-no      Daily Caffeine Use 2/day   Social Determinants of Health   Financial Resource Strain: Not on file  Food Insecurity: Not on file  Transportation Needs: Not on file  Physical Activity: Not on file  Stress: Not on file  Social Connections: Not on file     Observations/Objective: Very hoarse sounding   Assessment and Plan:  See Problem List for Assessment and Plan of chronic medical problems.   Follow Up Instructions:    I discussed the assessment and treatment plan with the patient. The patient was provided an opportunity to ask questions and all were answered. The patient agreed with the plan and demonstrated an understanding of the instructions.   The patient was advised to call back or seek an in-person evaluation if the symptoms worsen or if the condition fails to improve as anticipated.  I spent 7 minutes on this telephone encounter.   Pincus Sanes, MD

## 2020-09-28 NOTE — Assessment & Plan Note (Signed)
Acute Likely bacterial  Start Augmentin 875-125 mg BID x 10 day otc cold medications Rest, fluid Call if no improvement  

## 2020-09-30 ENCOUNTER — Telehealth: Payer: Self-pay | Admitting: Internal Medicine

## 2020-09-30 DIAGNOSIS — R7303 Prediabetes: Secondary | ICD-10-CM

## 2020-09-30 DIAGNOSIS — Z0000 Encounter for general adult medical examination without abnormal findings: Secondary | ICD-10-CM

## 2020-09-30 DIAGNOSIS — E039 Hypothyroidism, unspecified: Secondary | ICD-10-CM

## 2020-09-30 MED ORDER — BENZONATATE 200 MG PO CAPS
200.0000 mg | ORAL_CAPSULE | Freq: Three times a day (TID) | ORAL | 0 refills | Status: DC | PRN
Start: 2020-09-30 — End: 2020-10-15

## 2020-09-30 MED ORDER — DOXYCYCLINE HYCLATE 100 MG PO TABS: 100.0000 mg | ORAL_TABLET | Freq: Two times a day (BID) | ORAL | 0 refills | Status: DC

## 2020-09-30 NOTE — Telephone Encounter (Signed)
Patient calling to let us know that she also is having really bad diarrhea so she isn't sure if she should keep taking the antibiotic

## 2020-09-30 NOTE — Telephone Encounter (Signed)
Patient was seen on 01.03.22 and she is starting to feel better but still doesn't feel great. She has now developed a cough and she was wondering if we could send her something in or if there was something OTC she could take. If we do send something in it cannot have codeine in it patient states.  [id number] - [place], [place] - [address] AT [facility] [address] Phone:  [phone]  Fax:  [phone]

## 2020-09-30 NOTE — Telephone Encounter (Signed)
I will send in cough pills that she can try. If this does not help I would recommend taking delsym syrup or robitussion.    Stop the augmentin and start doxycycline ( sent)

## 2020-10-01 NOTE — Telephone Encounter (Signed)
Spoke with patient today. 

## 2020-10-15 MED ORDER — BENZONATATE 200 MG PO CAPS: 200.0000 mg | ORAL_CAPSULE | Freq: Three times a day (TID) | ORAL | 0 refills | Status: DC | PRN

## 2020-10-15 NOTE — Addendum Note (Signed)
Addended by: Binnie Rail on: 10/15/2020 02:52 PM   Modules accepted: Orders

## 2020-10-15 NOTE — Telephone Encounter (Signed)
benzonatate refilled.  I guess start the doxycycline again - in the future she needs to complete the entire course

## 2020-10-15 NOTE — Addendum Note (Signed)
Addended by: Binnie Rail on: 10/15/2020 03:11 PM   Modules accepted: Orders

## 2020-10-15 NOTE — Telephone Encounter (Signed)
Patient stopped taking her  Doxycycline Hyclate because she was feeling better and now she is feeling sick again and is wondering if she should start taking the doxycycline again and if she can get a refill for the Benzonatate because she was up all night coughing

## 2020-10-16 MED ORDER — DOXYCYCLINE HYCLATE 100 MG PO TABS: 100.0000 mg | ORAL_TABLET | Freq: Two times a day (BID) | ORAL | 0 refills | Status: DC

## 2020-10-16 NOTE — Telephone Encounter (Signed)
If she has some of her doxycycline left she should finish that off.  We do not want to overdo the antibiotics which can cause more harm than good.

## 2020-10-16 NOTE — Addendum Note (Signed)
Addended by: Binnie Rail on: 10/16/2020 04:34 PM   Modules accepted: Orders

## 2020-10-16 NOTE — Telephone Encounter (Signed)
Patient said she went to the pharmacy to pick up her meds. She said that doxycycline (VIBRA-TABS) 100 MG tablet was not there and was wondering if it could be sent in again.    [id number] - [place], [place]

## 2020-10-21 ENCOUNTER — Other Ambulatory Visit (INDEPENDENT_AMBULATORY_CARE_PROVIDER_SITE_OTHER): Payer: PPO

## 2020-10-21 ENCOUNTER — Other Ambulatory Visit: Payer: Self-pay

## 2020-10-21 DIAGNOSIS — E039 Hypothyroidism, unspecified: Secondary | ICD-10-CM

## 2020-10-21 DIAGNOSIS — Z0000 Encounter for general adult medical examination without abnormal findings: Secondary | ICD-10-CM

## 2020-10-21 DIAGNOSIS — R7303 Prediabetes: Secondary | ICD-10-CM

## 2020-10-21 LAB — LIPID PANEL
Cholesterol: 208 mg/dL — ABNORMAL HIGH (ref 0–200)
HDL: 64.6 mg/dL (ref >39.00)
LDL Cholesterol: 125 mg/dL — ABNORMAL HIGH (ref 0–99)
NonHDL: 143.76
Total CHOL/HDL Ratio: 3
Triglycerides: 93 mg/dL (ref 0.0–149.0)
VLDL: 18.6 mg/dL (ref 0.0–40.0)

## 2020-10-21 LAB — CBC WITH DIFFERENTIAL/PLATELET
Basophils Absolute: 0 10*3/uL (ref 0.0–0.1)
Basophils Relative: 0.9 % (ref 0.0–3.0)
Eosinophils Absolute: 0.1 10*3/uL (ref 0.0–0.7)
Eosinophils Relative: 1.6 % (ref 0.0–5.0)
HCT: 38.2 % (ref 36.0–46.0)
Hemoglobin: 13 g/dL (ref 12.0–15.0)
Lymphocytes Relative: 40.6 % (ref 12.0–46.0)
Lymphs Abs: 2.3 10*3/uL (ref 0.7–4.0)
MCHC: 34.1 g/dL (ref 30.0–36.0)
MCV: 88.2 fl (ref 78.0–100.0)
Monocytes Absolute: 0.4 10*3/uL (ref 0.1–1.0)
Monocytes Relative: 7.5 % (ref 3.0–12.0)
Neutro Abs: 2.8 10*3/uL (ref 1.4–7.7)
Neutrophils Relative %: 49.4 % (ref 43.0–77.0)
Platelets: 183 10*3/uL (ref 150.0–400.0)
RBC: 4.33 Mil/uL (ref 3.87–5.11)
RDW: 13.3 % (ref 11.5–15.5)
WBC: 5.6 10*3/uL (ref 4.0–10.5)

## 2020-10-21 LAB — COMPREHENSIVE METABOLIC PANEL
ALT: 18 U/L (ref 0–35)
AST: 17 U/L (ref 0–37)
Albumin: 3.7 g/dL (ref 3.5–5.2)
Alkaline Phosphatase: 60 U/L (ref 39–117)
BUN: 17 mg/dL (ref 6–23)
CO2: 31 mEq/L (ref 19–32)
Calcium: 9.7 mg/dL (ref 8.4–10.5)
Chloride: 101 mEq/L (ref 96–112)
Creatinine, Ser: 0.92 mg/dL (ref 0.40–1.20)
GFR: 61.97 mL/min (ref >60.00)
Glucose, Bld: 143 mg/dL — ABNORMAL HIGH (ref 70–99)
Potassium: 4.7 mEq/L (ref 3.5–5.1)
Sodium: 138 mEq/L (ref 135–145)
Total Bilirubin: 0.5 mg/dL (ref 0.2–1.2)
Total Protein: 7 g/dL (ref 6.0–8.3)

## 2020-10-21 LAB — TSH: TSH: 4.28 u[IU]/mL (ref 0.35–4.50)

## 2020-10-21 LAB — HEMOGLOBIN A1C: Hgb A1c MFr Bld: 5.8 % (ref 4.6–6.5)

## 2020-10-26 DIAGNOSIS — K5730 Diverticulosis of large intestine without perforation or abscess without bleeding: Secondary | ICD-10-CM | POA: Insufficient documentation

## 2020-10-26 DIAGNOSIS — I700 Atherosclerosis of aorta: Secondary | ICD-10-CM | POA: Insufficient documentation

## 2020-10-26 NOTE — Patient Instructions (Addendum)
No immunization administered today.   Medications changes include :   Start omeprazole 20 mg twice daily 30 minutes prior to food.  Once controlled decrease to once a day. Then slowly taper off of it.  Take it as needed after you stop it.   Increase your levothyroxine to 50 mcg daily.   Your prescription(s) have been submitted to your pharmacy.      Please followup in 6 months    Health Maintenance, Female Adopting a healthy lifestyle and getting preventive care are important in promoting health and wellness. Ask your health care provider about:  The right schedule for you to have regular tests and exams.  Things you can do on your own to prevent diseases and keep yourself healthy. What should I know about diet, weight, and exercise? Eat a healthy diet  Eat a diet that includes plenty of vegetables, fruits, low-fat dairy products, and lean protein.  Do not eat a lot of foods that are high in solid fats, added sugars, or sodium.   Maintain a healthy weight Body mass index (BMI) is used to identify weight problems. It estimates body fat based on height and weight. Your health care provider can help determine your BMI and help you achieve or maintain a healthy weight. Get regular exercise Get regular exercise. This is one of the most important things you can do for your health. Most adults should:  Exercise for at least 150 minutes each week. The exercise should increase your heart rate and make you sweat (moderate-intensity exercise).  Do strengthening exercises at least twice a week. This is in addition to the moderate-intensity exercise.  Spend less time sitting. Even light physical activity can be beneficial. Watch cholesterol and blood lipids Have your blood tested for lipids and cholesterol at 73 years of age, then have this test every 5 years. Have your cholesterol levels checked more often if:  Your lipid or cholesterol levels are high.  You are older than 73 years  of age.  You are at high risk for heart disease. What should I know about cancer screening? Depending on your health history and family history, you may need to have cancer screening at various ages. This may include screening for:  Breast cancer.  Cervical cancer.  Colorectal cancer.  Skin cancer.  Lung cancer. What should I know about heart disease, diabetes, and high blood pressure? Blood pressure and heart disease  High blood pressure causes heart disease and increases the risk of stroke. This is more likely to develop in people who have high blood pressure readings, are of African descent, or are overweight.  Have your blood pressure checked: ? Every 3-5 years if you are 29-29 years of age. ? Every year if you are 51 years old or older. Diabetes Have regular diabetes screenings. This checks your fasting blood sugar level. Have the screening done:  Once every three years after age 24 if you are at a normal weight and have a low risk for diabetes.  More often and at a younger age if you are overweight or have a high risk for diabetes. What should I know about preventing infection? Hepatitis B If you have a higher risk for hepatitis B, you should be screened for this virus. Talk with your health care provider to find out if you are at risk for hepatitis B infection. Hepatitis C Testing is recommended for:  Everyone born from [birth year] through 1963.  Anyone with known risk factors for hepatitis C.  Sexually transmitted infections (STIs)  Get screened for STIs, including gonorrhea and chlamydia, if: ? You are sexually active and are younger than 73 years of age. ? You are older than 73 years of age and your health care provider tells you that you are at risk for this type of infection. ? Your sexual activity has changed since you were last screened, and you are at increased risk for chlamydia or gonorrhea. Ask your health care provider if you are at risk.  Ask your health care  provider about whether you are at high risk for HIV. Your health care provider may recommend a prescription medicine to help prevent HIV infection. If you choose to take medicine to prevent HIV, you should first get tested for HIV. You should then be tested every 3 months for as long as you are taking the medicine. Pregnancy  If you are about to stop having your period (premenopausal) and you may become pregnant, seek counseling before you get pregnant.  Take 400 to 800 micrograms (mcg) of folic acid every day if you become pregnant.  Ask for birth control (contraception) if you want to prevent pregnancy. Osteoporosis and menopause Osteoporosis is a disease in which the bones lose minerals and strength with aging. This can result in bone fractures. If you are 10 years old or older, or if you are at risk for osteoporosis and fractures, ask your health care provider if you should:  Be screened for bone loss.  Take a calcium or vitamin D supplement to lower your risk of fractures.  Be given hormone replacement therapy (HRT) to treat symptoms of menopause. Follow these instructions at home: Lifestyle  Do not use any products that contain nicotine or tobacco, such as cigarettes, e-cigarettes, and chewing tobacco. If you need help quitting, ask your health care provider.  Do not use street drugs.  Do not share needles.  Ask your health care provider for help if you need support or information about quitting drugs. Alcohol use  Do not drink alcohol if: ? Your health care provider tells you not to drink. ? You are pregnant, may be pregnant, or are planning to become pregnant.  If you drink alcohol: ? Limit how much you use to 0-1 drink a day. ? Limit intake if you are breastfeeding.  Be aware of how much alcohol is in your drink. In the [place] one drink equals one 12 oz bottle of beer (355 mL), one 5 oz glass of wine (148 mL), or one 1 oz glass of hard liquor (44 mL). General  instructions  Schedule regular health, dental, and eye exams.  Stay current with your vaccines.  Tell your health care provider if: ? You often feel depressed. ? You have ever been abused or do not feel safe at home. Summary  Adopting a healthy lifestyle and getting preventive care are important in promoting health and wellness.  Follow your health care provider's instructions about healthy diet, exercising, and getting tested or screened for diseases.  Follow your health care provider's instructions on monitoring your cholesterol and blood pressure. This information is not intended to replace advice given to you by your health care provider. Make sure you discuss any questions you have with your health care provider. Document Revised: 09/05/2018 Document Reviewed: 09/05/2018 Elsevier Patient Education  2021 [employer].

## 2020-10-26 NOTE — Progress Notes (Signed)
Subjective:    Patient ID: [name], female    DOB: [birth year], 75 y.o.   MRN: [id number]   This visit occurred during the SARS-CoV-2 public health emergency.  Safety protocols were in place, including screening questions prior to the visit, additional usage of staff PPE, and extensive cleaning of exam room while observing appropriate contact time as indicated for disinfecting solutions.    HPI She is here for a physical exam.   She has constant GERD.  She has a history of GERD and was on medication years ago, but has not taken medication on a regular basis for it.  She feels the heartburn on a daily basis.  She has Tums throughout her house and takes that as needed.    She is concerned about her liver.  She was told in the past that she had a fatty liver.  She is concerned about any medication she takes and it having to be metabolized by her liver.    Medications and allergies reviewed with patient and updated if appropriate.  Patient Active Problem List   Diagnosis Date Noted  . Aortic atherosclerosis ([place]) 10/26/2020  . Sigmoid diverticulosis 10/26/2020  . Acute sinus infection 09/28/2020  . Occipital neuralgia of right side 03/24/2020  . Injury of left knee 03/24/2020  . Venous insufficiency of right leg 06/23/2017  . Anxiety and depression 09/11/2016  . Prediabetes 06/21/2016  . Hypothyroidism (acquired) 05/17/2011  . GERD (gastroesophageal reflux disease) 01/17/2009  . NEPHROLITHIASIS, HX OF 12/03/2007    Current Outpatient Medications on File Prior to Visit  Medication Sig Dispense Refill  . aspirin 81 MG tablet Take 81 mg by mouth daily.     No current facility-administered medications on file prior to visit.    Past Medical History:  Diagnosis Date  . Anxiety and depression 09/11/2016  . Borderline diabetes mellitus   . Chest pain   . Elevated TSH 06/23/2017  . GERD (gastroesophageal reflux disease)   . Grief reaction   . Hyperlipidemia    . Palpitations   . Personal history of urinary calculi   . Prediabetes 06/21/2016  . 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 Subclinical hypothyroidism 05/17/2011  . Venous insufficiency of right leg 06/23/2017   Follows with vascular  . Weakness     Past Surgical History:  Procedure Laterality Date  . CHOLECYSTECTOMY N/A 06/13/2017   Procedure: LAPAROSCOPIC CHOLECYSTECTOMY;  Surgeon: Vickie Epley, MD;  Location: [facility] ORS;  Service: General;  Laterality: N/A;  . COLONOSCOPY    . MASS EXCISION     right upper inner thigh or cyst  . WISDOM TOOTH EXTRACTION      Social History   Socioeconomic History  . Marital status: Widowed    Spouse name: Not on file  . Number of children: 0  . Years of education: Not on file  . Highest education level: Not on file  Occupational History  . Not on file  Tobacco Use  . Smoking status: Never Smoker  . Smokeless tobacco: Never Used  Vaping Use  . Vaping Use: Never used  Substance and Sexual Activity  . Alcohol use: No  . Drug use: No  . Sexual activity: Not on file  Other Topics Concern  . Not on file  Social History Narrative   Retired Merchandiser, retail for [employer]   Married (husband - Eddie Dibbles), no children   Never Smoked  Alcohol use-no      Daily Caffeine Use 2/day   Social Determinants of Health   Financial Resource Strain: Not on file  Food Insecurity: Not on file  Transportation Needs: Not on file  Physical Activity: Not on file  Stress: Not on file  Social Connections: Not on file    Family History  Problem Relation Age of Onset  . COPD Mother        deceased 10-16-2008  . Alzheimer's disease Father        deceased 2009/08/16  . Coronary artery disease Father        s/p cabg  . Breast cancer Maternal Grandmother   . Colon cancer Neg Hx   . Colon polyps Neg Hx   . Esophageal cancer Neg Hx   . Rectal cancer Neg Hx   . Stomach cancer Neg Hx     Review of Systems   Constitutional: Negative for chills and fever.  Eyes: Negative for visual disturbance.  Respiratory: Positive for cough (residual from sinus infection). Negative for shortness of breath and wheezing.   Cardiovascular: Positive for palpitations. Negative for chest pain and leg swelling.  Gastrointestinal: Negative for abdominal pain, blood in stool, constipation, diarrhea and nausea.  Genitourinary: Negative for dysuria.  Musculoskeletal: Negative for arthralgias and back pain.  Skin: Negative for rash.  Neurological: Negative for light-headedness and headaches.  Psychiatric/Behavioral: Negative for dysphoric mood. The patient is nervous/anxious.        Objective:   Vitals:   10/27/20 0803  BP: 130/80  Pulse: 70  Temp: 98.2 F (36.8 C)  SpO2: 97%   Filed Weights   10/27/20 0803  Weight: 240 lb (108.9 kg)   Body mass index is 36.49 kg/m.  BP Readings from Last 3 Encounters:  10/27/20 130/80  03/24/20 134/70  02/25/20 (!) 151/74    Wt Readings from Last 3 Encounters:  10/27/20 240 lb (108.9 kg)  03/24/20 235 lb (106.6 kg)  02/25/20 240 lb (108.9 kg)     Physical Exam Constitutional: She appears well-developed and well-nourished. No distress.  HENT:  Head: Normocephalic and atraumatic.  Right Ear: External ear normal. Normal ear canal and TM Left Ear: External ear normal.  Normal ear canal and TM Mouth/Throat: Oropharynx is clear and moist.  Eyes: Conjunctivae and EOM are normal.  Neck: Neck supple. No tracheal deviation present. No thyromegaly present.  No carotid bruit  Cardiovascular: Normal rate, regular rhythm and normal heart sounds.   No murmur heard.  No edema. Pulmonary/Chest: Effort normal and breath sounds normal. No respiratory distress. She has no wheezes. She has no rales.  Breast: deferred   Abdominal: Soft. She exhibits no distension. There is no tenderness.  Lymphadenopathy: She has no cervical adenopathy.  Skin: Skin is warm and dry. She is  not diaphoretic.  Psychiatric: She has a normal mood and affect. Her behavior is normal.   The 10-year ASCVD risk score Denman George DC Jr., et al., 2013) is: 13.4%   Values used to calculate the score:     Age: 75 years     Sex: Female     Is Non-Hispanic African American: No     Diabetic: No     Tobacco smoker: No     Systolic Blood Pressure: 130 mmHg     Is BP treated: No     HDL Cholesterol: 64.6 mg/dL     Total Cholesterol: 208 mg/dL    Lab Results  Component Value Date   WBC 5.6  10/21/2020   HGB 13.0 10/21/2020   HCT 38.2 10/21/2020   PLT 183.0 10/21/2020   GLUCOSE 143 (H) 10/21/2020   CHOL 208 (H) 10/21/2020   TRIG 93.0 10/21/2020   HDL 64.60 10/21/2020   LDLDIRECT 152.1 06/26/2013   LDLCALC 125 (H) 10/21/2020   ALT 18 10/21/2020   AST 17 10/21/2020   NA 138 10/21/2020   K 4.7 10/21/2020   CL 101 10/21/2020   CREATININE 0.92 10/21/2020   BUN 17 10/21/2020   CO2 31 10/21/2020   TSH 4.28 10/21/2020   HGBA1C 5.8 10/21/2020   MICROALBUR 0.9 06/26/2013        Assessment & Plan:   Physical exam: Screening blood work    reviewed Immunizations  advised Flu vac, had covid vacs - will get booster, discussed tdap and shingrix Colonoscopy  Up to date  Mammogram  Up to date  Gyn  N/a  Dexa  Up to date  Eye exams   Up to date  Exercise  none Weight  Advised weight loss Substance abuse  none Sees derm annually     See Problem List for Assessment and Plan of chronic medical problems.   Follow-up in 6 months

## 2020-10-27 ENCOUNTER — Encounter: Payer: Self-pay | Admitting: Internal Medicine

## 2020-10-27 ENCOUNTER — Other Ambulatory Visit: Payer: Self-pay

## 2020-10-27 ENCOUNTER — Ambulatory Visit (INDEPENDENT_AMBULATORY_CARE_PROVIDER_SITE_OTHER): Payer: PPO | Admitting: Internal Medicine

## 2020-10-27 VITALS — BP 130/80 | HR 70 | Temp 98.2°F | Ht 68.0 in | Wt 240.0 lb

## 2020-10-27 DIAGNOSIS — E039 Hypothyroidism, unspecified: Secondary | ICD-10-CM

## 2020-10-27 DIAGNOSIS — I700 Atherosclerosis of aorta: Secondary | ICD-10-CM

## 2020-10-27 DIAGNOSIS — F32A Depression, unspecified: Secondary | ICD-10-CM

## 2020-10-27 DIAGNOSIS — K219 Gastro-esophageal reflux disease without esophagitis: Secondary | ICD-10-CM | POA: Diagnosis not present

## 2020-10-27 DIAGNOSIS — F419 Anxiety disorder, unspecified: Secondary | ICD-10-CM

## 2020-10-27 DIAGNOSIS — R7303 Prediabetes: Secondary | ICD-10-CM

## 2020-10-27 DIAGNOSIS — Z0001 Encounter for general adult medical examination with abnormal findings: Secondary | ICD-10-CM | POA: Diagnosis not present

## 2020-10-27 DIAGNOSIS — Z0000 Encounter for general adult medical examination without abnormal findings: Secondary | ICD-10-CM

## 2020-10-27 MED ORDER — LEVOTHYROXINE SODIUM 50 MCG PO TABS: 50.0000 ug | ORAL_TABLET | Freq: Every day | ORAL | 3 refills | Status: DC

## 2020-10-27 MED ORDER — OMEPRAZOLE 20 MG PO CPDR: 20.0000 mg | DELAYED_RELEASE_CAPSULE | Freq: Two times a day (BID) | ORAL | 5 refills | Status: DC

## 2020-10-27 NOTE — Assessment & Plan Note (Signed)
Chronic Check a1c Low sugar / carb diet Stressed regular exercise Encouraged weight loss 

## 2020-10-27 NOTE — Assessment & Plan Note (Addendum)
Chronic  Clinically euthyroid Currently taking levothyroxine  25 mcg daily  Lab Results  Component Value Date   TSH 4.28 10/21/2020   Will increase levothyroxine to 50 mcg daily

## 2020-10-27 NOTE — Assessment & Plan Note (Addendum)
Chronic Deferred statin-has not tolerated more than 1 in the past Will working on eating a healthy diet  Start exercising regularly Work on weight loss

## 2020-10-27 NOTE — Assessment & Plan Note (Addendum)
Acute Has had GERD in the past, but now having it daily Taking Tums daily Start omeprazole 20 mg BID AC until well controlled - then decrease to once daily for a while and then slowly taper off.  Advised okay to take this in the future as needed Reviewed GERD diet, importance of weight loss and avoiding laying down within 3 hours of eating

## 2020-10-27 NOTE — Assessment & Plan Note (Addendum)
Chronic Stopped lexapro - did not feel like herself Doing ok, but does have some anxiety Does not want to take any medication Will monitor

## 2020-10-30 ENCOUNTER — Encounter: Payer: Self-pay | Admitting: Internal Medicine

## 2020-10-30 NOTE — Progress Notes (Signed)
Outside notes received. Information abstracted. Notes sent to scan.  

## 2020-11-24 ENCOUNTER — Telehealth: Payer: Self-pay | Admitting: Internal Medicine

## 2020-11-24 NOTE — Telephone Encounter (Signed)
Patient called and was wondering how she needed to take levothyroxine (SYNTHROID) 50 MCG tablet. She said that she spoke with Dr. Quay Burow mentioned doubling her dosage. She was wondering if a new prescription could be sent to [place], [place]

## 2020-11-27 NOTE — Telephone Encounter (Signed)
Patient calling for status of refill. She has no medication,   Please send levothyroxine (SYNTHROID) 50 MCG tablet to pharmacy  Patient requesting call

## 2020-11-27 NOTE — Telephone Encounter (Signed)
Spoke with patient today. 

## 2021-03-10 ENCOUNTER — Encounter: Payer: Self-pay | Admitting: Internal Medicine

## 2021-03-15 ENCOUNTER — Telehealth: Payer: Self-pay | Admitting: Internal Medicine

## 2021-03-15 DIAGNOSIS — I700 Atherosclerosis of aorta: Secondary | ICD-10-CM

## 2021-03-15 DIAGNOSIS — R7303 Prediabetes: Secondary | ICD-10-CM

## 2021-03-15 DIAGNOSIS — E039 Hypothyroidism, unspecified: Secondary | ICD-10-CM

## 2021-03-15 NOTE — Telephone Encounter (Signed)
ordered

## 2021-03-15 NOTE — Telephone Encounter (Signed)
   Patient requesting order for labs prior to her 6 month follow in August 2. She would like to have results to discuss during visit

## 2021-03-16 NOTE — Telephone Encounter (Signed)
Called and left a vm that future labs have been placed for the pt.

## 2021-03-23 ENCOUNTER — Other Ambulatory Visit: Payer: Self-pay

## 2021-03-23 ENCOUNTER — Ambulatory Visit (INDEPENDENT_AMBULATORY_CARE_PROVIDER_SITE_OTHER): Payer: PPO

## 2021-03-23 VITALS — BP 120/70 | HR 78 | Temp 97.6°F | Ht 68.0 in | Wt 248.4 lb

## 2021-03-23 DIAGNOSIS — Z0000 Encounter for general adult medical examination without abnormal findings: Secondary | ICD-10-CM

## 2021-03-23 NOTE — Progress Notes (Signed)
Subjective:   Tiney Zipper [name] is a 75 y.o. female who presents for Medicare Annual (Subsequent) preventive examination.  Review of Systems     Cardiac Risk Factors include: advanced age (>2men, >50 women);family history of premature cardiovascular disease;obesity (BMI >30kg/m2);sedentary lifestyle     Objective:    Today's Vitals   03/23/21 1400  BP: 120/70  Pulse: 78  Temp: 97.6 F (36.4 C)  SpO2: 98%  Weight: 248 lb 6.4 oz (112.7 kg)  Height: 5\' 8"  (1.727 m)  PainSc: 0-No pain   Body mass index is 37.77 kg/m.  Advanced Directives 03/23/2021 02/25/2020 01/28/2020 06/13/2017  Does Patient Have a Medical Advance Directive? No No Yes Yes  Type of Advance Directive - Public librarian;Living will Living will;Healthcare Power of Attorney  Does patient want to make changes to medical advance directive? - - No - Patient declined No - Patient declined  Copy of [place] in Chart? - - No - copy requested No - copy requested  Would patient like information on creating a medical advance directive? No - Patient declined No - Patient declined - -    Current Medications (verified) Outpatient Encounter Medications as of 03/23/2021  Medication Sig   aspirin 81 MG tablet Take 81 mg by mouth daily.   levothyroxine (SYNTHROID) 50 MCG tablet Take 1 tablet (50 mcg total) by mouth daily.   omeprazole (PRILOSEC) 20 MG capsule Take 1 capsule (20 mg total) by mouth 2 (two) times daily before a meal.   No facility-administered encounter medications on file as of 03/23/2021.    Allergies (verified) Atorvastatin and Augmentin [amoxicillin-pot clavulanate]   History: 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 ( Dr Tonia Brooms)   Subclinical hypothyroidism 05/17/2011   Venous insufficiency of right leg 06/23/2017   Follows with vascular   Weakness    Past Surgical History:  Procedure Laterality Date   CHOLECYSTECTOMY N/A 06/13/2017   Procedure: LAPAROSCOPIC CHOLECYSTECTOMY;  Surgeon: Vickie Epley, MD;  Location: [facility] ORS;  Service: General;  Laterality: N/A;   COLONOSCOPY     MASS EXCISION     right upper inner thigh or cyst   WISDOM TOOTH EXTRACTION     Family History  Problem Relation Age of Onset   COPD Mother        deceased 10/21/08   Alzheimer's disease Father        deceased 21-Aug-2009   Coronary artery disease Father        s/p cabg   Breast cancer Maternal Grandmother    Colon cancer Neg Hx    Colon polyps Neg Hx    Esophageal cancer Neg Hx    Rectal cancer Neg Hx    Stomach cancer Neg Hx    Social History   Socioeconomic History   Marital status: Widowed    Spouse name: Not on file   Number of children: 0   Years of education: 16   Highest education level: 12th grade  Occupational History   Not on file  Tobacco Use   Smoking status: Never   Smokeless tobacco:  Never  Vaping Use   Vaping Use: Never used  Substance and Sexual Activity   Alcohol use: No   Drug use: No   Sexual activity: Not on file  Other Topics Concern   Not on file  Social History Narrative   Retired Merchandiser, retail for [employer]   Widowed (husband - Eddie Dibbles), no children   Never Smoked    Alcohol use-no      Daily Caffeine Use 2/day   Social Determinants of Radio broadcast assistant Strain: Low Risk    Difficulty of Paying Living Expenses: Not hard at all  Food Insecurity: No Food Insecurity   Worried About Charity fundraiser in the Last Year: Never true   Arboriculturist in the Last Year: Never true  Transportation Needs: No Transportation Needs   Lack of Transportation (Medical): No   Lack of  Transportation (Non-Medical): No  Physical Activity: Insufficiently Active   Days of Exercise per Week: 3 days   Minutes of Exercise per Session: 30 min  Stress: No Stress Concern Present   Feeling of Stress : Not at all  Social Connections: Moderately Integrated   Frequency of Communication with Friends and Family: More than three times a week   Frequency of Social Gatherings with Friends and Family: More than three times a week   Attends Religious Services: More than 4 times per year   Active Member of [employer] or Organizations: Yes   Attends Archivist Meetings: More than 4 times per year   Marital Status: Widowed    Tobacco Counseling Counseling given: Not Answered   Clinical Intake:  Pre-visit preparation completed: Yes  Pain : No/denies pain Pain Score: 0-No pain     BMI - recorded: 37.77 Nutritional Status: BMI > 30  Obese Nutritional Risks: Nausea/ vomitting/ diarrhea Diabetes: No  How often do you need to have someone help you when you read instructions, pamphlets, or other written materials from your doctor or pharmacy?: 1 - Never What is the last grade level you completed in school?: High School Graduate  Diabetic? no  Interpreter Needed?: No  Information entered by :: Lisette Abu, LPN   Activities of Daily Living In your present state of health, do you have any difficulty performing the following activities: 03/23/2021  Hearing? N  Vision? N  Difficulty concentrating or making decisions? N  Walking or climbing stairs? N  Dressing or bathing? N  Doing errands, shopping? N  Preparing Food and eating ? N  Using the Toilet? N  In the past six months, have you accidently leaked urine? N  Do you have problems with loss of bowel control? N  Managing your Medications? N  Managing your Finances? N  Housekeeping or managing your Housekeeping? N  Some recent data might be hidden    Patient Care Team: Binnie Rail, MD as PCP - General  (Internal Medicine) Vevelyn Royals, MD as Consulting Physician (Ophthalmology) Lorelle Gibbs, MD (Radiology)  Indicate any recent Medical Services you may have received from other than [facility] providers in the past year (date may be approximate).     Assessment:   This is a routine wellness examination for Teneka.  Hearing/Vision screen No results found.  Dietary issues and exercise activities discussed: Current Exercise Habits: Home exercise routine, Type of exercise: walking, Time (Minutes): 30, Frequency (Times/Week): 3, Weekly Exercise (Minutes/Week): 90, Intensity: Mild, Exercise limited by: orthopedic condition(s)   Goals Addressed  This Visit's Progress     Patient Stated (pt-stated)        My goal is to lose 50 pounds by the end of the year. By changing diet and increasing activity.       Depression Screen PHQ 2/9 Scores 03/23/2021 04/15/2020 01/28/2020 01/01/2020 07/01/2019 07/01/2019 06/23/2017  PHQ - 2 Score 0 0 0 - 0 0 2  PHQ- 9 Score - 0 - - 1 - -  Exception Documentation - - - Patient refusal - - -    Fall Risk Fall Risk  03/23/2021 01/28/2020 01/01/2020 06/23/2017 06/21/2016  Falls in the past year? 1 0 0 No No  Number falls in past yr: 0 0 0 - -  Injury with Fall? 0 0 0 - -  Risk for fall due to : Orthopedic patient No Fall Risks - - -  Follow up Falls evaluation completed Falls evaluation completed;Education provided;Falls prevention discussed Falls evaluation completed - -    FALL RISK PREVENTION PERTAINING TO THE HOME:  Any stairs in or around the home? No  If so, are there any without handrails? No  Home free of loose throw rugs in walkways, pet beds, electrical cords, etc? Yes  Adequate lighting in your home to reduce risk of falls? Yes   ASSISTIVE DEVICES UTILIZED TO PREVENT FALLS:  Life alert? No  Use of a cane, walker or w/c? No  Grab bars in the bathroom? No  Shower chair or bench in shower? Yes  Elevated toilet seat or a  handicapped toilet? Yes   TIMED UP AND GO:  Was the test performed? Yes .  Length of time to ambulate 10 feet: 6 sec.   Gait steady and fast without use of assistive device  Cognitive Function: Normal cognitive status assessed by direct observation by this Nurse Health Advisor. No abnormalities found.       6CIT Screen 01/28/2020  What Year? 0 points  What month? 0 points  What time? 0 points  Count back from 20 0 points  Months in reverse 0 points  Repeat phrase 0 points  Total Score 0    Immunizations Immunization History  Administered Date(s) Administered   Influenza Whole 06/26/2008, 07/02/2009   Influenza, High Dose Seasonal PF 06/23/2017, 07/26/2018, 06/28/2019   Influenza-Unspecified 06/07/2016   PFIZER(Purple Top)SARS-COV-2 Vaccination 05/21/2020, 06/11/2020   Pneumococcal Conjugate-13 06/23/2017   Pneumococcal Polysaccharide-23 07/09/2013    TDAP status: Due, Education has been provided regarding the importance of this vaccine. Advised may receive this vaccine at local pharmacy or Health Dept. Aware to provide a copy of the vaccination record if obtained from local pharmacy or Health Dept. Verbalized acceptance and understanding.  Flu Vaccine status: Due, Education has been provided regarding the importance of this vaccine. Advised may receive this vaccine at local pharmacy or Health Dept. Aware to provide a copy of the vaccination record if obtained from local pharmacy or Health Dept. Verbalized acceptance and understanding.  Pneumococcal vaccine status: Up to date  Covid-19 vaccine status: Completed vaccines  Qualifies for Shingles Vaccine? Yes   Zostavax completed No   Shingrix Completed?: No.    Education has been provided regarding the importance of this vaccine. Patient has been advised to call insurance company to determine out of pocket expense if they have not yet received this vaccine. Advised may also receive vaccine at local pharmacy or Health Dept.  Verbalized acceptance and understanding.  Screening Tests Health Maintenance  Topic Date Due   Zoster Vaccines- Shingrix (1  of 2) Never done   COVID-19 Vaccine (3 - Booster for Pfizer series) 11/11/2020   TETANUS/TDAP  10/27/2021 (Originally 10/01/1964)   INFLUENZA VACCINE  04/26/2021   MAMMOGRAM  09/08/2022   DEXA SCAN  07/26/2023   COLONOSCOPY (Pts 45-34yrs Insurance coverage will need to be confirmed)  06/05/2029   Hepatitis C Screening  Completed   PNA vac Low Risk Adult  Completed   HPV VACCINES  Aged Out    Health Maintenance  Health Maintenance Due  Topic Date Due   Zoster Vaccines- Shingrix (1 of 2) Never done   COVID-19 Vaccine (3 - Booster for Pfizer series) 11/11/2020    Colorectal cancer screening: Type of screening: Colonoscopy. Completed 06/06/2019. Repeat every 10 years  Mammogram status: Completed 09/08/2020. Repeat every year  Bone Density status: Completed 07/25/2018. Results reflect: Bone density results: NORMAL. Repeat every 5 years.  Lung Cancer Screening: (Low Dose CT Chest recommended if Age 68-80 years, 30 pack-year currently smoking OR have quit w/in 15years.) does not qualify.   Lung Cancer Screening Referral: no  Additional Screening:  Hepatitis C Screening: does qualify; Completed yes  Vision Screening: Recommended annual ophthalmology exams for early detection of glaucoma and other disorders of the eye. Is the patient up to date with their annual eye exam?  Yes  Who is the provider or what is the name of the office in which the patient attends annual eye exams? Vevelyn Royals, MD. If pt is not established with a provider, would they like to be referred to a provider to establish care? No .   Dental Screening: Recommended annual dental exams for proper oral hygiene  Community Resource Referral / Chronic Care Management: CRR required this visit?  No   CCM required this visit?  No      Plan:     I have personally reviewed and noted the  following in the patient's chart:   Medical and social history Use of alcohol, tobacco or illicit drugs  Current medications and supplements including opioid prescriptions.  Functional ability and status Nutritional status Physical activity Advanced directives List of other physicians Hospitalizations, surgeries, and ER visits in previous 12 months Vitals Screenings to include cognitive, depression, and falls Referrals and appointments  In addition, I have reviewed and discussed with patient certain preventive protocols, quality metrics, and best practice recommendations. A written personalized care plan for preventive services as well as general preventive health recommendations were provided to patient.     Sheral Flow, LPN   03/14/5092   Nurse Notes: n/a

## 2021-03-23 NOTE — Patient Instructions (Signed)
[name] , Thank you for taking time to come for your Medicare Wellness Visit. I appreciate your ongoing commitment to your health goals. Please review the following plan we discussed and let me know if I can assist you in the future.   Screening recommendations/referrals: Colonoscopy: 06/06/2019; due every 10 years (due 2030) Mammogram: 09/08/2020; due every 1 year (due 2022) Bone Density: 07/25/2018; due every 5 years (due 2024) Recommended yearly ophthalmology/optometry visit for glaucoma screening and checkup Recommended yearly dental visit for hygiene and checkup  Vaccinations: Influenza vaccine: due Fall 2022 Pneumococcal vaccine: 07/09/2013, 06/23/2017 Tdap vaccine: never done Shingles vaccine: never done   Covid-19: 05/21/2020, 06/11/2020  Advanced directives: Advance directive discussed with you today. Even though you declined this today please call our office should you change your mind and we can give you the proper paperwork for you to fill out.  Conditions/risks identified: My goal is to lose 50 pounds by the end of the year. By changing diet and increasing activity.  Next appointment: Please schedule your next Medicare Wellness Visit with your Nurse Health Advisor in 1 year by calling [phone].   Preventive Care 26 Years and Older, Female Preventive care refers to lifestyle choices and visits with your health care provider that can promote health and wellness. What does preventive care include? A yearly physical exam. This is also called an annual well check. Dental exams once or twice a year. Routine eye exams. Ask your health care provider how often you should have your eyes checked. Personal lifestyle choices, including: Daily care of your teeth and gums. Regular physical activity. Eating a healthy diet. Avoiding tobacco and drug use. Limiting alcohol use. Practicing safe sex. Taking low-dose aspirin every day. Taking vitamin and mineral supplements  as recommended by your health care provider. What happens during an annual well check? The services and screenings done by your health care provider during your annual well check will depend on your age, overall health, lifestyle risk factors, and family history of disease. Counseling  Your health care provider may ask you questions about your: Alcohol use. Tobacco use. Drug use. Emotional well-being. Home and relationship well-being. Sexual activity. Eating habits. History of falls. Memory and ability to understand (cognition). Work and work Statistician. Reproductive health. Screening  You may have the following tests or measurements: Height, weight, and BMI. Blood pressure. Lipid and cholesterol levels. These may be checked every 5 years, or more frequently if you are over 4 years old. Skin check. Lung cancer screening. You may have this screening every year starting at age 40 if you have a 30-pack-year history of smoking and currently smoke or have quit within the past 15 years. Fecal occult blood test (FOBT) of the stool. You may have this test every year starting at age 49. Flexible sigmoidoscopy or colonoscopy. You may have a sigmoidoscopy every 5 years or a colonoscopy every 10 years starting at age 35. Hepatitis C blood test. Hepatitis B blood test. Sexually transmitted disease (STD) testing. Diabetes screening. This is done by checking your blood sugar (glucose) after you have not eaten for a while (fasting). You may have this done every 1-3 years. Bone density scan. This is done to screen for osteoporosis. You may have this done starting at age 38. Mammogram. This may be done every 1-2 years. Talk to your health care provider about how often you should have regular mammograms. Talk with your health care provider about your test results, treatment options, and if necessary, the  need for more tests. Vaccines  Your health care provider may recommend certain vaccines, such  as: Influenza vaccine. This is recommended every year. Tetanus, diphtheria, and acellular pertussis (Tdap, Td) vaccine. You may need a Td booster every 10 years. Zoster vaccine. You may need this after age 34. Pneumococcal 13-valent conjugate (PCV13) vaccine. One dose is recommended after age 87. Pneumococcal polysaccharide (PPSV23) vaccine. One dose is recommended after age 55. Talk to your health care provider about which screenings and vaccines you need and how often you need them. This information is not intended to replace advice given to you by your health care provider. Make sure you discuss any questions you have with your health care provider. Document Released: 10/09/2015 Document Revised: 06/01/2016 Document Reviewed: 07/14/2015 Elsevier Interactive Patient Education  2017 [place] Prevention in the Home Falls can cause injuries. They can happen to people of all ages. There are many things you can do to make your home safe and to help prevent falls. What can I do on the outside of my home? Regularly fix the edges of walkways and driveways and fix any cracks. Remove anything that might make you trip as you walk through a door, such as a raised step or threshold. Trim any bushes or trees on the path to your home. Use bright outdoor lighting. Clear any walking paths of anything that might make someone trip, such as rocks or tools. Regularly check to see if handrails are loose or broken. Make sure that both sides of any steps have handrails. Any raised decks and porches should have guardrails on the edges. Have any leaves, snow, or ice cleared regularly. Use sand or salt on walking paths during winter. Clean up any spills in your garage right away. This includes oil or grease spills. What can I do in the bathroom? Use night lights. Install grab bars by the toilet and in the tub and shower. Do not use towel bars as grab bars. Use non-skid mats or decals in the tub or  shower. If you need to sit down in the shower, use a plastic, non-slip stool. Keep the floor dry. Clean up any water that spills on the floor as soon as it happens. Remove soap buildup in the tub or shower regularly. Attach bath mats securely with double-sided non-slip rug tape. Do not have throw rugs and other things on the floor that can make you trip. What can I do in the bedroom? Use night lights. Make sure that you have a light by your bed that is easy to reach. Do not use any sheets or blankets that are too big for your bed. They should not hang down onto the floor. Have a firm chair that has side arms. You can use this for support while you get dressed. Do not have throw rugs and other things on the floor that can make you trip. What can I do in the kitchen? Clean up any spills right away. Avoid walking on wet floors. Keep items that you use a lot in easy-to-reach places. If you need to reach something above you, use a strong step stool that has a grab bar. Keep electrical cords out of the way. Do not use floor polish or wax that makes floors slippery. If you must use wax, use non-skid floor wax. Do not have throw rugs and other things on the floor that can make you trip. What can I do with my stairs? Do not leave any items on the  stairs. Make sure that there are handrails on both sides of the stairs and use them. Fix handrails that are broken or loose. Make sure that handrails are as long as the stairways. Check any carpeting to make sure that it is firmly attached to the stairs. Fix any carpet that is loose or worn. Avoid having throw rugs at the top or bottom of the stairs. If you do have throw rugs, attach them to the floor with carpet tape. Make sure that you have a light switch at the top of the stairs and the bottom of the stairs. If you do not have them, ask someone to add them for you. What else can I do to help prevent falls? Wear shoes that: Do not have high heels. Have  rubber bottoms. Are comfortable and fit you well. Are closed at the toe. Do not wear sandals. If you use a stepladder: Make sure that it is fully opened. Do not climb a closed stepladder. Make sure that both sides of the stepladder are locked into place. Ask someone to hold it for you, if possible. Clearly mark and make sure that you can see: Any grab bars or handrails. First and last steps. Where the edge of each step is. Use tools that help you move around (mobility aids) if they are needed. These include: Canes. Walkers. Scooters. Crutches. Turn on the lights when you go into a dark area. Replace any light bulbs as soon as they burn out. Set up your furniture so you have a clear path. Avoid moving your furniture around. If any of your floors are uneven, fix them. If there are any pets around you, be aware of where they are. Review your medicines with your doctor. Some medicines can make you feel dizzy. This can increase your chance of falling. Ask your doctor what other things that you can do to help prevent falls. This information is not intended to replace advice given to you by your health care provider. Make sure you discuss any questions you have with your health care provider. Document Released: 07/09/2009 Document Revised: 02/18/2016 Document Reviewed: 10/17/2014 Elsevier Interactive Patient Education  2017 [employer]1

## 2021-04-16 ENCOUNTER — Other Ambulatory Visit (INDEPENDENT_AMBULATORY_CARE_PROVIDER_SITE_OTHER): Payer: PPO

## 2021-04-16 DIAGNOSIS — I700 Atherosclerosis of aorta: Secondary | ICD-10-CM

## 2021-04-16 DIAGNOSIS — R7303 Prediabetes: Secondary | ICD-10-CM

## 2021-04-16 DIAGNOSIS — E039 Hypothyroidism, unspecified: Secondary | ICD-10-CM | POA: Diagnosis not present

## 2021-04-16 LAB — COMPREHENSIVE METABOLIC PANEL
ALT: 18 U/L (ref 0–35)
AST: 18 U/L (ref 0–37)
Albumin: 4 g/dL (ref 3.5–5.2)
Alkaline Phosphatase: 64 U/L (ref 39–117)
BUN: 24 mg/dL — ABNORMAL HIGH (ref 6–23)
CO2: 29 mEq/L (ref 19–32)
Calcium: 9.8 mg/dL (ref 8.4–10.5)
Chloride: 103 mEq/L (ref 96–112)
Creatinine, Ser: 0.98 mg/dL (ref 0.40–1.20)
GFR: 57.25 mL/min — ABNORMAL LOW (ref >60.00)
Glucose, Bld: 114 mg/dL — ABNORMAL HIGH (ref 70–99)
Potassium: 5.1 mEq/L (ref 3.5–5.1)
Sodium: 139 mEq/L (ref 135–145)
Total Bilirubin: 0.5 mg/dL (ref 0.2–1.2)
Total Protein: 7.1 g/dL (ref 6.0–8.3)

## 2021-04-16 LAB — TSH: TSH: 2.81 u[IU]/mL (ref 0.35–5.50)

## 2021-04-16 LAB — LIPID PANEL
Cholesterol: 221 mg/dL — ABNORMAL HIGH (ref 0–200)
HDL: 65 mg/dL (ref >39.00)
LDL Cholesterol: 135 mg/dL — ABNORMAL HIGH (ref 0–99)
NonHDL: 155.85
Total CHOL/HDL Ratio: 3
Triglycerides: 102 mg/dL (ref 0.0–149.0)
VLDL: 20.4 mg/dL (ref 0.0–40.0)

## 2021-04-16 LAB — HEMOGLOBIN A1C: Hgb A1c MFr Bld: 5.8 % (ref 4.6–6.5)

## 2021-04-21 ENCOUNTER — Encounter: Payer: Self-pay | Admitting: Internal Medicine

## 2021-04-21 ENCOUNTER — Telehealth (INDEPENDENT_AMBULATORY_CARE_PROVIDER_SITE_OTHER): Payer: PPO | Admitting: Internal Medicine

## 2021-04-21 ENCOUNTER — Telehealth: Payer: Self-pay | Admitting: Internal Medicine

## 2021-04-21 DIAGNOSIS — U071 COVID-19: Secondary | ICD-10-CM

## 2021-04-21 MED ORDER — MOLNUPIRAVIR 200 MG PO CAPS: 800.0000 mg | ORAL_CAPSULE | Freq: Two times a day (BID) | ORAL | 0 refills | Status: AC

## 2021-04-21 MED ORDER — BENZONATATE 200 MG PO CAPS: 200.0000 mg | ORAL_CAPSULE | Freq: Three times a day (TID) | ORAL | 1 refills | Status: DC | PRN

## 2021-04-21 NOTE — Telephone Encounter (Signed)
Dr. Quay Burow will do telephone call today with patient. Patient has been informed.

## 2021-04-21 NOTE — Progress Notes (Signed)
Virtual Visit via telephone Note  I connected with [name] on 04/21/21 at 11:20 AM EDT by telephone and verified that I am speaking with the correct person using two identifiers.   I discussed the limitations of evaluation and management by telemedicine and the availability of in person appointments. The patient expressed understanding and agreed to proceed.  Present for the visit:  Myself, Dr Billey Gosling, [name].  The patient is currently at home and I am in the office.    No referring provider.    History of Present Illness: This is an acute visit for covd.  Her symptoms started two days ago - today is day three.  She tested positive for covid yesterday.  She is experiencing fatigue, chills, fever, nasal congestion, postnasal drip, cough, diarrhea, body aches, joint pain and headaches.  She has been taking extra strength Tylenol and that has helped.  She feels lousy and wanted to get something to feel better.   Review of Systems  Constitutional:  Positive for chills, fever and malaise/fatigue.  HENT:  Positive for congestion (PND). Negative for sinus pain and sore throat.   Respiratory:  Positive for cough. Negative for shortness of breath and wheezing.   Cardiovascular:  Negative for chest pain.  Gastrointestinal:  Positive for diarrhea.  Musculoskeletal:  Positive for joint pain and myalgias.  Neurological:  Positive for headaches.     Social History   Socioeconomic History   Marital status: Widowed    Spouse name: Not on file   Number of children: 0   Years of education: 14   Highest education level: 12th grade  Occupational History   Not on file  Tobacco Use   Smoking status: Never   Smokeless tobacco: Never  Vaping Use   Vaping Use: Never used  Substance and Sexual Activity   Alcohol use: No   Drug use: No   Sexual activity: Not on file  Other Topics Concern   Not on file  Social History Narrative   Retired Economist for [employer]   Widowed (husband - Eddie Dibbles), no children   Never Smoked    Alcohol use-no      Daily Caffeine Use 2/day   Social Determinants of Radio broadcast assistant Strain: Low Risk    Difficulty of Paying Living Expenses: Not hard at all  Food Insecurity: No Food Insecurity   Worried About Charity fundraiser in the Last Year: Never true   Arboriculturist in the Last Year: Never true  Transportation Needs: No Transportation Needs   Lack of Transportation (Medical): No   Lack of Transportation (Non-Medical): No  Physical Activity: Insufficiently Active   Days of Exercise per Week: 3 days   Minutes of Exercise per Session: 30 min  Stress: No Stress Concern Present   Feeling of Stress : Not at all  Social Connections: Moderately Integrated   Frequency of Communication with Friends and Family: More than three times a week   Frequency of Social Gatherings with Friends and Family: More than three times a week   Attends Religious Services: More than 4 times per year   Active Member of [employer] or Organizations: Yes   Attends Archivist Meetings: More than 4 times per year   Marital Status: Widowed     Observations/Objective:    Assessment and Plan:  See Problem List for Assessment and Plan of chronic medical problems.   Follow Up  Instructions:    I discussed the assessment and treatment plan with the patient. The patient was provided an opportunity to ask questions and all were answered. The patient agreed with the plan and demonstrated an understanding of the instructions.   The patient was advised to call back or seek an in-person evaluation if the symptoms worsen or if the condition fails to improve as anticipated.  Time spent telephone call: 12 minutes  Binnie Rail, MD

## 2021-04-21 NOTE — Assessment & Plan Note (Signed)
Acute Symptoms started 2 days ago-today is day 3 Tested positive yesterday Symptoms mild-moderate and given age she is eligible for antiviral treatment Discussed options and that these are approved for emergency use treatment of COVID. Will prescribe molnupiravir 800 mg twice daily x5 days-discussed possible side effects will prescribe Tessalon Perles 200 mg 3 times daily as needed for cough Continue over-the-counter cold medications, Tylenol Rest, fluids Discussed quarantine recommendations Advised to call with any questions or concerns

## 2021-04-21 NOTE — Telephone Encounter (Signed)
Patient took at home COVID test and was positive 04/20/2021.Marland Kitchen experiencing fever 100.8 , chills, body ache, severe cough.   Would like a follow up call [phone] and would also like meds sent to pharmacy  Pharmacy:  [facility] [place], [place] Phone:  [phone]  Fax:  [phone]

## 2021-04-27 ENCOUNTER — Ambulatory Visit: Payer: PPO | Admitting: Internal Medicine

## 2021-04-28 ENCOUNTER — Ambulatory Visit: Payer: PPO | Admitting: Internal Medicine

## 2021-04-29 NOTE — Telephone Encounter (Signed)
Pt advised that it is not uncommon to have a positive covid test for some time after having covid.  Pt denies covid symptoms.  Pt advised that if her symptoms return or she develops new symptoms she should test or make a VV with PCP.  Pt verb understanding.

## 2021-04-29 NOTE — Telephone Encounter (Signed)
Patient is concerned bc she is still showing COVID+ (at home test) after a week & taking antiviral meds... patient is not having any symptoms  Wants to know if she needs to go somewhere else to get tested or is this normal to still be showing positive   Req callback [phone]

## 2021-05-04 DIAGNOSIS — Z85828 Personal history of other malignant neoplasm of skin: Secondary | ICD-10-CM | POA: Diagnosis not present

## 2021-05-04 DIAGNOSIS — L814 Other melanin hyperpigmentation: Secondary | ICD-10-CM | POA: Diagnosis not present

## 2021-05-04 DIAGNOSIS — L821 Other seborrheic keratosis: Secondary | ICD-10-CM | POA: Diagnosis not present

## 2021-05-05 NOTE — Progress Notes (Signed)
Subjective:    Patient ID: [name], female    DOB: Aug 02, 1946, 75 y.o.   MRN: [id number]  HPI The patient is here for follow up of their chronic medical problems, including prediabetes, hypothyroidism, anxiety, depression  She is doing ok off the lexapro.  She has some anxiety, but denies depression.  Anxiety is related [place] the country.  She has bowel urgency after eating out.  This started a couple of months ago.  She did have an episode of fecal incontinence.   She typically has diarrhea - her stool is not formed.  She may have a BM every 3rd day.  No blood in the stool.  No abdominal cramping or nausea.    No regular exercise.   Medications and allergies reviewed with patient and updated if appropriate.  Patient Active Problem List   Diagnosis Date Noted   COVID 04/21/2021   Aortic atherosclerosis ([place]) 10/26/2020   Sigmoid diverticulosis 10/26/2020   Acute sinus infection 09/28/2020   Occipital neuralgia of right side 03/24/2020   Venous insufficiency of right leg 06/23/2017   Anxiety and depression 09/11/2016   Prediabetes 06/21/2016   Hypothyroidism (acquired) 05/17/2011   GERD (gastroesophageal reflux disease) 01/17/2009   NEPHROLITHIASIS, HX OF 12/03/2007    Current Outpatient Medications on File Prior to Visit  Medication Sig Dispense Refill   aspirin 81 MG tablet Take 81 mg by mouth daily.     levothyroxine (SYNTHROID) 50 MCG tablet Take 1 tablet (50 mcg total) by mouth daily. 90 tablet 3   No current facility-administered medications on file prior to visit.    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 ( Dr Tonia Brooms)    Subclinical hypothyroidism 05/17/2011   Venous insufficiency of right leg 06/23/2017   Follows with vascular   Weakness     Past Surgical History:  Procedure Laterality Date   CHOLECYSTECTOMY N/A 06/13/2017   Procedure: LAPAROSCOPIC CHOLECYSTECTOMY;  Surgeon: Vickie Epley, MD;  Location: [facility] ORS;  Service: General;  Laterality: N/A;   COLONOSCOPY     MASS EXCISION     right upper inner thigh or cyst   WISDOM TOOTH EXTRACTION      Social History   Socioeconomic History   Marital status: Widowed    Spouse name: Not on file   Number of children: 0   Years of education: 23   Highest education level: 12th grade  Occupational History   Not on file  Tobacco Use   Smoking status: Never   Smokeless tobacco: Never  Vaping Use   Vaping Use: Never used  Substance and Sexual Activity   Alcohol use: No   Drug use: No   Sexual activity: Not on file  Other Topics Concern   Not on file  Social History Narrative   Retired Merchandiser, retail for [employer]   Widowed (husband - Eddie Dibbles), no children   Never Smoked    Alcohol use-no      Daily Caffeine Use 2/day   Social Determinants of Health   Financial Resource Strain: Low Risk  Difficulty of Paying Living Expenses: Not hard at all  Food Insecurity: No Food Insecurity   Worried About [place] in the Last Year: Never true   Ran Out of Food in the Last Year: Never true  Transportation Needs: No Transportation Needs   Lack of Transportation (Medical): No   Lack of Transportation (Non-Medical): No  Physical Activity: Insufficiently Active   Days of Exercise per Week: 3 days   Minutes of Exercise per Session: 30 min  Stress: No Stress Concern Present   Feeling of Stress : Not at all  Social Connections: Moderately Integrated   Frequency of Communication with Friends and Family: More than three times a week   Frequency of Social Gatherings with Friends and Family: More than three times a week    Attends Religious Services: More than 4 times per year   Active Member of [employer] or Organizations: Yes   Attends Archivist Meetings: More than 4 times per year   Marital Status: Widowed    Family History  Problem Relation Age of Onset   COPD Mother        deceased 10/17/2008   Alzheimer's disease Father        deceased 08-17-09   Coronary artery disease Father        s/p cabg   Breast cancer Maternal Grandmother    Colon cancer Neg Hx    Colon polyps Neg Hx    Esophageal cancer Neg Hx    Rectal cancer Neg Hx    Stomach cancer Neg Hx     Review of Systems     Objective:   Vitals:   05/06/21 0953  BP: 140/88  Pulse: 72  Temp: 99.1 F (37.3 C)  SpO2: 95%   BP Readings from Last 3 Encounters:  05/06/21 140/88  03/23/21 120/70  10/27/20 130/80   Wt Readings from Last 3 Encounters:  05/06/21 246 lb (111.6 kg)  03/23/21 248 lb 6.4 oz (112.7 kg)  10/27/20 240 lb (108.9 kg)   Body mass index is 37.4 kg/m.   Physical Exam    Constitutional: Appears well-developed and well-nourished. No distress.  HENT:  Head: Normocephalic and atraumatic.  Neck: Neck supple. No tracheal deviation present. No thyromegaly present.  No cervical lymphadenopathy Cardiovascular: Normal rate, regular rhythm and normal heart sounds.   No murmur heard. No carotid bruit .  No edema Pulmonary/Chest: Effort normal and breath sounds normal. No respiratory distress. No has no wheezes. No rales. Abdomen: Soft, nontender, nondistended Skin: Skin is warm and dry. Not diaphoretic.  Psychiatric: Normal mood and affect. Behavior is normal.    The 10-year ASCVD risk score Mikey Bussing DC Jr., et al., 2013) is: 15.4%   Values used to calculate the score:     Age: 43 years     Sex: Female     Is Non-Hispanic African American: No     Diabetic: No     Tobacco smoker: No     Systolic Blood Pressure: XX123456 mmHg     Is BP treated: No     HDL Cholesterol: 65 mg/dL     Total Cholesterol: 221  mg/dL   Assessment & Plan:    See Problem List for Assessment and Plan of chronic medical problems.    This visit occurred during the SARS-CoV-2 public health emergency.  Safety protocols were in place, including screening questions prior to the visit, additional usage of staff PPE, and extensive cleaning of exam room while observing appropriate  contact time as indicated for disinfecting solutions.

## 2021-05-05 NOTE — Patient Instructions (Addendum)
    Medications changes include :   start taking vitamin d 1000-2000 units a day. Start B12 1000 mcg daily or a B complex.   Start taking probiotic ( align ) and benefiber daily.      Please followup in 1  year

## 2021-05-06 ENCOUNTER — Other Ambulatory Visit: Payer: Self-pay

## 2021-05-06 ENCOUNTER — Ambulatory Visit (INDEPENDENT_AMBULATORY_CARE_PROVIDER_SITE_OTHER): Payer: PPO | Admitting: Internal Medicine

## 2021-05-06 ENCOUNTER — Encounter: Payer: Self-pay | Admitting: Internal Medicine

## 2021-05-06 VITALS — BP 140/88 | HR 72 | Temp 99.1°F | Ht 68.0 in | Wt 246.0 lb

## 2021-05-06 DIAGNOSIS — K219 Gastro-esophageal reflux disease without esophagitis: Secondary | ICD-10-CM

## 2021-05-06 DIAGNOSIS — E782 Mixed hyperlipidemia: Secondary | ICD-10-CM

## 2021-05-06 DIAGNOSIS — R198 Other specified symptoms and signs involving the digestive system and abdomen: Secondary | ICD-10-CM | POA: Diagnosis not present

## 2021-05-06 DIAGNOSIS — I700 Atherosclerosis of aorta: Secondary | ICD-10-CM

## 2021-05-06 DIAGNOSIS — E039 Hypothyroidism, unspecified: Secondary | ICD-10-CM

## 2021-05-06 DIAGNOSIS — R7303 Prediabetes: Secondary | ICD-10-CM

## 2021-05-06 DIAGNOSIS — F419 Anxiety disorder, unspecified: Secondary | ICD-10-CM

## 2021-05-06 MED ORDER — OMEPRAZOLE 20 MG PO CPDR: 20.0000 mg | DELAYED_RELEASE_CAPSULE | Freq: Two times a day (BID) | ORAL | 5 refills | Status: DC | PRN

## 2021-05-06 NOTE — Assessment & Plan Note (Signed)
Chronic  Clinically euthyroid Currently taking levothyroxine 50 mcg qd

## 2021-05-06 NOTE — Assessment & Plan Note (Signed)
Chronic LDL higher than ideal and ASCVD risk elevated She does have aortic atherosclerosis Discussed that she should consider going on a statin, but she does not want to do that Stressed low-fat/cholesterol diet, regular exercise and weight loss

## 2021-05-06 NOTE — Assessment & Plan Note (Signed)
Chronic LDL higher than ideal Discussed that according to the guidelines she should consider being on a cholesterol medication-statin, but she deferred Stressed improving her diet, exercising regularly and working on weight loss

## 2021-05-06 NOTE — Assessment & Plan Note (Addendum)
Chronic Lab Results  Component Value Date   HGBA1C 5.8 04/16/2021   Low sugar / carb diet Stressed regular exercise Discussed the importance of weight loss

## 2021-05-06 NOTE — Assessment & Plan Note (Signed)
Chronic gerd controlled Continue omeprazole 20 mg bid prn

## 2021-05-06 NOTE — Assessment & Plan Note (Signed)
Acute Recently she has been having left changing her bowel habits and is unsure why No change in medications, supplements or diet Stools are not formed and she has episodes of urgency and had fecal incontinence-this is worse after eating at a restaurant No blood in the stool, abdominal cramping Recent colonoscopy with a few polyps only Advised starting Benefiber daily and a probiotic such as align If there is no improvement she will let me know and I can refer to GI

## 2021-05-08 ENCOUNTER — Other Ambulatory Visit: Payer: Self-pay | Admitting: Internal Medicine

## 2021-05-11 DIAGNOSIS — I83813 Varicose veins of bilateral lower extremities with pain: Secondary | ICD-10-CM | POA: Diagnosis not present

## 2021-05-11 DIAGNOSIS — I83893 Varicose veins of bilateral lower extremities with other complications: Secondary | ICD-10-CM | POA: Diagnosis not present

## 2021-06-08 DIAGNOSIS — I8311 Varicose veins of right lower extremity with inflammation: Secondary | ICD-10-CM | POA: Diagnosis not present

## 2021-06-08 DIAGNOSIS — I8312 Varicose veins of left lower extremity with inflammation: Secondary | ICD-10-CM | POA: Diagnosis not present

## 2021-06-15 DIAGNOSIS — I8312 Varicose veins of left lower extremity with inflammation: Secondary | ICD-10-CM | POA: Diagnosis not present

## 2021-06-15 DIAGNOSIS — I83813 Varicose veins of bilateral lower extremities with pain: Secondary | ICD-10-CM | POA: Diagnosis not present

## 2021-06-15 DIAGNOSIS — I83893 Varicose veins of bilateral lower extremities with other complications: Secondary | ICD-10-CM | POA: Diagnosis not present

## 2021-06-15 DIAGNOSIS — I8311 Varicose veins of right lower extremity with inflammation: Secondary | ICD-10-CM | POA: Diagnosis not present

## 2021-07-01 DIAGNOSIS — I83812 Varicose veins of left lower extremities with pain: Secondary | ICD-10-CM | POA: Diagnosis not present

## 2021-07-01 DIAGNOSIS — I83892 Varicose veins of left lower extremities with other complications: Secondary | ICD-10-CM | POA: Diagnosis not present

## 2021-07-01 DIAGNOSIS — I8312 Varicose veins of left lower extremity with inflammation: Secondary | ICD-10-CM | POA: Diagnosis not present

## 2021-07-05 DIAGNOSIS — I8312 Varicose veins of left lower extremity with inflammation: Secondary | ICD-10-CM | POA: Diagnosis not present

## 2021-07-29 DIAGNOSIS — I8311 Varicose veins of right lower extremity with inflammation: Secondary | ICD-10-CM | POA: Diagnosis not present

## 2021-07-30 DIAGNOSIS — I8312 Varicose veins of left lower extremity with inflammation: Secondary | ICD-10-CM | POA: Diagnosis not present

## 2021-08-16 DIAGNOSIS — I83811 Varicose veins of right lower extremities with pain: Secondary | ICD-10-CM | POA: Diagnosis not present

## 2021-08-16 DIAGNOSIS — Z09 Encounter for follow-up examination after completed treatment for conditions other than malignant neoplasm: Secondary | ICD-10-CM | POA: Diagnosis not present

## 2021-08-16 DIAGNOSIS — I8311 Varicose veins of right lower extremity with inflammation: Secondary | ICD-10-CM | POA: Diagnosis not present

## 2021-09-14 DIAGNOSIS — Z1231 Encounter for screening mammogram for malignant neoplasm of breast: Secondary | ICD-10-CM | POA: Diagnosis not present

## 2021-09-14 LAB — HM MAMMOGRAPHY

## 2021-10-20 DIAGNOSIS — L918 Other hypertrophic disorders of the skin: Secondary | ICD-10-CM | POA: Diagnosis not present

## 2021-10-20 DIAGNOSIS — Z85828 Personal history of other malignant neoplasm of skin: Secondary | ICD-10-CM | POA: Diagnosis not present

## 2021-10-20 DIAGNOSIS — D225 Melanocytic nevi of trunk: Secondary | ICD-10-CM | POA: Diagnosis not present

## 2021-10-20 DIAGNOSIS — D1801 Hemangioma of skin and subcutaneous tissue: Secondary | ICD-10-CM | POA: Diagnosis not present

## 2021-10-20 DIAGNOSIS — L821 Other seborrheic keratosis: Secondary | ICD-10-CM | POA: Diagnosis not present

## 2021-10-20 DIAGNOSIS — Z419 Encounter for procedure for purposes other than remedying health state, unspecified: Secondary | ICD-10-CM | POA: Diagnosis not present

## 2021-10-20 DIAGNOSIS — L570 Actinic keratosis: Secondary | ICD-10-CM | POA: Diagnosis not present

## 2021-11-07 ENCOUNTER — Other Ambulatory Visit: Payer: Self-pay | Admitting: Internal Medicine

## 2021-11-08 ENCOUNTER — Encounter: Payer: Self-pay | Admitting: Internal Medicine

## 2021-11-08 NOTE — Progress Notes (Signed)
Outside notes received. Information abstracted. Notes sent to scan.  

## 2022-01-28 ENCOUNTER — Telehealth: Payer: Self-pay | Admitting: Internal Medicine

## 2022-01-28 DIAGNOSIS — R7303 Prediabetes: Secondary | ICD-10-CM

## 2022-01-28 DIAGNOSIS — E782 Mixed hyperlipidemia: Secondary | ICD-10-CM

## 2022-01-28 DIAGNOSIS — E039 Hypothyroidism, unspecified: Secondary | ICD-10-CM

## 2022-01-28 NOTE — Telephone Encounter (Signed)
Blood work ordered.

## 2022-01-28 NOTE — Telephone Encounter (Signed)
Lab appointment

## 2022-01-31 ENCOUNTER — Other Ambulatory Visit (INDEPENDENT_AMBULATORY_CARE_PROVIDER_SITE_OTHER): Payer: PPO

## 2022-01-31 DIAGNOSIS — E039 Hypothyroidism, unspecified: Secondary | ICD-10-CM | POA: Diagnosis not present

## 2022-01-31 DIAGNOSIS — R7303 Prediabetes: Secondary | ICD-10-CM | POA: Diagnosis not present

## 2022-01-31 DIAGNOSIS — E782 Mixed hyperlipidemia: Secondary | ICD-10-CM

## 2022-01-31 LAB — LIPID PANEL
Cholesterol: 219 mg/dL — ABNORMAL HIGH (ref 0–200)
HDL: 64 mg/dL (ref >39.00)
LDL Cholesterol: 136 mg/dL — ABNORMAL HIGH (ref 0–99)
NonHDL: 154.52
Total CHOL/HDL Ratio: 3
Triglycerides: 92 mg/dL (ref 0.0–149.0)
VLDL: 18.4 mg/dL (ref 0.0–40.0)

## 2022-01-31 LAB — COMPREHENSIVE METABOLIC PANEL
ALT: 19 U/L (ref 0–35)
AST: 19 U/L (ref 0–37)
Albumin: 3.8 g/dL (ref 3.5–5.2)
Alkaline Phosphatase: 60 U/L (ref 39–117)
BUN: 19 mg/dL (ref 6–23)
CO2: 30 mEq/L (ref 19–32)
Calcium: 8.8 mg/dL (ref 8.4–10.5)
Chloride: 103 mEq/L (ref 96–112)
Creatinine, Ser: 1.08 mg/dL (ref 0.40–1.20)
GFR: 50.66 mL/min — ABNORMAL LOW (ref >60.00)
Glucose, Bld: 110 mg/dL — ABNORMAL HIGH (ref 70–99)
Potassium: 5 mEq/L (ref 3.5–5.1)
Sodium: 139 mEq/L (ref 135–145)
Total Bilirubin: 0.4 mg/dL (ref 0.2–1.2)
Total Protein: 6.8 g/dL (ref 6.0–8.3)

## 2022-01-31 LAB — CBC WITH DIFFERENTIAL/PLATELET
Basophils Absolute: 0.1 10*3/uL (ref 0.0–0.1)
Basophils Relative: 0.9 % (ref 0.0–3.0)
Eosinophils Absolute: 0.1 10*3/uL (ref 0.0–0.7)
Eosinophils Relative: 2.1 % (ref 0.0–5.0)
HCT: 37.1 % (ref 36.0–46.0)
Hemoglobin: 12.6 g/dL (ref 12.0–15.0)
Lymphocytes Relative: 36.2 % (ref 12.0–46.0)
Lymphs Abs: 2 10*3/uL (ref 0.7–4.0)
MCHC: 33.9 g/dL (ref 30.0–36.0)
MCV: 89.7 fl (ref 78.0–100.0)
Monocytes Absolute: 0.5 10*3/uL (ref 0.1–1.0)
Monocytes Relative: 8.1 % (ref 3.0–12.0)
Neutro Abs: 3 10*3/uL (ref 1.4–7.7)
Neutrophils Relative %: 52.7 % (ref 43.0–77.0)
Platelets: 156 10*3/uL (ref 150.0–400.0)
RBC: 4.14 Mil/uL (ref 3.87–5.11)
RDW: 13.4 % (ref 11.5–15.5)
WBC: 5.6 10*3/uL (ref 4.0–10.5)

## 2022-01-31 LAB — TSH: TSH: 2.99 u[IU]/mL (ref 0.35–5.50)

## 2022-01-31 LAB — HEMOGLOBIN A1C: Hgb A1c MFr Bld: 5.8 % (ref 4.6–6.5)

## 2022-02-01 NOTE — Progress Notes (Signed)
? ? ?Subjective:  ? ? Patient ID: [name], female    DOB: August 16, 1946, 76 y.o.   MRN: [id number] ? ? ? ? ? ?HPI ?[name] is here for  ?Chief Complaint  ?Patient presents with  ? Annual Exam  ? ? ?Had labs done.  Overall doing well.  She is frustrated by her weight. ? ? ?Medications and allergies reviewed with patient and updated if appropriate. ? ?Current Outpatient Medications on File Prior to Visit  ?Medication Sig Dispense Refill  ? aspirin 81 MG tablet Take 81 mg by mouth daily.    ? levothyroxine (SYNTHROID) 50 MCG tablet TAKE 1 TABLET BY MOUTH EVERY DAY 90 tablet 3  ? ?No current facility-administered medications on file prior to visit.  ? ? ?Review of Systems  ?Constitutional:  Negative for fever.  ?Eyes:  Negative for visual disturbance.  ?Respiratory:  Positive for shortness of breath (when walking). Negative for cough and wheezing.   ?Cardiovascular:  Positive for chest pain (chest tightness when walking up incline or when hurrying x 6-8 months - getting a little worse) and palpitations (occ, transient). Negative for leg swelling.  ?Gastrointestinal:  Positive for abdominal pain (? related to constipation), constipation and diarrhea (alternating). Negative for blood in stool and nausea.  ?Genitourinary:  Negative for dysuria.  ?Musculoskeletal:  Positive for arthralgias (stiff in morning). Negative for back pain.  ?Skin:  Negative for rash.  ?Neurological:  Positive for dizziness (a little occasionally). Negative for light-headedness and headaches.  ?Psychiatric/Behavioral:  Negative for dysphoric mood. The patient is not nervous/anxious.   ? ?   ?Objective:  ? ?Vitals:  ? 02/02/22 0918  ?BP: 140/76  ?Pulse: 77  ?Temp: 98.4 ?F (36.9 ?C)  ?SpO2: 97%  ? ?Filed Weights  ? 02/02/22 0918  ?Weight: 253 lb (114.8 kg)  ? ?Body mass index is 38.47 kg/m?. ? ?BP Readings from Last 3 Encounters:  ?02/02/22 140/76  ?05/06/21 140/88  ?03/23/21 120/70  ? ? ?Wt Readings from Last 3 Encounters:  ?02/02/22 253  lb (114.8 kg)  ?05/06/21 246 lb (111.6 kg)  ?03/23/21 248 lb 6.4 oz (112.7 kg)  ? ? ? ?  03/23/2021  ?  2:45 PM 04/15/2020  ?  3:19 PM 01/28/2020  ?  8:14 AM 07/01/2019  ?  9:10 AM 07/01/2019  ?  7:53 AM  ?Depression screen PHQ 2/9  ?Decreased Interest 0 0 0 0 0  ?Down, Depressed, Hopeless 0 0 0 0 0  ?PHQ - 2 Score 0 0 0 0 0  ?Altered sleeping  0  0   ?Tired, decreased energy  0  1   ?Change in appetite  0  0   ?Feeling bad or failure about yourself   0  0   ?Trouble concentrating  0  0   ?Moving slowly or fidgety/restless  0  0   ?Suicidal thoughts  0  0   ?PHQ-9 Score  0  1   ?Difficult doing work/chores  Not difficult at all     ? ? ? ? ?  07/01/2019  ?  9:11 AM  ?GAD 7 : Generalized Anxiety Score  ?Nervous, Anxious, on Edge 0  ?Control/stop worrying 0  ?Worry too much - different things 1  ?Trouble relaxing 0  ?Restless 0  ?Easily annoyed or irritable 0  ?Afraid - awful might happen 1  ?Total GAD 7 Score 2  ? ? ? ? ?  ?Physical Exam ?Constitutional: She appears well-developed and well-nourished. No  distress.  ?HENT:  ?Head: Normocephalic and atraumatic.  ?Right Ear: External ear normal. Normal ear canal and TM ?Left Ear: External ear normal.  Normal ear canal and TM ?Mouth/Throat: Oropharynx is clear and moist.  ?Eyes: Conjunctivae normal.  ?Neck: Neck supple. No tracheal deviation present. No thyromegaly present.  ?No carotid bruit  ?Cardiovascular: Normal rate, regular rhythm and normal heart sounds.   ?No murmur heard.  No edema. ?Pulmonary/Chest: Effort normal and breath sounds normal. No respiratory distress. She has no wheezes. She has no rales.  ?Breast: deferred   ?Abdominal: Soft. She exhibits no distension. There is no tenderness.  ?Lymphadenopathy: She has no cervical adenopathy.  ?Skin: Skin is warm and dry. She is not diaphoretic.  ?Psychiatric: She has a normal mood and affect. Her behavior is normal.  ? ? ? ?Lab Results  ?Component Value Date  ? WBC 5.6 01/31/2022  ? HGB 12.6 01/31/2022  ? HCT 37.1  01/31/2022  ? PLT 156.0 01/31/2022  ? GLUCOSE 110 (H) 01/31/2022  ? CHOL 219 (H) 01/31/2022  ? TRIG 92.0 01/31/2022  ? HDL 64.00 01/31/2022  ? LDLDIRECT 152.1 06/26/2013  ? LDLCALC 136 (H) 01/31/2022  ? ALT 19 01/31/2022  ? AST 19 01/31/2022  ? NA 139 01/31/2022  ? K 5.0 01/31/2022  ? CL 103 01/31/2022  ? CREATININE 1.08 01/31/2022  ? BUN 19 01/31/2022  ? CO2 30 01/31/2022  ? TSH 2.99 01/31/2022  ? HGBA1C 5.8 01/31/2022  ? MICROALBUR 0.9 06/26/2013  ? ? ?The 10-year ASCVD risk score (Arnett DK, et al., 2019) is: 17.1% ?  Values used to calculate the score: ?    Age: 76 years ?    Sex: Female ?    Is Non-Hispanic African American: No ?    Diabetic: No ?    Tobacco smoker: No ?    Systolic Blood Pressure: 433 mmHg ?    Is BP treated: No ?    HDL Cholesterol: 64 mg/dL ?    Total Cholesterol: 219 mg/dL ? ? ?   ?Assessment & Plan:  ? ?Physical exam: ?Screening blood work  reviewed ?Exercise  not regular ?Weight  advised weight loss ?Substance abuse  none ? ? ?Reviewed recommended immunizations.  Advised  shingrix and tetanus.  ? ? ?Health Maintenance  ?Topic Date Due  ? COVID-19 Vaccine (3 - Booster for Pfizer series) 02/18/2022 (Originally 08/06/2020)  ? Zoster Vaccines- Shingrix (1 of 2) 05/05/2022 (Originally 10/01/1997)  ? TETANUS/TDAP  02/03/2023 (Originally 10/01/1966)  ? INFLUENZA VACCINE  04/26/2022  ? DEXA SCAN  07/26/2023  ? MAMMOGRAM  09/15/2023  ? COLONOSCOPY (Pts 45-75yr Insurance coverage will need to be confirmed)  06/05/2029  ? Pneumonia Vaccine 74 Years old  Completed  ? Hepatitis C Screening  Completed  ? HPV VACCINES  Aged Out  ?  ? ? ? ? ? ? ?See Problem List for Assessment and Plan of chronic medical problems. ? ? ? ? ?

## 2022-02-01 NOTE — Patient Instructions (Addendum)
? ? ? ?Medications changes include :   start metamucil daily.  Start a probiotic like align daily.   ? ? ?Your prescription(s) have been sent to your pharmacy.  ? ? ?A referral was ordered for cardiology - Dr Stanford Breed.     Someone from that office will call you to schedule an appointment.  ? ? ?Return in about 1 year (around 02/03/2023) for Physical Exam. ? ? ? ?Health Maintenance, Female ?Adopting a healthy lifestyle and getting preventive care are important in promoting health and wellness. Ask your health care provider about: ?The right schedule for you to have regular tests and exams. ?Things you can do on your own to prevent diseases and keep yourself healthy. ?What should I know about diet, weight, and exercise? ?Eat a healthy diet ? ?Eat a diet that includes plenty of vegetables, fruits, low-fat dairy products, and lean protein. ?Do not eat a lot of foods that are high in solid fats, added sugars, or sodium. ?Maintain a healthy weight ?Body mass index (BMI) is used to identify weight problems. It estimates body fat based on height and weight. Your health care provider can help determine your BMI and help you achieve or maintain a healthy weight. ?Get regular exercise ?Get regular exercise. This is one of the most important things you can do for your health. Most adults should: ?Exercise for at least 150 minutes each week. The exercise should increase your heart rate and make you sweat (moderate-intensity exercise). ?Do strengthening exercises at least twice a week. This is in addition to the moderate-intensity exercise. ?Spend less time sitting. Even light physical activity can be beneficial. ?Watch cholesterol and blood lipids ?Have your blood tested for lipids and cholesterol at 74 years of age, then have this test every 5 years. ?Have your cholesterol levels checked more often if: ?Your lipid or cholesterol levels are high. ?You are older than 74 years of age. ?You are at high risk for heart disease. ?What  should I know about cancer screening? ?Depending on your health history and family history, you may need to have cancer screening at various ages. This may include screening for: ?Breast cancer. ?Cervical cancer. ?Colorectal cancer. ?Skin cancer. ?Lung cancer. ?What should I know about heart disease, diabetes, and high blood pressure? ?Blood pressure and heart disease ?High blood pressure causes heart disease and increases the risk of stroke. This is more likely to develop in people who have high blood pressure readings or are overweight. ?Have your blood pressure checked: ?Every 3-5 years if you are 78-42 years of age. ?Every year if you are 81 years old or older. ?Diabetes ?Have regular diabetes screenings. This checks your fasting blood sugar level. Have the screening done: ?Once every three years after age 50 if you are at a normal weight and have a low risk for diabetes. ?More often and at a younger age if you are overweight or have a high risk for diabetes. ?What should I know about preventing infection? ?Hepatitis B ?If you have a higher risk for hepatitis B, you should be screened for this virus. Talk with your health care provider to find out if you are at risk for hepatitis B infection. ?Hepatitis C ?Testing is recommended for: ?Everyone born from [birth year] through 1963. ?Anyone with known risk factors for hepatitis C. ?Sexually transmitted infections (STIs) ?Get screened for STIs, including gonorrhea and chlamydia, if: ?You are sexually active and are younger than 74 years of age. ?You are older than 74 years of age  and your health care provider tells you that you are at risk for this type of infection. ?Your sexual activity has changed since you were last screened, and you are at increased risk for chlamydia or gonorrhea. Ask your health care provider if you are at risk. ?Ask your health care provider about whether you are at high risk for HIV. Your health care provider may recommend a prescription medicine  to help prevent HIV infection. If you choose to take medicine to prevent HIV, you should first get tested for HIV. You should then be tested every 3 months for as long as you are taking the medicine. ?Pregnancy ?If you are about to stop having your period (premenopausal) and you may become pregnant, seek counseling before you get pregnant. ?Take 400 to 800 micrograms (mcg) of folic acid every day if you become pregnant. ?Ask for birth control (contraception) if you want to prevent pregnancy. ?Osteoporosis and menopause ?Osteoporosis is a disease in which the bones lose minerals and strength with aging. This can result in bone fractures. If you are 75 years old or older, or if you are at risk for osteoporosis and fractures, ask your health care provider if you should: ?Be screened for bone loss. ?Take a calcium or vitamin D supplement to lower your risk of fractures. ?Be given hormone replacement therapy (HRT) to treat symptoms of menopause. ?Follow these instructions at home: ?Alcohol use ?Do not drink alcohol if: ?Your health care provider tells you not to drink. ?You are pregnant, may be pregnant, or are planning to become pregnant. ?If you drink alcohol: ?Limit how much you have to: ?0-1 drink a day. ?Know how much alcohol is in your drink. In the [place] one drink equals one 12 oz bottle of beer (355 mL), one 5 oz glass of wine (148 mL), or one 1? oz glass of hard liquor (44 mL). ?Lifestyle ?Do not use any products that contain nicotine or tobacco. These products include cigarettes, chewing tobacco, and vaping devices, such as e-cigarettes. If you need help quitting, ask your health care provider. ?Do not use street drugs. ?Do not share needles. ?Ask your health care provider for help if you need support or information about quitting drugs. ?General instructions ?Schedule regular health, dental, and eye exams. ?Stay current with your vaccines. ?Tell your health care provider if: ?You often feel depressed. ?You  have ever been abused or do not feel safe at home. ?Summary ?Adopting a healthy lifestyle and getting preventive care are important in promoting health and wellness. ?Follow your health care provider's instructions about healthy diet, exercising, and getting tested or screened for diseases. ?Follow your health care provider's instructions on monitoring your cholesterol and blood pressure. ?This information is not intended to replace advice given to you by your health care provider. Make sure you discuss any questions you have with your health care provider. ?Document Revised: 02/01/2021 Document Reviewed: 02/01/2021 ?Elsevier Patient Education ? [place]. ? ?

## 2022-02-02 ENCOUNTER — Encounter: Payer: PPO | Admitting: Internal Medicine

## 2022-02-02 ENCOUNTER — Encounter: Payer: Self-pay | Admitting: Internal Medicine

## 2022-02-02 ENCOUNTER — Ambulatory Visit (INDEPENDENT_AMBULATORY_CARE_PROVIDER_SITE_OTHER): Payer: PPO | Admitting: Internal Medicine

## 2022-02-02 VITALS — BP 140/76 | HR 77 | Temp 98.4°F | Ht 68.0 in | Wt 253.0 lb

## 2022-02-02 DIAGNOSIS — R0789 Other chest pain: Secondary | ICD-10-CM | POA: Insufficient documentation

## 2022-02-02 DIAGNOSIS — E039 Hypothyroidism, unspecified: Secondary | ICD-10-CM | POA: Diagnosis not present

## 2022-02-02 DIAGNOSIS — N183 Chronic kidney disease, stage 3 unspecified: Secondary | ICD-10-CM | POA: Insufficient documentation

## 2022-02-02 DIAGNOSIS — R7303 Prediabetes: Secondary | ICD-10-CM | POA: Diagnosis not present

## 2022-02-02 DIAGNOSIS — I700 Atherosclerosis of aorta: Secondary | ICD-10-CM

## 2022-02-02 DIAGNOSIS — K219 Gastro-esophageal reflux disease without esophagitis: Secondary | ICD-10-CM

## 2022-02-02 DIAGNOSIS — R195 Other fecal abnormalities: Secondary | ICD-10-CM | POA: Diagnosis not present

## 2022-02-02 DIAGNOSIS — Z0000 Encounter for general adult medical examination without abnormal findings: Secondary | ICD-10-CM | POA: Diagnosis not present

## 2022-02-02 DIAGNOSIS — N1831 Chronic kidney disease, stage 3a: Secondary | ICD-10-CM | POA: Diagnosis not present

## 2022-02-02 DIAGNOSIS — E782 Mixed hyperlipidemia: Secondary | ICD-10-CM | POA: Diagnosis not present

## 2022-02-02 MED ORDER — OMEPRAZOLE 20 MG PO CPDR: 20.0000 mg | DELAYED_RELEASE_CAPSULE | Freq: Every day | ORAL | 3 refills | Status: DC | PRN

## 2022-02-02 NOTE — Assessment & Plan Note (Addendum)
Chronic ?GERD controlled ?Taking Tums as needed ?Continue omeprazole 20 mg daily as needed ? ?

## 2022-02-02 NOTE — Assessment & Plan Note (Signed)
Chronic  ?Clinically euthyroid ?Continue taking levothyroxine 50 mcg daily ? ?Lab Results  ?Component Value Date  ? TSH 2.99 01/31/2022  ? ? ? ?

## 2022-02-02 NOTE — Assessment & Plan Note (Signed)
Chronic ?Lipids not ideal given aortic atherosclerosis and elevated ASCVD risk ?Advised statin but she deferred ?Continue lifestyle control ?Regular exercise and healthy diet encouraged ? ?

## 2022-02-02 NOTE — Assessment & Plan Note (Signed)
New ?States that for the past 6-8 months she has been experiencing some chest tightness and shortness of breath with exertion-more moderate exertion or when she hurries ?She states she feels it is getting worse ?Discussed concern for cardiac cause ?Needs further evaluation ?Referral ordered for cardiology ?

## 2022-02-02 NOTE — Assessment & Plan Note (Signed)
Chronic ?Lab Results  ?Component Value Date  ? LDLCALC 136 (H) 01/31/2022  ? ?LDL higher than ideal ?Advised starting a statin, but she declined ?Discussed importance of healthy diet low in fats/cholesterol, regular exercise, weight loss ?

## 2022-02-02 NOTE — Assessment & Plan Note (Addendum)
Subacute-she did mention something similar last summer ?Having alternating diarrhea and constipation ?Has some urgency at times resulting in incontinence ?Has had some abdominal pain at times ?No weight loss or blood in the stool ?Last colonoscopy 2020 ?Start metamucil and a probiotic daily, such as align ?If no improvement should see GI ?

## 2022-02-02 NOTE — Assessment & Plan Note (Signed)
Chronic ?Low sugar / carb diet ?Stressed regular exercise ?Encouraged weight loss ? ?Lab Results  ?Component Value Date  ? HGBA1C 5.8 01/31/2022  ? ? ? ? ?

## 2022-02-02 NOTE — Assessment & Plan Note (Addendum)
New ?GFR decreased x 2 ?Does not takes nsaids ?Continue increased fluids ?Encouraged weight loss ??  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

## 2022-02-11 NOTE — Progress Notes (Signed)
Referring-Stacy Burns, MD Reason for referral-chest pain  HPI: 76 year old female for evaluation of chest pain at request of Billey Gosling, MD.  Laboratories May 2023 showed normal TSH, hemoglobin 12.6, creatinine 1.08, potassium 5, normal liver functions, LDL 136.  Patient previously seen but not since 2010.  Nuclear study in 2010 showing ejection fraction 76% and no scar or ischemia.  Patient states that for the past year she has had occasional chest tightness.  This occurs with walking up hills or more vigorous work.  It does not occur with routine activities.  It is relieved with slowing down or stopping her activities.  There is also associated back tightness.  No associated symptoms.  She otherwise does not have dyspnea on exertion, orthopnea, PND or syncope.  Occasional minimal pedal edema.  Cardiology now asked to evaluate.  Current Outpatient Medications  Medication Sig Dispense Refill   aspirin 81 MG tablet Take 81 mg by mouth daily.     levothyroxine (SYNTHROID) 50 MCG tablet TAKE 1 TABLET BY MOUTH EVERY DAY 90 tablet 3   omeprazole (PRILOSEC) 20 MG capsule Take 1 capsule (20 mg total) by mouth daily as needed. (Patient not taking: Reported on 02/17/2022) 90 capsule 3   No current facility-administered medications for this visit.    Allergies  Allergen Reactions   Atorvastatin Other (See Comments)    REACTION: Upset stomach   Augmentin [Amoxicillin-Pot Clavulanate] Diarrhea     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 ( Dr Tonia Brooms)   Subclinical hypothyroidism 05/17/2011   Venous insufficiency of right leg 06/23/2017   Follows with vascular   Weakness     Past  Surgical History:  Procedure Laterality Date   CHOLECYSTECTOMY N/A 06/13/2017   Procedure: LAPAROSCOPIC CHOLECYSTECTOMY;  Surgeon: Vickie Epley, MD;  Location: [facility] ORS;  Service: General;  Laterality: N/A;   COLONOSCOPY     MASS EXCISION     right upper inner thigh or cyst   WISDOM TOOTH EXTRACTION      Social History   Socioeconomic History   Marital status: Widowed    Spouse name: Not on file   Number of children: 0   Years of education: 34   Highest education level: 12th grade  Occupational History   Not on file  Tobacco Use   Smoking status: Never   Smokeless tobacco: Never  Vaping Use   Vaping Use: Never used  Substance and Sexual Activity   Alcohol use: No   Drug use: No   Sexual activity: Not on file  Other Topics Concern   Not on file  Social History Narrative   Retired Merchandiser, retail for [employer]   Widowed (husband - Eddie Dibbles), no children   Never Smoked    Alcohol use-no      Daily Caffeine Use 2/day   Social Determinants of Health   Financial Resource Strain: Low Risk    Difficulty of Paying Living Expenses: Not hard at all  Food Insecurity: No Food Insecurity   Worried About Charity fundraiser in the Last Year: Never true   [employer] of  Food in the Last Year: Never true  Transportation Needs: No Transportation Needs   Lack of Transportation (Medical): No   Lack of Transportation (Non-Medical): No  Physical Activity: Insufficiently Active   Days of Exercise per Week: 3 days   Minutes of Exercise per Session: 30 min  Stress: No Stress Concern Present   Feeling of Stress : Not at all  Social Connections: Moderately Integrated   Frequency of Communication with Friends and Family: More than three times a week   Frequency of Social Gatherings with Friends and Family: More than three times a week   Attends Religious Services: More than 4 times per year   Active Member of [employer] or Organizations: Yes   Attends Archivist  Meetings: More than 4 times per year   Marital Status: Widowed  Intimate Partner Violence: Not At Risk   Fear of Current or Ex-Partner: No   Emotionally Abused: No   Physically Abused: No   Sexually Abused: No    Family History  Problem Relation Age of Onset   COPD Mother        deceased 10/30/08   Alzheimer's disease Father        deceased 2009/08/30   Coronary artery disease Father        s/p cabg   Breast cancer Maternal Grandmother    Colon cancer Neg Hx    Colon polyps Neg Hx    Esophageal cancer Neg Hx    Rectal cancer Neg Hx    Stomach cancer Neg Hx     ROS: no fevers or chills, productive cough, hemoptysis, dysphasia, odynophagia, melena, hematochezia, dysuria, hematuria, rash, seizure activity, orthopnea, PND, pedal edema, claudication. Remaining systems are negative.  Physical Exam:   Blood pressure 140/72, pulse 75, height '5\' 8"'$  (1.727 m), weight 252 lb 3.2 oz (114.4 kg), SpO2 94 %.  General:  Well developed/well nourished in NAD Skin warm/dry Patient not depressed No peripheral clubbing Back-normal HEENT-normal/normal eyelids Neck supple/normal carotid upstroke bilaterally; no bruits; no JVD; no thyromegaly chest - CTA/ normal expansion CV - RRR/normal S1 and S2; no murmurs, rubs or gallops;  PMI nondisplaced Abdomen -NT/ND, no HSM, no mass, + bowel sounds, no bruit 2+ femoral pulses, no bruits Ext-no edema, chords, 2+ DP Neuro-grossly nonfocal  ECG -normal sinus rhythm at a rate of 75, no ST changes.  Personally reviewed  A/P  1 chest pain-patient presents with symptoms that are consistent with angina.  They occur with vigorous activity and resolve with slowing down or stopping activities.  We will continue aspirin.  Add toprol 25 mg daily.  Add Crestor 20 mg daily.  We discussed the options of cardiac CTA versus proceeding directly to catheterization.  She would prefer noninvasive evaluation first.  We will therefore arrange a cardiac CTA.  I will see her  back soon after for further discussions.  2 hyperlipidemia-LDL 136 and likely has underlying coronary disease given description of chest pain.  Will treat with Crestor 20 mg daily.  Check lipids and liver in 8 weeks.  Kirk Ruths, MD

## 2022-02-17 ENCOUNTER — Ambulatory Visit: Payer: PPO | Admitting: Cardiology

## 2022-02-17 ENCOUNTER — Encounter: Payer: Self-pay | Admitting: Cardiology

## 2022-02-17 VITALS — BP 140/72 | HR 75 | Ht 68.0 in | Wt 252.2 lb

## 2022-02-17 DIAGNOSIS — E7800 Pure hypercholesterolemia, unspecified: Secondary | ICD-10-CM

## 2022-02-17 DIAGNOSIS — R072 Precordial pain: Secondary | ICD-10-CM

## 2022-02-17 MED ORDER — METOPROLOL SUCCINATE ER 25 MG PO TB24: 25.0000 mg | ORAL_TABLET | Freq: Every day | ORAL | 3 refills | Status: DC

## 2022-02-17 MED ORDER — ROSUVASTATIN CALCIUM 20 MG PO TABS: 20.0000 mg | ORAL_TABLET | Freq: Every day | ORAL | 3 refills | Status: DC

## 2022-02-17 MED ORDER — METOPROLOL TARTRATE 25 MG PO TABS: ORAL_TABLET | ORAL | 0 refills | Status: DC

## 2022-02-17 NOTE — Patient Instructions (Signed)
Medication Instructions:   START METOPROLOL SUCC ER 25 MG ONCE DAILY AT BEDTIME  START ROSUVASTATIN 20 MG ONCE DAILY  *If you need a refill on your cardiac medications before your next appointment, please call your pharmacy*   Lab Work:  Your physician recommends that you return for lab work in: [place]  If you have labs (blood work) drawn today and your tests are completely normal, you will receive your results only by: [place] (if you have MyChart) OR A paper copy in the mail If you have any lab test that is abnormal or we need to change your treatment, we will call you to review the results.   Testing/Procedures:   Your cardiac CT will be scheduled at   [facility] [place], [place] [address] [phone]   If scheduled at [facility], please arrive at the [facility] and Children's Entrance (Entrance C2) of [facility] 30 minutes prior to test start time. You can use the FREE valet parking offered at entrance C (encouraged to control the heart rate for the test)  Proceed to the [facility] Radiology Department (first floor) to check-in and test prep.  All radiology patients and guests should use entrance C2 at [facility], accessed from [facility], even though the hospital's physical address listed is [address]     Please follow these instructions carefully (unless otherwise directed):    On the Night Before the Test: Be sure to Drink plenty of water. Do not consume any caffeinated/decaffeinated beverages or chocolate 12 hours prior to your test. Do not take any antihistamines 12 hours prior to your test.   On the Day of the Test: Drink plenty of water until 1 hour prior to the test. Do not eat any food 4 hours prior to the test. You may take your regular medications prior to the test.  Take metoprolol (Lopressor) 100 MG two hours prior to test. HOLD  Furosemide/Hydrochlorothiazide morning of the test. FEMALES- please wear underwire-free bra if available, avoid dresses & tight clothing       After the Test: Drink plenty of water. After receiving IV contrast, you may experience a mild flushed feeling. This is normal. On occasion, you may experience a mild rash up to 24 hours after the test. This is not dangerous. If this occurs, you can take Benadryl 25 mg and increase your fluid intake. If you experience trouble breathing, this can be serious. If it is severe call 911 IMMEDIATELY. If it is mild, please call our office. If you take any of these medications: Glipizide/Metformin, Avandament, Glucavance, please do not take 48 hours after completing test unless otherwise instructed.  We will call to schedule your test 2-4 weeks out understanding that some insurance companies will need an authorization prior to the service being performed.   For non-scheduling related questions, please contact the cardiac imaging nurse navigator should you have any questions/concerns: Marchia Bond, Cardiac Imaging Nurse Navigator Gordy Clement, Cardiac Imaging Nurse Navigator [facility] Direct Office Dial: [phone]   For scheduling needs, including cancellations and rescheduling, please call [place], [phone].    Follow-Up: At [facility], you and your health needs are our priority.  As part of our continuing mission to provide you with exceptional heart care, we have created designated Provider Care Teams.  These Care Teams include your primary Cardiologist (physician) and Advanced Practice Providers (APPs -  Physician Assistants and Nurse  Practitioners) who all work together to provide you with the care you need, when you need it.  We recommend signing up for the patient portal called "MyChart".  Sign up information is provided on this After Visit Summary.  MyChart is used to connect with patients for Virtual Visits  (Telemedicine).  Patients are able to view lab/test results, encounter notes, upcoming appointments, etc.  Non-urgent messages can be sent to your provider as well.   To learn more about what you can do with MyChart, go to [email or web address].    Your next appointment:   6 week(s)  The format for your next appointment:   In Person  Provider:   Kirk Ruths MD     Important Information About Sugar

## 2022-02-18 ENCOUNTER — Telehealth: Payer: Self-pay | Admitting: Cardiology

## 2022-02-18 ENCOUNTER — Telehealth: Payer: Self-pay | Admitting: Internal Medicine

## 2022-02-18 NOTE — Telephone Encounter (Signed)
Pt c/o medication issue:  1. Name of Medication: rosuvastatin (CRESTOR) 20 MG tablet  2. How are you currently taking this medication (dosage and times per day)? Patient has not started yet   3. Are you having a reaction (difficulty breathing--STAT)? No   4. What is your medication issue? Cost  Patient said she went to her pharmacy and was told it would be $300 for a three month supply. She can not afford that   She wanted to know if there were any alternatives that would bring down the cost.

## 2022-02-18 NOTE — Telephone Encounter (Signed)
Patient would like copy of her latest blood work mailed to her home

## 2022-02-18 NOTE — Telephone Encounter (Signed)
Called and spoke with [facility]- pharmacist states after re processing the crestor the price is 10$ for prescription.   Called and spoke with patient and made her aware. She will go and pick up prescription. Advised patient to call back to office with any issues, questions, or concerns. Patient verbalized understanding.

## 2022-02-18 NOTE — Telephone Encounter (Signed)
Mailed out to patient today.

## 2022-02-23 ENCOUNTER — Telehealth: Payer: Self-pay | Admitting: Cardiology

## 2022-02-23 MED ORDER — METOPROLOL TARTRATE 100 MG PO TABS: ORAL_TABLET | ORAL | 0 refills | Status: DC

## 2022-02-23 NOTE — Telephone Encounter (Signed)
Pt c/o medication issue:  1. Name of Medication: metoprolol tartrate (LOPRESSOR) 25 MG tablet  2. How are you currently taking this medication (dosage and times per day)?   3. Are you having a reaction (difficulty breathing--STAT)?   4. What is your medication issue?  Pt was given paperwork at the end of her visit and she says it was very confusing as to her medication instructions before CT. Need further instructions.

## 2022-02-23 NOTE — Telephone Encounter (Signed)
Spoke with pt, questions regarding medications and ct instructions answered.

## 2022-03-09 ENCOUNTER — Telehealth (HOSPITAL_COMMUNITY): Payer: Self-pay | Admitting: *Deleted

## 2022-03-09 NOTE — Telephone Encounter (Signed)
Reaching out to patient to offer assistance regarding upcoming cardiac imaging study; pt verbalizes understanding of appt date/time, parking situation and where to check in, pre-test NPO status and medications ordered, and verified current allergies; name and call back number provided for further questions should they arise  Chelsey Clement RN Navigator Cardiac Imaging [facility] [phone] office [phone] cell  Patient to hold her evening metoprolol succinate and take '100mg'$  metoprolol tartrate two hours prior to her cardiac CT scan.  She is aware to arrive at 8:30am.

## 2022-03-10 ENCOUNTER — Ambulatory Visit (HOSPITAL_COMMUNITY)
Admission: RE | Admit: 2022-03-10 | Discharge: 2022-03-10 | Disposition: A | Discharge status: Home / Self Care | Payer: PPO | Source: Ambulatory Visit | Attending: Cardiovascular Disease | Admitting: Cardiovascular Disease

## 2022-03-10 ENCOUNTER — Ambulatory Visit (HOSPITAL_BASED_OUTPATIENT_CLINIC_OR_DEPARTMENT_OTHER)
Admission: RE | Admit: 2022-03-10 | Discharge: 2022-03-10 | Disposition: A | Discharge status: Home / Self Care | Payer: PPO | Source: Ambulatory Visit | Attending: Cardiovascular Disease | Admitting: Cardiovascular Disease

## 2022-03-10 ENCOUNTER — Encounter (HOSPITAL_COMMUNITY): Payer: Self-pay

## 2022-03-10 ENCOUNTER — Ambulatory Visit (HOSPITAL_COMMUNITY)
Admission: RE | Admit: 2022-03-10 | Discharge: 2022-03-10 | Disposition: A | Discharge status: Home / Self Care | Payer: PPO | Source: Ambulatory Visit | Attending: Cardiology | Admitting: Cardiology

## 2022-03-10 ENCOUNTER — Other Ambulatory Visit: Payer: Self-pay | Admitting: Cardiovascular Disease

## 2022-03-10 DIAGNOSIS — R072 Precordial pain: Secondary | ICD-10-CM | POA: Diagnosis not present

## 2022-03-10 DIAGNOSIS — R931 Abnormal findings on diagnostic imaging of heart and coronary circulation: Secondary | ICD-10-CM

## 2022-03-10 DIAGNOSIS — I2510 Atherosclerotic heart disease of native coronary artery without angina pectoris: Secondary | ICD-10-CM

## 2022-03-10 MED ORDER — NITROGLYCERIN 0.4 MG SL SUBL
SUBLINGUAL_TABLET | SUBLINGUAL | Status: AC
  Filled 2022-03-10: qty 2

## 2022-03-10 MED ORDER — NITROGLYCERIN 0.4 MG SL SUBL
0.8000 mg | SUBLINGUAL_TABLET | Freq: Once | SUBLINGUAL | Status: AC
  Administered 2022-03-10: 0.8 mg via SUBLINGUAL

## 2022-03-10 MED ORDER — IOHEXOL 350 MG/ML SOLN
100.0000 mL | Freq: Once | INTRAVENOUS | Status: AC | PRN
  Administered 2022-03-10: 100 mL via INTRAVENOUS

## 2022-03-11 ENCOUNTER — Encounter: Payer: Self-pay | Admitting: Nurse Practitioner

## 2022-03-11 ENCOUNTER — Ambulatory Visit (INDEPENDENT_AMBULATORY_CARE_PROVIDER_SITE_OTHER): Payer: PPO | Admitting: Nurse Practitioner

## 2022-03-11 VITALS — BP 162/72 | HR 62 | Ht 68.0 in | Wt 250.0 lb

## 2022-03-11 DIAGNOSIS — E119 Type 2 diabetes mellitus without complications: Secondary | ICD-10-CM | POA: Diagnosis not present

## 2022-03-11 DIAGNOSIS — E785 Hyperlipidemia, unspecified: Secondary | ICD-10-CM

## 2022-03-11 DIAGNOSIS — R030 Elevated blood-pressure reading, without diagnosis of hypertension: Secondary | ICD-10-CM

## 2022-03-11 DIAGNOSIS — I25118 Atherosclerotic heart disease of native coronary artery with other forms of angina pectoris: Secondary | ICD-10-CM | POA: Diagnosis not present

## 2022-03-11 DIAGNOSIS — R072 Precordial pain: Secondary | ICD-10-CM | POA: Diagnosis not present

## 2022-03-11 NOTE — Patient Instructions (Addendum)
Medication Instructions:  Your physician recommends that you continue on your current medications as directed. Please refer to the Current Medication list given to you today.  *If you need a refill on your cardiac medications before your next appointment, please call your pharmacy*  Lab Work: NONE ordered at this time of appointment   If you have labs (blood work) drawn today and your tests are completely normal, you will receive your results only by: [place] (if you have MyChart) OR A paper copy in the mail If you have any lab test that is abnormal or we need to change your treatment, we will call you to review the results.  Testing/Procedures: NONE ordered at this time of appointment   Follow-Up: At [facility], you and your health needs are our priority.  As part of our continuing mission to provide you with exceptional heart care, we have created designated Provider Care Teams.  These Care Teams include your primary Cardiologist (physician) and Advanced Practice Providers (APPs -  Physician Assistants and Nurse Practitioners) who all work together to provide you with the care you need, when you need it.  Your next appointment:   1 month(s)  The format for your next appointment:   In Person  Provider:   Kirk Ruths, MD  or Diona Browner, NP  Other Instructions   Important Information About Sugar

## 2022-03-11 NOTE — Progress Notes (Signed)
Office Visit    Patient Name: [name] Date of Encounter: 03/11/2022  Primary Care Provider:  Binnie Rail, MD Primary Cardiologist:  Kirk Ruths, MD  Chief Complaint    76 year old female with a history of CAD, chest pain, hyperlipidemia, type 2 diabetes, GERD, clinical hypothyroidism, anxiety, depression, and venous insufficiency who presents for follow-up related to CAD.  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 ( Dr Tonia Brooms)   Subclinical hypothyroidism 05/17/2011   Venous insufficiency of right leg 06/23/2017   Follows with vascular   Weakness    Past Surgical History:  Procedure Laterality Date   CHOLECYSTECTOMY N/A 06/13/2017   Procedure: LAPAROSCOPIC CHOLECYSTECTOMY;  Surgeon: Vickie Epley, MD;  Location: [facility] ORS;  Service: General;  Laterality: N/A;   COLONOSCOPY     MASS EXCISION     right upper inner thigh or cyst   WISDOM TOOTH EXTRACTION      Allergies  Allergies  Allergen Reactions   Atorvastatin Other (See Comments)    REACTION: Upset stomach   Augmentin [Amoxicillin-Pot Clavulanate] Diarrhea    History of Present Illness    76 year old female with the above past medical history including CAD, chest pain, hyperlipidemia, type 2 diabetes, GERD, clinical hypothyroidism, anxiety, depression, and venous insufficiency.  She was previously evaluated by cardiology in 2010.  Nuclear study at the time showed EF 76%, no scar or ischemia.  She was referred by her PCP to Dr. Stanford Breed in May 2023 in the setting of chest pain concerning for angina.  She reported a 1 year history of chest tightness with  vigorous activity, associated back tightness.  Her symptoms were concerning for angina, coronary CTA was performed which revealed coronary calcium score of 75, 85th percentile for age, sex, race matched control, FFR CT positive obstructive CAD and mid RCA, referral for cardiac catheterization was recommended.  She presents today for follow-up accompanied by her significant other.  Since her last visit she has been stable from a cardiac standpoint.  She states she has not had any other episodes of chest tightness, back tightness she has not overly exerted herself.  She denies any other symptoms concerning for angina.  Overall, she reports feeling well however, she is concerned to know more about the results of her coronary CTA as well as further recommendations that she was told over the phone that she "may need a stent."  Home Medications    Current Outpatient Medications  Medication Sig Dispense Refill   aspirin 81 MG tablet Take 81 mg by mouth daily.     levothyroxine (SYNTHROID) 50 MCG tablet TAKE 1 TABLET BY MOUTH EVERY DAY 90 tablet 3   metoprolol succinate (TOPROL XL) 25 MG 24 hr tablet Take 1 tablet (25 mg total) by mouth daily. 90 tablet 3   omeprazole (PRILOSEC) 20 MG capsule Take 1 capsule (20 mg total) by mouth daily as needed. 90 capsule 3   rosuvastatin (CRESTOR) 20 MG tablet Take 1 tablet (20 mg total) by mouth daily. 90 tablet 3   No current facility-administered medications for  this visit.     Review of Systems    She denies chest pain, palpitations, dyspnea, pnd, orthopnea, n, v, dizziness, syncope, edema, weight gain, or early satiety. All other systems reviewed and are otherwise negative except as noted above.   Physical Exam    VS:  BP (!) 162/72   Pulse 62   Ht '5\' 8"'$  (1.727 m)   Wt 250 lb (113.4 kg)   SpO2 97%   BMI 38.01 kg/m  GEN: Well nourished, well developed, in no acute distress. HEENT: normal. Neck: Supple, no JVD, carotid bruits, or masses. Cardiac: RRR,  no murmurs, rubs, or gallops. No clubbing, cyanosis, edema.  Radials/DP/PT 2+ and equal bilaterally.  Respiratory:  Respirations regular and unlabored, clear to auscultation bilaterally. GI: Soft, nontender, nondistended, BS + x 4. MS: no deformity or atrophy. Skin: warm and dry, no rash. Neuro:  Strength and sensation are intact. Psych: Normal affect.  Accessory Clinical Findings    ECG personally reviewed by me today -NSR, 62 bpm- no acute changes.  Lab Results  Component Value Date   WBC 5.6 01/31/2022   HGB 12.6 01/31/2022   HCT 37.1 01/31/2022   MCV 89.7 01/31/2022   PLT 156.0 01/31/2022   Lab Results  Component Value Date   CREATININE 1.08 01/31/2022   BUN 19 01/31/2022   NA 139 01/31/2022   K 5.0 01/31/2022   CL 103 01/31/2022   CO2 30 01/31/2022   Lab Results  Component Value Date   ALT 19 01/31/2022   AST 19 01/31/2022   ALKPHOS 60 01/31/2022   BILITOT 0.4 01/31/2022   Lab Results  Component Value Date   CHOL 219 (H) 01/31/2022   HDL 64.00 01/31/2022   LDLCALC 136 (H) 01/31/2022   LDLDIRECT 152.1 06/26/2013   TRIG 92.0 01/31/2022   CHOLHDL 3 01/31/2022    Lab Results  Component Value Date   HGBA1C 5.8 01/31/2022    Assessment & Plan    1. CAD/chest pain: Recent coronary CTA revealed coronary calcium score of 420, 85th percentile for age, sex, race matched control, FFR CT positive obstructive CAD and mid RCA.  Further evaluation with cardiac catheterization was recommended.  Discussed this in detail with patient today as well as possible outcomes.  Upon discussing the potential risks associated with procedure, patient states that she needs more time to think about it before agreeing to proceed.  She stated she will contact our office if she decides that she would like to pursue LHC.  She denies any new symptoms concerning for angina.  Additionally, she declines antianginal therapy at this time.  Continue to monitor symptoms.  Discussed ED precautions.   Continue aspirin, Crestor.  2. Elevated BP reading: BP elevated in office today.  She is no longer taking metoprolol.  Continue to monitor BP at home and report BP consistently greater than 130/80.  Consider addition of amlodipine versus ACE/ARB if BP remains elevated.  Would favor amlodipine given antianginal benefit.  3. Hyperlipidemia: LDL was 22 in May 2023.  Continue aspirin, Crestor.  4. Type 2 diabetes: A1c was 10 in May 2023.  I do not see that she is on any medication at this time.  Recommend follow-up with PCP.  5. Disposition: Follow-up in 1 month.  Lenna Sciara, NP 03/11/2022, 4:12 PM

## 2022-03-14 ENCOUNTER — Ambulatory Visit: Payer: PPO | Admitting: Physician Assistant

## 2022-03-17 ENCOUNTER — Encounter: Payer: Self-pay | Admitting: *Deleted

## 2022-03-23 ENCOUNTER — Telehealth: Payer: Self-pay | Admitting: Internal Medicine

## 2022-03-23 ENCOUNTER — Telehealth: Payer: Self-pay

## 2022-03-23 DIAGNOSIS — Z85828 Personal history of other malignant neoplasm of skin: Secondary | ICD-10-CM | POA: Diagnosis not present

## 2022-03-23 DIAGNOSIS — L820 Inflamed seborrheic keratosis: Secondary | ICD-10-CM | POA: Diagnosis not present

## 2022-03-23 DIAGNOSIS — D485 Neoplasm of uncertain behavior of skin: Secondary | ICD-10-CM | POA: Diagnosis not present

## 2022-03-23 NOTE — Telephone Encounter (Signed)
PT visits today to drop off a form for Dr.Burns to fill out. Form has been left in provider's folder. PT would like to be notified when form is filled at  [facility]: [phone]

## 2022-03-23 NOTE — Telephone Encounter (Signed)
Please call pt to schedule or reschedule AWV. Thanks

## 2022-03-24 ENCOUNTER — Ambulatory Visit: Payer: PPO

## 2022-04-07 DIAGNOSIS — L03031 Cellulitis of right toe: Secondary | ICD-10-CM | POA: Diagnosis not present

## 2022-04-12 ENCOUNTER — Ambulatory Visit (INDEPENDENT_AMBULATORY_CARE_PROVIDER_SITE_OTHER): Payer: PPO

## 2022-04-12 VITALS — Ht 68.0 in | Wt 250.0 lb

## 2022-04-12 DIAGNOSIS — Z0000 Encounter for general adult medical examination without abnormal findings: Secondary | ICD-10-CM

## 2022-04-12 NOTE — Patient Instructions (Signed)
Ms. [name] , Thank you for taking time to come for your Medicare Wellness Visit. I appreciate your ongoing commitment to your health goals. Please review the following plan we discussed and let me know if I can assist you in the future.   These are the goals we discussed:  Goals       Client understands the importance of follow-up with providers by attending scheduled visits      Increase physical activity (pt-stated)      Reduce medication and lose weight      Patient Stated (pt-stated)      My goal is to lose 50 pounds by the end of the year. By changing diet and increasing activity.        This is a list of the screening recommended for you and due dates:  Health Maintenance  Topic Date Due   Yearly kidney health urinalysis for diabetes  06/26/2014   COVID-19 Vaccine (3 - Pfizer series) 08/06/2020   Zoster (Shingles) Vaccine (1 of 2) 05/05/2022*   Tetanus Vaccine  02/03/2023*   Flu Shot  04/26/2022   Yearly kidney function blood test for diabetes  02/01/2023   DEXA scan (bone density measurement)  07/26/2023   Mammogram  09/15/2023   Colon Cancer Screening  06/05/2029   Pneumonia Vaccine  Completed   Hepatitis C Screening: USPSTF Recommendation to screen - Ages 18-79 yo.  Completed   HPV Vaccine  Aged Out  *Topic was postponed. The date shown is not the original due date.    Advanced directives: No   Conditions/risks identified: None  Next appointment: Follow up in one year for your annual wellness visit     Preventive Care 65 Years and Older, Female Preventive care refers to lifestyle choices and visits with your health care provider that can promote health and wellness. What does preventive care include? A yearly physical exam. This is also called an annual well check. Dental exams once or twice a year. Routine eye exams. Ask your health care provider how often you should have your eyes checked. Personal lifestyle choices, including: Daily care of your  teeth and gums. Regular physical activity. Eating a healthy diet. Avoiding tobacco and drug use. Limiting alcohol use. Practicing safe sex. Taking low-dose aspirin every day. Taking vitamin and mineral supplements as recommended by your health care provider. What happens during an annual well check? The services and screenings done by your health care provider during your annual well check will depend on your age, overall health, lifestyle risk factors, and family history of disease. Counseling  Your health care provider may ask you questions about your: Alcohol use. Tobacco use. Drug use. Emotional well-being. Home and relationship well-being. Sexual activity. Eating habits. History of falls. Memory and ability to understand (cognition). Work and work Statistician. Reproductive health. Screening  You may have the following tests or measurements: Height, weight, and BMI. Blood pressure. Lipid and cholesterol levels. These may be checked every 5 years, or more frequently if you are over 85 years old. Skin check. Lung cancer screening. You may have this screening every year starting at age 40 if you have a 30-pack-year history of smoking and currently smoke or have quit within the past 15 years. Fecal occult blood test (FOBT) of the stool. You may have this test every year starting at age 61. Flexible sigmoidoscopy or colonoscopy. You may have a sigmoidoscopy every 5 years or a colonoscopy every 10 years starting at age 37. Hepatitis C blood  test. Hepatitis B blood test. Sexually transmitted disease (STD) testing. Diabetes screening. This is done by checking your blood sugar (glucose) after you have not eaten for a while (fasting). You may have this done every 1-3 years. Bone density scan. This is done to screen for osteoporosis. You may have this done starting at age 62. Mammogram. This may be done every 1-2 years. Talk to your health care provider about how often you should have  regular mammograms. Talk with your health care provider about your test results, treatment options, and if necessary, the need for more tests. Vaccines  Your health care provider may recommend certain vaccines, such as: Influenza vaccine. This is recommended every year. Tetanus, diphtheria, and acellular pertussis (Tdap, Td) vaccine. You may need a Td booster every 10 years. Zoster vaccine. You may need this after age 41. Pneumococcal 13-valent conjugate (PCV13) vaccine. One dose is recommended after age 52. Pneumococcal polysaccharide (PPSV23) vaccine. One dose is recommended after age 52. Talk to your health care provider about which screenings and vaccines you need and how often you need them. This information is not intended to replace advice given to you by your health care provider. Make sure you discuss any questions you have with your health care provider. Document Released: 10/09/2015 Document Revised: 06/01/2016 Document Reviewed: 07/14/2015 Elsevier Interactive Patient Education  2017 [employer] Prevention in the Home Falls can cause injuries. They can happen to people of all ages. There are many things you can do to make your home safe and to help prevent falls. What can I do on the outside of my home? Regularly fix the edges of walkways and driveways and fix any cracks. Remove anything that might make you trip as you walk through a door, such as a raised step or threshold. Trim any bushes or trees on the path to your home. Use bright outdoor lighting. Clear any walking paths of anything that might make someone trip, such as rocks or tools. Regularly check to see if handrails are loose or broken. Make sure that both sides of any steps have handrails. Any raised decks and porches should have guardrails on the edges. Have any leaves, snow, or ice cleared regularly. Use sand or salt on walking paths during winter. Clean up any spills in your garage right away. This  includes oil or grease spills. What can I do in the bathroom? Use night lights. Install grab bars by the toilet and in the tub and shower. Do not use towel bars as grab bars. Use non-skid mats or decals in the tub or shower. If you need to sit down in the shower, use a plastic, non-slip stool. Keep the floor dry. Clean up any water that spills on the floor as soon as it happens. Remove soap buildup in the tub or shower regularly. Attach bath mats securely with double-sided non-slip rug tape. Do not have throw rugs and other things on the floor that can make you trip. What can I do in the bedroom? Use night lights. Make sure that you have a light by your bed that is easy to reach. Do not use any sheets or blankets that are too big for your bed. They should not hang down onto the floor. Have a firm chair that has side arms. You can use this for support while you get dressed. Do not have throw rugs and other things on the floor that can make you trip. What can I do in the kitchen? Clean  up any spills right away. Avoid walking on wet floors. Keep items that you use a lot in easy-to-reach places. If you need to reach something above you, use a strong step stool that has a grab bar. Keep electrical cords out of the way. Do not use floor polish or wax that makes floors slippery. If you must use wax, use non-skid floor wax. Do not have throw rugs and other things on the floor that can make you trip. What can I do with my stairs? Do not leave any items on the stairs. Make sure that there are handrails on both sides of the stairs and use them. Fix handrails that are broken or loose. Make sure that handrails are as long as the stairways. Check any carpeting to make sure that it is firmly attached to the stairs. Fix any carpet that is loose or worn. Avoid having throw rugs at the top or bottom of the stairs. If you do have throw rugs, attach them to the floor with carpet tape. Make sure that you  have a light switch at the top of the stairs and the bottom of the stairs. If you do not have them, ask someone to add them for you. What else can I do to help prevent falls? Wear shoes that: Do not have high heels. Have rubber bottoms. Are comfortable and fit you well. Are closed at the toe. Do not wear sandals. If you use a stepladder: Make sure that it is fully opened. Do not climb a closed stepladder. Make sure that both sides of the stepladder are locked into place. Ask someone to hold it for you, if possible. Clearly mark and make sure that you can see: Any grab bars or handrails. First and last steps. Where the edge of each step is. Use tools that help you move around (mobility aids) if they are needed. These include: Canes. Walkers. Scooters. Crutches. Turn on the lights when you go into a dark area. Replace any light bulbs as soon as they burn out. Set up your furniture so you have a clear path. Avoid moving your furniture around. If any of your floors are uneven, fix them. If there are any pets around you, be aware of where they are. Review your medicines with your doctor. Some medicines can make you feel dizzy. This can increase your chance of falling. Ask your doctor what other things that you can do to help prevent falls. This information is not intended to replace advice given to you by your health care provider. Make sure you discuss any questions you have with your health care provider. Document Released: 07/09/2009 Document Revised: 02/18/2016 Document Reviewed: 10/17/2014 Elsevier Interactive Patient Education  2017 [employer].

## 2022-04-12 NOTE — Progress Notes (Signed)
Subjective:   [name] is a 76 y.o. female who presents for Medicare Annual (Subsequent) preventive examination.  Review of Systems    Virtual Visit via Telephone Note  I connected with  [name] on 04/12/22 at  9:45 AM EDT by telephone and verified that I am speaking with the correct person using two identifiers.  Location: Patient: Home Provider: Office Persons participating in the virtual visit: patient/Nurse Health Advisor   I discussed the limitations, risks, security and privacy concerns of performing an evaluation and management service by telephone and the availability of in person appointments. The patient expressed understanding and agreed to proceed.  Interactive audio and video telecommunications were attempted between this nurse and patient, however failed, due to patient having technical difficulties OR patient did not have access to video capability.  We continued and completed visit with audio only.  Some vital signs may be absent or patient reported.   Chelsey Peaches, LPN  Cardiac Risk Factors include: advanced age (>47mn, >>71women)     Objective:    Today's Vitals   04/12/22 0949  Weight: 250 lb (113.4 kg)  Height: '5\' 8"'$  (1.727 m)   Body mass index is 38.01 kg/m.     04/12/2022   10:02 AM 03/23/2021    2:16 PM 02/25/2020    7:12 PM 01/28/2020    8:14 AM 06/13/2017    5:53 AM  Advanced Directives  Does Patient Have a Medical Advance Directive? No No No Yes Yes  Type of AScientist, research (medical)Living will Living will;Healthcare Power of Attorney  Does patient want to make changes to medical advance directive?    No - Patient declined No - Patient declined  Copy of [place]?    No - copy requested No - copy requested  Would patient like information on creating a medical advance directive? No - Patient declined No - Patient declined No - Patient declined      Current  Medications (verified) Outpatient Encounter Medications as of 04/12/2022  Medication Sig   aspirin 81 MG tablet Take 81 mg by mouth daily.   levothyroxine (SYNTHROID) 50 MCG tablet TAKE 1 TABLET BY MOUTH EVERY DAY   metoprolol succinate (TOPROL XL) 25 MG 24 hr tablet Take 1 tablet (25 mg total) by mouth daily.   omeprazole (PRILOSEC) 20 MG capsule Take 1 capsule (20 mg total) by mouth daily as needed.   rosuvastatin (CRESTOR) 20 MG tablet Take 1 tablet (20 mg total) by mouth daily.   No facility-administered encounter medications on file as of 04/12/2022.    Allergies (verified) Atorvastatin and Augmentin [amoxicillin-pot clavulanate]   History: 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 ( Dr GTonia Brooms   Subclinical hypothyroidism 05/17/2011   Venous insufficiency of right leg 06/23/2017   Follows with vascular   Weakness    Past Surgical History:  Procedure Laterality Date   CHOLECYSTECTOMY N/A 06/13/2017   Procedure: LAPAROSCOPIC CHOLECYSTECTOMY;  Surgeon: DVickie Epley MD;  Location: [facility] ORS;  Service: General;  Laterality: N/A;  COLONOSCOPY     MASS EXCISION     right upper inner thigh or cyst   WISDOM TOOTH EXTRACTION     Family History  Problem Relation Age of Onset   COPD Mother        deceased 2008-10-16   Alzheimer's disease Father        deceased 16-Aug-2009   Coronary artery disease Father        s/p cabg   Breast cancer Maternal Grandmother    Colon cancer Neg Hx    Colon polyps Neg Hx    Esophageal cancer Neg Hx    Rectal cancer Neg Hx    Stomach cancer Neg Hx    Social History   Socioeconomic History   Marital status: Widowed    Spouse name: Not on  file   Number of children: 0   Years of education: 60   Highest education level: 12th grade  Occupational History   Not on file  Tobacco Use   Smoking status: Never   Smokeless tobacco: Never  Vaping Use   Vaping Use: Never used  Substance and Sexual Activity   Alcohol use: No   Drug use: No   Sexual activity: Not on file  Other Topics Concern   Not on file  Social History Narrative   Retired Merchandiser, retail for [employer]   Widowed (husband - Eddie Dibbles), no children   Never Smoked    Alcohol use-no      Daily Caffeine Use 2/day   Social Determinants of Health   Financial Resource Strain: Low Risk  (04/12/2022)   Overall Financial Resource Strain (CARDIA)    Difficulty of Paying Living Expenses: Not hard at all  Food Insecurity: No Food Insecurity (04/12/2022)   Hunger Vital Sign    Worried About Running Out of Food in the Last Year: Never true    Ran Out of Food in the Last Year: Never true  Transportation Needs: No Transportation Needs (04/12/2022)   PRAPARE - Hydrologist (Medical): No    Lack of Transportation (Non-Medical): No  Physical Activity: Insufficiently Active (04/12/2022)   Exercise Vital Sign    Days of Exercise per Week: 3 days    Minutes of Exercise per Session: 30 min  Stress: No Stress Concern Present (04/12/2022)   [place]    Feeling of Stress : Not at all  Social Connections: Moderately Integrated (04/12/2022)   Social Connection and Isolation Panel [NHANES]    Frequency of Communication with Friends and Family: More than three times a week    Frequency of Social Gatherings with Friends and Family: More than three times a week    Attends Religious Services: More than 4 times per year    Active Member of [employer] or Organizations: Yes    Attends Archivist Meetings: More than 4 times per year    Marital Status: Widowed    Tobacco  Counseling Counseling given: Not Answered   Clinical Intake:  Pre-visit preparation completed: No Diabetic?  No  Interpreter Needed?: NoActivities of Daily Living    04/12/2022    9:59 AM  In your present state of health, do you have any difficulty performing the following activities:  Hearing? 0  Vision? 0  Difficulty concentrating or making decisions? 0  Walking or climbing stairs? 0  Dressing or bathing? 0  Doing errands, shopping? 0  Preparing Food and eating ? N  Using the Toilet? N  In the past six months, have you accidently leaked urine? N  Do you have problems with loss of bowel control? N  Managing your Medications? N  Managing your Finances? N  Housekeeping or managing your Housekeeping? N    Patient Care Team: Binnie Rail, MD as PCP - General (Internal Medicine) Stanford Breed Denice Bors, MD as PCP - Cardiology (Cardiology) Vevelyn Royals, MD as Consulting Physician (Ophthalmology) Lorelle Gibbs, MD (Radiology)  Indicate any recent Medical Services you may have received from other than [facility] providers in the past year (date may be approximate).     Assessment:   This is a routine wellness examination for Shritha.  Hearing/Vision screen Hearing Screening - Comments:: No hearing difficulty Vision Screening - Comments:: Wears glasses. Followed by Dr Lucita Ferrara  Dietary issues and exercise activities discussed: Exercise limited by: None identified   Goals Addressed               This Visit's Progress     Increase physical activity (pt-stated)        Reduce medication and lose weight       Depression Screen    04/12/2022    9:57 AM 03/23/2021    2:45 PM 04/15/2020    3:19 PM 01/28/2020    8:14 AM 01/01/2020    7:59 AM 07/01/2019    9:10 AM 07/01/2019    7:53 AM  PHQ 2/9 Scores  PHQ - 2 Score 0 0 0 0  0 0  PHQ- 9 Score   0   1   Exception Documentation     Patient refusal      Fall Risk    04/12/2022    9:59 AM 03/23/2021    2:45 PM  01/28/2020    8:14 AM 01/01/2020    7:58 AM 06/23/2017    9:23 AM  [place] in the past year? 0 1 0 0 No  Number falls in past yr: 0 0 0 0   Injury with Fall? 0 0 0 0   Risk for fall due to : No Fall Risks Orthopedic patient No Fall Risks    Follow up  Falls evaluation completed Falls evaluation completed;Education provided;Falls prevention discussed Falls evaluation completed     FALL RISK PREVENTION PERTAINING TO THE HOME:  Any stairs in or around the home? No  If so, are there any without handrails? No  Home free of loose throw rugs in walkways, pet beds, electrical cords, etc? Yes  Adequate lighting in your home to reduce risk of falls?  Yes  ASSISTIVE DEVICES UTILIZED TO PREVENT FALLS:  Life alert? No  Use of a cane, walker or w/c? No  Grab bars in the bathroom? Yes  Shower chair or bench in shower? Yes  Elevated toilet seat or a handicapped toilet? Yes   TIMED UP AND GO:  Was the test performed? No . Audio Visit  Cognitive Function:        04/12/2022   10:03 AM 01/28/2020    8:15 AM  6CIT Screen  What Year? 0 points 0 points  What month? 0 points 0 points  What time? 0 points 0 points  Count back from 20 0 points 0 points  Months in reverse 0 points 0 points  Repeat phrase 0 points 0 points  Total Score 0 points 0 points    Immunizations Immunization History  Administered Date(s) Administered   Influenza Whole 06/26/2008, 07/02/2009  Influenza, High Dose Seasonal PF 06/23/2017, 07/26/2018, 06/28/2019   Influenza-Unspecified 06/07/2016   PFIZER(Purple Top)SARS-COV-2 Vaccination 05/21/2020, 06/11/2020   Pneumococcal Conjugate-13 06/23/2017   Pneumococcal Polysaccharide-23 07/09/2013    Pneumococcal vaccine status: Up to date  Covid-19 vaccine status: Completed vaccines  Qualifies for Shingles Vaccine? Yes   Zostavax completed No   Shingrix Completed?: No.    Education has been provided regarding the importance of this vaccine. Patient has been  advised to call insurance company to determine out of pocket expense if they have not yet received this vaccine. Advised may also receive vaccine at local pharmacy or Health Dept. Verbalized acceptance and understanding.  Screening Tests Health Maintenance  Topic Date Due   Diabetic kidney evaluation - Urine ACR  06/26/2014   COVID-19 Vaccine (3 - Pfizer series) 04/28/2022 (Originally 08/06/2020)   Zoster Vaccines- Shingrix (1 of 2) 05/05/2022 (Originally 10/01/1997)   TETANUS/TDAP  02/03/2023 (Originally 10/01/1966)   INFLUENZA VACCINE  04/26/2022   Diabetic kidney evaluation - GFR measurement  02/01/2023   DEXA SCAN  07/26/2023   MAMMOGRAM  09/15/2023   COLONOSCOPY (Pts 45-57yr Insurance coverage will need to be confirmed)  06/05/2029   Pneumonia Vaccine 76 Years old  Completed   Hepatitis C Screening  Completed   HPV VACCINES  Aged Out    Health Maintenance  Health Maintenance Due  Topic Date Due   Diabetic kidney evaluation - Urine ACR  06/26/2014    Colorectal cancer screening: Type of screening: Colonoscopy. Completed 06/06/19. Repeat every 10 years  Mammogram status: Completed 09/14/21. Repeat every year  Bone Density status: Completed 07/25/18. Results reflect: Bone density results: NORMAL. Repeat every 07/25/18 years.  Lung Cancer Screening: (Low Dose CT Chest recommended if Age 74-80years, 30 pack-year currently smoking OR have quit w/in 15years.) does not qualify.     Additional Screening:  Hepatitis C Screening: does qualify; Completed 06/22/16  Vision Screening: Recommended annual ophthalmology exams for early detection of glaucoma and other disorders of the eye. Is the patient up to date with their annual eye exam?  Yes  Who is the provider or what is the name of the office in which the patient attends annual eye exams? Dr SLucita FerraraIf pt is not established with a provider, would they like to be referred to a provider to establish care? No .   Dental  Screening: Recommended annual dental exams for proper oral hygiene  Community Resource Referral / Chronic Care Management:  CRR required this visit?  No   CCM required this visit?  No      Plan:     I have personally reviewed and noted the following in the patient's chart:   Medical and social history Use of alcohol, tobacco or illicit drugs  Current medications and supplements including opioid prescriptions.  Functional ability and status Nutritional status Physical activity Advanced directives List of other physicians Hospitalizations, surgeries, and ER visits in previous 12 months Vitals Screenings to include cognitive, depression, and falls Referrals and appointments  In addition, I have reviewed and discussed with patient certain preventive protocols, quality metrics, and best practice recommendations. A written personalized care plan for preventive services as well as general preventive health recommendations were provided to patient.     BCriselda Peaches LPN   [id number]  Nurse Notes: Patient due labs Urine ACR

## 2022-04-13 NOTE — Progress Notes (Signed)
Cardiology Clinic Note   Patient Name: [name] Date of Encounter: 04/14/2022  Primary Care Provider:  Binnie Rail, MD Primary Cardiologist:  Kirk Ruths, MD  Patient Profile    76 year old female with a history of CAD, chest pain, hyperlipidemia, type 2 diabetes, GERD, clinical hypothyroidism, anxiety, depression, and venous insufficiency who presents for follow-up related to CAD.  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 ( Dr Tonia Brooms)   Subclinical hypothyroidism 05/17/2011   Venous insufficiency of right leg 06/23/2017   Follows with vascular   Weakness    Past Surgical History:  Procedure Laterality Date   CHOLECYSTECTOMY N/A 06/13/2017   Procedure: LAPAROSCOPIC CHOLECYSTECTOMY;  Surgeon: Vickie Epley, MD;  Location: [facility] ORS;  Service: General;  Laterality: N/A;   COLONOSCOPY     MASS EXCISION     right upper inner thigh or cyst   WISDOM TOOTH EXTRACTION      Allergies  Allergies  Allergen Reactions   Atorvastatin Other (See Comments)    REACTION: Upset stomach   Augmentin [Amoxicillin-Pot Clavulanate] Diarrhea    History of Present Illness    76 year old female with the above past medical history including CAD, chest pain, hyperlipidemia, type 2 diabetes, GERD, clinical hypothyroidism, anxiety, depression, and venous insufficiency.   She was previously evaluated by cardiology in 2010.  Nuclear study at the time showed EF 76%, no scar or ischemia.  She was referred by her PCP to Dr. Stanford Breed in May 2023 in the setting of chest pain concerning for angina.  She reported a 1 year history of chest tightness  with vigorous activity, associated back tightness.  Her symptoms were concerning for angina, coronary CTA was performed which revealed coronary calcium score of 25, 85th percentile for age, sex, race matched control, FFR CT positive obstructive CAD and mid RCA, referral for cardiac catheterization was recommended.   She was seen in the office on 03/11/2022 and was stable from a cardiac standpoint.  She denies symptoms concerning for angina.  After discussing cardiac catheterization procedure in detail including potential risks associated with the procedure, patient declined cardiac catheterization as well as antianginal therapy stating she needed more time to think about it.  She presents today for follow-up accompanied by her friend. Since her last visit she has been stable from a cardiac standpoint.  Denies any symptoms concerning for angina, though she has not been particularly active over the past month.  She has had time to think about the recommendation for cardiac catheterization and is willing to proceed.  She is anxious about the procedure but otherwise, she has been feeling well and denies any new concerns today.  Home Medications    Current Outpatient Medications  Medication Sig Dispense Refill   aspirin 81 MG tablet Take 81 mg by mouth daily.     levothyroxine (SYNTHROID) 50 MCG tablet TAKE 1 TABLET BY MOUTH EVERY DAY 90 tablet 3   metoprolol succinate (TOPROL XL) 25 MG 24 hr tablet Take 1 tablet (25 mg total) by mouth daily. 90 tablet 3   omeprazole (PRILOSEC)  20 MG capsule Take 1 capsule (20 mg total) by mouth daily as needed. 90 capsule 3   rosuvastatin (CRESTOR) 20 MG tablet Take 1 tablet (20 mg total) by mouth daily. 90 tablet 3   Current Facility-Administered Medications  Medication Dose Route Frequency Provider Last Rate Last Admin   sodium chloride flush (NS) 0.9 % injection 3 mL  3 mL Intravenous Q12H Lenna Sciara, NP         Family History    Family History  Problem  Relation Age of Onset   COPD Mother        deceased October 10, 2008   Alzheimer's disease Father        deceased 2009-08-10   Coronary artery disease Father        s/p cabg   Breast cancer Maternal Grandmother    Colon cancer Neg Hx    Colon polyps Neg Hx    Esophageal cancer Neg Hx    Rectal cancer Neg Hx    Stomach cancer Neg Hx    She indicated that her mother is deceased. She indicated that her father is deceased. She indicated that the status of her maternal grandmother is unknown. She indicated that the status of her neg hx is unknown.  Social History    Social History   Socioeconomic History   Marital status: Widowed    Spouse name: Not on file   Number of children: 0   Years of education: 26   Highest education level: 12th grade  Occupational History   Not on file  Tobacco Use   Smoking status: Never   Smokeless tobacco: Never  Vaping Use   Vaping Use: Never used  Substance and Sexual Activity   Alcohol use: No   Drug use: No   Sexual activity: Not on file  Other Topics Concern   Not on file  Social History Narrative   Retired Merchandiser, retail for [employer]   Widowed (husband - Eddie Dibbles), no children   Never Smoked    Alcohol use-no      Daily Caffeine Use 2/day   Social Determinants of Health   Financial Resource Strain: Low Risk  (04/12/2022)   Overall Financial Resource Strain (CARDIA)    Difficulty of Paying Living Expenses: Not hard at all  Food Insecurity: No Food Insecurity (04/12/2022)   Hunger Vital Sign    Worried About Running Out of Food in the Last Year: Never true    Ran Out of Food in the Last Year: Never true  Transportation Needs: No Transportation Needs (04/12/2022)   PRAPARE - Hydrologist (Medical): No    Lack of Transportation (Non-Medical): No  Physical Activity: Insufficiently Active (04/12/2022)   Exercise Vital Sign    Days of Exercise per Week: 3 days    Minutes of Exercise per Session: 30 min   Stress: No Stress Concern Present (04/12/2022)   [place]    Feeling of Stress : Not at all  Social Connections: Moderately Integrated (04/12/2022)   Social Connection and Isolation Panel [NHANES]    Frequency of Communication with Friends and Family: More than three times a week    Frequency of Social Gatherings with Friends and Family: More than three times a week    Attends Religious Services: More than 4 times per year    Active Member of [employer] or Organizations: Yes    Attends Archivist Meetings: More  than 4 times per year    Marital Status: Widowed  Intimate Partner Violence: Not At Risk (04/12/2022)   Humiliation, Afraid, Rape, and Kick questionnaire    Fear of Current or Ex-Partner: No    Emotionally Abused: No    Physically Abused: No    Sexually Abused: No     Review of Systems    She denies chest pain, palpitations, dyspnea, pnd, orthopnea, n, v, dizziness, syncope, edema, weight gain, or early satiety. All other systems reviewed and are otherwise negative except as noted above.   Physical Exam    VS:  BP 126/78   Pulse (!) 59   Ht '5\' 8"'$  (1.727 m)   Wt 249 lb (112.9 kg)   SpO2 97%   BMI 37.86 kg/m   GEN: Well nourished, well developed, in no acute distress. HEENT: normal. Neck: Supple, no JVD, carotid bruits, or masses. Cardiac: RRR, no murmurs, rubs, or gallops. No clubbing, cyanosis, edema.  Radials/DP/PT 2+ and equal bilaterally.  Respiratory:  Respirations regular and unlabored, clear to auscultation bilaterally. GI: Soft, nontender, nondistended, BS + x 4. MS: no deformity or atrophy. Skin: warm and dry, no rash. Neuro:  Strength and sensation are intact. Psych: Normal affect.  Accessory Clinical Findings    ECG personally reviewed by me today- Sinus bradycardia, 55 bpm.  Lab Results  Component Value Date   WBC 5.6 01/31/2022   HGB 12.6 01/31/2022   HCT 37.1 01/31/2022    MCV 89.7 01/31/2022   PLT 156.0 01/31/2022   Lab Results  Component Value Date   CREATININE 1.08 01/31/2022   BUN 19 01/31/2022   NA 139 01/31/2022   K 5.0 01/31/2022   CL 103 01/31/2022   CO2 30 01/31/2022   Lab Results  Component Value Date   ALT 19 01/31/2022   AST 19 01/31/2022   ALKPHOS 60 01/31/2022   BILITOT 0.4 01/31/2022   Lab Results  Component Value Date   CHOL 219 (H) 01/31/2022   HDL 64.00 01/31/2022   LDLCALC 136 (H) 01/31/2022   LDLDIRECT 152.1 06/26/2013   TRIG 92.0 01/31/2022   CHOLHDL 3 01/31/2022    Lab Results  Component Value Date   HGBA1C 5.8 01/31/2022    Assessment & Plan   1. CAD/chest pain: Recent coronary CTA revealed coronary calcium score e of 57, 85th percentile for age, sex, race matched control, FFR CT positive obstructive CAD and mid RCA.  Further evaluation with cardiac catheterization was recommended. Discussed procedure in detail at last visit however, patient stated she needed more time to think about it before agreeing to proceed.  She denies any recent symptoms concerning for angina though she has been limiting her activity overall.  She is now ready to proceed with LHC.  Discussed with Dr. Harl Bowie, DOD, and through shared decision making, will proceed with LHC.  She is scheduled for first case on 04/25/2022 with Dr. Claiborne Billings.  Will notify Dr. Stanford Breed, primary cardiologist, as well. Discussed ED precautions.  Continue aspirin, Crestor.  Will update EKG, labs today. Plan for follow-up 2 weeks post cath.   Shared Decision Making/Informed Consent The risks [stroke (1 in 1000), death (1 in 1000), kidney failure [usually temporary] (1 in 500), bleeding (1 in 200), allergic reaction [possibly serious] (1 in 200)], benefits (diagnostic support and management of coronary artery disease) and alternatives of a cardiac catheterization were discussed in detail with Ms. Crenshaw [name] and she is willing to proceed.  2. Elevated BP reading: BP well  controlled. Continue current antihypertensive regimen.    3. Hyperlipidemia: LDL was 22 in May 2023.  Continue aspirin, Crestor.   4. Type 2 diabetes: A1c was 10 in May 2023.  I do not see that she is on any medication at this time.  Recommend follow-up with PCP.   5. Disposition: Follow-up 2 weeks post-cath.   Lenna Sciara, NP 04/14/2022, 10:33 AM

## 2022-04-13 NOTE — H&P (View-Only) (Signed)
Cardiology Clinic Note   Patient Name: [name] Date of Encounter: 04/14/2022  Primary Care Provider:  Binnie Rail, MD Primary Cardiologist:  Kirk Ruths, MD  Patient Profile    76 year old female with a history of CAD, chest pain, hyperlipidemia, type 2 diabetes, GERD, clinical hypothyroidism, anxiety, depression, and venous insufficiency who presents for follow-up related to CAD.  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 ( Dr Tonia Brooms)   Subclinical hypothyroidism 05/17/2011   Venous insufficiency of right leg 06/23/2017   Follows with vascular   Weakness    Past Surgical History:  Procedure Laterality Date   CHOLECYSTECTOMY N/A 06/13/2017   Procedure: LAPAROSCOPIC CHOLECYSTECTOMY;  Surgeon: Vickie Epley, MD;  Location: [facility] ORS;  Service: General;  Laterality: N/A;   COLONOSCOPY     MASS EXCISION     right upper inner thigh or cyst   WISDOM TOOTH EXTRACTION      Allergies  Allergies  Allergen Reactions   Atorvastatin Other (See Comments)    REACTION: Upset stomach   Augmentin [Amoxicillin-Pot Clavulanate] Diarrhea    History of Present Illness    76 year old female with the above past medical history including CAD, chest pain, hyperlipidemia, type 2 diabetes, GERD, clinical hypothyroidism, anxiety, depression, and venous insufficiency.   She was previously evaluated by cardiology in 2010.  Nuclear study at the time showed EF 76%, no scar or ischemia.  She was referred by her PCP to Dr. Stanford Breed in May 2023 in the setting of chest pain concerning for angina.  She reported a 1 year history of chest tightness  with vigorous activity, associated back tightness.  Her symptoms were concerning for angina, coronary CTA was performed which revealed coronary calcium score of 62, 85th percentile for age, sex, race matched control, FFR CT positive obstructive CAD and mid RCA, referral for cardiac catheterization was recommended.   She was seen in the office on 03/11/2022 and was stable from a cardiac standpoint.  She denies symptoms concerning for angina.  After discussing cardiac catheterization procedure in detail including potential risks associated with the procedure, patient declined cardiac catheterization as well as antianginal therapy stating she needed more time to think about it.  She presents today for follow-up accompanied by her friend. Since her last visit she has been stable from a cardiac standpoint.  Denies any symptoms concerning for angina, though she has not been particularly active over the past month.  She has had time to think about the recommendation for cardiac catheterization and is willing to proceed.  She is anxious about the procedure but otherwise, she has been feeling well and denies any new concerns today.  Home Medications    Current Outpatient Medications  Medication Sig Dispense Refill   aspirin 81 MG tablet Take 81 mg by mouth daily.     levothyroxine (SYNTHROID) 50 MCG tablet TAKE 1 TABLET BY MOUTH EVERY DAY 90 tablet 3   metoprolol succinate (TOPROL XL) 25 MG 24 hr tablet Take 1 tablet (25 mg total) by mouth daily. 90 tablet 3   omeprazole (PRILOSEC)  20 MG capsule Take 1 capsule (20 mg total) by mouth daily as needed. 90 capsule 3   rosuvastatin (CRESTOR) 20 MG tablet Take 1 tablet (20 mg total) by mouth daily. 90 tablet 3   Current Facility-Administered Medications  Medication Dose Route Frequency Provider Last Rate Last Admin   sodium chloride flush (NS) 0.9 % injection 3 mL  3 mL Intravenous Q12H Lenna Sciara, NP         Family History    Family History  Problem  Relation Age of Onset   COPD Mother        deceased 10-30-2008   Alzheimer's disease Father        deceased August 30, 2009   Coronary artery disease Father        s/p cabg   Breast cancer Maternal Grandmother    Colon cancer Neg Hx    Colon polyps Neg Hx    Esophageal cancer Neg Hx    Rectal cancer Neg Hx    Stomach cancer Neg Hx    She indicated that her mother is deceased. She indicated that her father is deceased. She indicated that the status of her maternal grandmother is unknown. She indicated that the status of her neg hx is unknown.  Social History    Social History   Socioeconomic History   Marital status: Widowed    Spouse name: Not on file   Number of children: 0   Years of education: 59   Highest education level: 12th grade  Occupational History   Not on file  Tobacco Use   Smoking status: Never   Smokeless tobacco: Never  Vaping Use   Vaping Use: Never used  Substance and Sexual Activity   Alcohol use: No   Drug use: No   Sexual activity: Not on file  Other Topics Concern   Not on file  Social History Narrative   Retired Merchandiser, retail for [employer]   Widowed (husband - Eddie Dibbles), no children   Never Smoked    Alcohol use-no      Daily Caffeine Use 2/day   Social Determinants of Health   Financial Resource Strain: Low Risk  (04/12/2022)   Overall Financial Resource Strain (CARDIA)    Difficulty of Paying Living Expenses: Not hard at all  Food Insecurity: No Food Insecurity (04/12/2022)   Hunger Vital Sign    Worried About Running Out of Food in the Last Year: Never true    Ran Out of Food in the Last Year: Never true  Transportation Needs: No Transportation Needs (04/12/2022)   PRAPARE - Hydrologist (Medical): No    Lack of Transportation (Non-Medical): No  Physical Activity: Insufficiently Active (04/12/2022)   Exercise Vital Sign    Days of Exercise per Week: 3 days    Minutes of Exercise per Session: 30 min   Stress: No Stress Concern Present (04/12/2022)   [place]    Feeling of Stress : Not at all  Social Connections: Moderately Integrated (04/12/2022)   Social Connection and Isolation Panel [NHANES]    Frequency of Communication with Friends and Family: More than three times a week    Frequency of Social Gatherings with Friends and Family: More than three times a week    Attends Religious Services: More than 4 times per year    Active Member of [employer] or Organizations: Yes    Attends Archivist Meetings: More  than 4 times per year    Marital Status: Widowed  Intimate Partner Violence: Not At Risk (04/12/2022)   Humiliation, Afraid, Rape, and Kick questionnaire    Fear of Current or Ex-Partner: No    Emotionally Abused: No    Physically Abused: No    Sexually Abused: No     Review of Systems    She denies chest pain, palpitations, dyspnea, pnd, orthopnea, n, v, dizziness, syncope, edema, weight gain, or early satiety. All other systems reviewed and are otherwise negative except as noted above.   Physical Exam    VS:  BP 126/78   Pulse (!) 59   Ht '5\' 8"'$  (1.727 m)   Wt 249 lb (112.9 kg)   SpO2 97%   BMI 37.86 kg/m   GEN: Well nourished, well developed, in no acute distress. HEENT: normal. Neck: Supple, no JVD, carotid bruits, or masses. Cardiac: RRR, no murmurs, rubs, or gallops. No clubbing, cyanosis, edema.  Radials/DP/PT 2+ and equal bilaterally.  Respiratory:  Respirations regular and unlabored, clear to auscultation bilaterally. GI: Soft, nontender, nondistended, BS + x 4. MS: no deformity or atrophy. Skin: warm and dry, no rash. Neuro:  Strength and sensation are intact. Psych: Normal affect.  Accessory Clinical Findings    ECG personally reviewed by me today- Sinus bradycardia, 55 bpm.  Lab Results  Component Value Date   WBC 5.6 01/31/2022   HGB 12.6 01/31/2022   HCT 37.1 01/31/2022    MCV 89.7 01/31/2022   PLT 156.0 01/31/2022   Lab Results  Component Value Date   CREATININE 1.08 01/31/2022   BUN 19 01/31/2022   NA 139 01/31/2022   K 5.0 01/31/2022   CL 103 01/31/2022   CO2 30 01/31/2022   Lab Results  Component Value Date   ALT 19 01/31/2022   AST 19 01/31/2022   ALKPHOS 60 01/31/2022   BILITOT 0.4 01/31/2022   Lab Results  Component Value Date   CHOL 219 (H) 01/31/2022   HDL 64.00 01/31/2022   LDLCALC 136 (H) 01/31/2022   LDLDIRECT 152.1 06/26/2013   TRIG 92.0 01/31/2022   CHOLHDL 3 01/31/2022    Lab Results  Component Value Date   HGBA1C 5.8 01/31/2022    Assessment & Plan   1. CAD/chest pain: Recent coronary CTA revealed coronary calcium score e of 57, 85th percentile for age, sex, race matched control, FFR CT positive obstructive CAD and mid RCA.  Further evaluation with cardiac catheterization was recommended. Discussed procedure in detail at last visit however, patient stated she needed more time to think about it before agreeing to proceed.  She denies any recent symptoms concerning for angina though she has been limiting her activity overall.  She is now ready to proceed with LHC.  Discussed with Dr. Harl Bowie, DOD, and through shared decision making, will proceed with LHC.  She is scheduled for first case on 04/25/2022 with Dr. Claiborne Billings.  Will notify Dr. Stanford Breed, primary cardiologist, as well. Discussed ED precautions.  Continue aspirin, Crestor.  Will update EKG, labs today. Plan for follow-up 2 weeks post cath.   Shared Decision Making/Informed Consent The risks [stroke (1 in 1000), death (1 in 1000), kidney failure [usually temporary] (1 in 500), bleeding (1 in 200), allergic reaction [possibly serious] (1 in 200)], benefits (diagnostic support and management of coronary artery disease) and alternatives of a cardiac catheterization were discussed in detail with Ms. Crenshaw [name] and she is willing to proceed.  2. Elevated BP reading: BP well  controlled. Continue current antihypertensive regimen.    3. Hyperlipidemia: LDL was 22 in May 2023.  Continue aspirin, Crestor.   4. Type 2 diabetes: A1c was 10 in May 2023.  I do not see that she is on any medication at this time.  Recommend follow-up with PCP.   5. Disposition: Follow-up 2 weeks post-cath.   Lenna Sciara, NP 04/14/2022, 10:33 AM

## 2022-04-14 ENCOUNTER — Encounter: Payer: Self-pay | Admitting: Nurse Practitioner

## 2022-04-14 ENCOUNTER — Ambulatory Visit (INDEPENDENT_AMBULATORY_CARE_PROVIDER_SITE_OTHER): Payer: PPO | Admitting: Nurse Practitioner

## 2022-04-14 VITALS — BP 126/78 | HR 59 | Ht 68.0 in | Wt 249.0 lb

## 2022-04-14 DIAGNOSIS — E785 Hyperlipidemia, unspecified: Secondary | ICD-10-CM | POA: Diagnosis not present

## 2022-04-14 DIAGNOSIS — E119 Type 2 diabetes mellitus without complications: Secondary | ICD-10-CM | POA: Diagnosis not present

## 2022-04-14 DIAGNOSIS — R030 Elevated blood-pressure reading, without diagnosis of hypertension: Secondary | ICD-10-CM | POA: Diagnosis not present

## 2022-04-14 DIAGNOSIS — I208 Other forms of angina pectoris: Secondary | ICD-10-CM | POA: Diagnosis not present

## 2022-04-14 DIAGNOSIS — R072 Precordial pain: Secondary | ICD-10-CM

## 2022-04-14 DIAGNOSIS — I2510 Atherosclerotic heart disease of native coronary artery without angina pectoris: Secondary | ICD-10-CM

## 2022-04-14 MED ORDER — SODIUM CHLORIDE 0.9% FLUSH: 3.0000 mL | Freq: Two times a day (BID) | INTRAVENOUS | Status: DC

## 2022-04-14 NOTE — Patient Instructions (Signed)
Medication Instructions:  Your physician recommends that you continue on your current medications as directed. Please refer to the Current Medication list given to you today.   *If you need a refill on your cardiac medications before your next appointment, please call your pharmacy*   Lab Work: Your physician recommends that you complete labs today  If you have labs (blood work) drawn today and your tests are completely normal, you will receive your results only by: MyChart Message (if you have MyChart) OR A paper copy in the mail If you have any lab test that is abnormal or we need to change your treatment, we will call you to review the results.   Testing/Procedures: Your physician has requested that you have a cardiac catheterization. Cardiac catheterization is used to diagnose and/or treat various heart conditions. Doctors may recommend this procedure for a number of different reasons. The most common reason is to evaluate chest pain. Chest pain can be a symptom of coronary artery disease (CAD), and cardiac catheterization can show whether plaque is narrowing or blocking your heart's arteries. This procedure is also used to evaluate the valves, as well as measure the blood flow and oxygen levels in different parts of your heart. For further information please visit [email or web address]. Please follow instruction sheet, as given.    Follow-Up:  [place] [address] Dept: [phone] Loc: [place]  04/14/2022  You are scheduled for a Cardiac Catheterization on Monday, July 31 with Dr. [name].  1. Please arrive at the Main Entrance A at [facility]: [place], [place] [address] at 5:30 AM (This time is two hours before your procedure to ensure your preparation). Free valet parking service is available.   Special  note: Every effort is made to have your procedure done on time. Please understand that emergencies sometimes delay scheduled procedures.  2. Diet: Do not eat solid foods after midnight.  You may have clear liquids until 5 AM upon the day of the procedure.  3. Labs: You will need to have blood drawn on , July 20 at [place], [place]  Open: [place] (Lunch 12:30 - 1:30)   Phone: [phone]. You do not need to be fasting.  4. Medication instructions in preparation for your procedure:   Contrast Allergy: No   Current Outpatient Medications (Endocrine & Metabolic):    levothyroxine (SYNTHROID) 50 MCG tablet, TAKE 1 TABLET BY MOUTH EVERY DAY  Current Outpatient Medications (Cardiovascular):    metoprolol succinate (TOPROL XL) 25 MG 24 hr tablet, Take 1 tablet (25 mg total) by mouth daily.   rosuvastatin (CRESTOR) 20 MG tablet, Take 1 tablet (20 mg total) by mouth daily.   Current Outpatient Medications (Analgesics):    aspirin 81 MG tablet, Take 81 mg by mouth daily.   Current Outpatient Medications (Other):    omeprazole (PRILOSEC) 20 MG capsule, Take 1 capsule (20 mg total) by mouth daily as needed.   On the morning of your procedure, take Aspirin and any morning medicines NOT listed above.  You may use sips of water.  5. Plan to go home the same day, you will only stay overnight if medically necessary. 6. You MUST have a responsible adult to drive you home. 7. An adult MUST be with you the first 24 hours after you arrive home. 8. Bring a current list of  your medications, and the last time and date medication taken. 9. Bring ID and current insurance cards. 10.Please wear clothes that are easy to get on and off and wear slip-on shoes.  Thank you for allowing [place] to care for you!   -- [place] [facility]   Important Information About Sugar

## 2022-04-15 LAB — CBC
Hematocrit: 39.4 % (ref 34.0–46.6)
Hemoglobin: 12.9 g/dL (ref 11.1–15.9)
MCH: 29.6 pg (ref 26.6–33.0)
MCHC: 32.7 g/dL (ref 31.5–35.7)
MCV: 90 fL (ref 79–97)
Platelets: 159 10*3/uL (ref 150–450)
RBC: 4.36 x10E6/uL (ref 3.77–5.28)
RDW: 12 % (ref 11.7–15.4)
WBC: 6.1 10*3/uL (ref 3.4–10.8)

## 2022-04-15 LAB — BASIC METABOLIC PANEL
BUN/Creatinine Ratio: 18 (ref 12–28)
BUN: 16 mg/dL (ref 8–27)
CO2: 26 mmol/L (ref 20–29)
Calcium: 9.6 mg/dL (ref 8.7–10.3)
Chloride: 101 mmol/L (ref 96–106)
Creatinine, Ser: 0.91 mg/dL (ref 0.57–1.00)
Glucose: 104 mg/dL — ABNORMAL HIGH (ref 70–99)
Potassium: 5 mmol/L (ref 3.5–5.2)
Sodium: 139 mmol/L (ref 134–144)
eGFR: 66 mL/min/{1.73_m2} (ref >59)

## 2022-04-19 ENCOUNTER — Telehealth: Payer: Self-pay

## 2022-04-19 DIAGNOSIS — L03031 Cellulitis of right toe: Secondary | ICD-10-CM | POA: Diagnosis not present

## 2022-04-19 NOTE — Telephone Encounter (Signed)
Spoke with pt. Pt was notified of lab results and recommendations. Pt will follow up as planned.

## 2022-04-21 ENCOUNTER — Telehealth: Payer: Self-pay | Admitting: *Deleted

## 2022-04-21 NOTE — Telephone Encounter (Signed)
Cardiac Catheterization scheduled at [facility] for: Monday April 25, 2022 7:30 AM Arrival time and place: [place] Entrance A at: 5:30 AM   Nothing to eat after midnight prior to procedure, clear liquids until 5 AM day of procedure.  Medication instructions: -Usual morning medications can be taken with sips of water including aspirin 81 mg.  Confirmed patient has responsible adult to drive home post procedure and be with patient first 24 hours after arriving home.  Patient reports no new symptoms concerning for COVID-19 in the past 10 days.  Reviewed procedure instructions with patient.

## 2022-04-25 ENCOUNTER — Other Ambulatory Visit (HOSPITAL_COMMUNITY): Payer: Self-pay

## 2022-04-25 ENCOUNTER — Encounter (HOSPITAL_COMMUNITY): Payer: Self-pay | Admitting: Cardiovascular Disease

## 2022-04-25 ENCOUNTER — Other Ambulatory Visit: Payer: Self-pay

## 2022-04-25 ENCOUNTER — Ambulatory Visit (HOSPITAL_COMMUNITY)
Admission: RE | Admit: 2022-04-25 | Discharge: 2022-04-26 | Disposition: A | Discharge status: Home / Self Care | Payer: PPO | Source: Ambulatory Visit | Attending: Cardiovascular Disease | Admitting: Cardiovascular Disease

## 2022-04-25 ENCOUNTER — Encounter (HOSPITAL_COMMUNITY)
Admission: RE | Disposition: A | Discharge status: Home / Self Care | Payer: Self-pay | Source: Ambulatory Visit | Attending: Cardiovascular Disease

## 2022-04-25 ENCOUNTER — Telehealth (HOSPITAL_COMMUNITY): Payer: Self-pay | Admitting: Pharmacy Technician

## 2022-04-25 DIAGNOSIS — R030 Elevated blood-pressure reading, without diagnosis of hypertension: Secondary | ICD-10-CM | POA: Diagnosis not present

## 2022-04-25 DIAGNOSIS — R079 Chest pain, unspecified: Secondary | ICD-10-CM | POA: Diagnosis not present

## 2022-04-25 DIAGNOSIS — R7303 Prediabetes: Secondary | ICD-10-CM | POA: Diagnosis present

## 2022-04-25 DIAGNOSIS — E785 Hyperlipidemia, unspecified: Secondary | ICD-10-CM | POA: Diagnosis not present

## 2022-04-25 DIAGNOSIS — Z79899 Other long term (current) drug therapy: Secondary | ICD-10-CM | POA: Insufficient documentation

## 2022-04-25 DIAGNOSIS — I2510 Atherosclerotic heart disease of native coronary artery without angina pectoris: Secondary | ICD-10-CM | POA: Diagnosis not present

## 2022-04-25 DIAGNOSIS — Z955 Presence of coronary angioplasty implant and graft: Secondary | ICD-10-CM | POA: Diagnosis not present

## 2022-04-25 DIAGNOSIS — N1831 Chronic kidney disease, stage 3a: Secondary | ICD-10-CM | POA: Diagnosis present

## 2022-04-25 DIAGNOSIS — I700 Atherosclerosis of aorta: Secondary | ICD-10-CM | POA: Diagnosis not present

## 2022-04-25 DIAGNOSIS — E119 Type 2 diabetes mellitus without complications: Secondary | ICD-10-CM | POA: Diagnosis not present

## 2022-04-25 DIAGNOSIS — I208 Other forms of angina pectoris: Secondary | ICD-10-CM

## 2022-04-25 DIAGNOSIS — E039 Hypothyroidism, unspecified: Secondary | ICD-10-CM | POA: Diagnosis not present

## 2022-04-25 DIAGNOSIS — E1169 Type 2 diabetes mellitus with other specified complication: Secondary | ICD-10-CM | POA: Diagnosis present

## 2022-04-25 HISTORY — PX: CORONARY STENT INTERVENTION: CATH118234

## 2022-04-25 HISTORY — PX: LEFT HEART CATH AND CORONARY ANGIOGRAPHY: CATH118249

## 2022-04-25 LAB — POCT ACTIVATED CLOTTING TIME: Activated Clotting Time: 323 seconds

## 2022-04-25 SURGERY — LEFT HEART CATH AND CORONARY ANGIOGRAPHY
Anesthesia: LOCAL

## 2022-04-25 MED ORDER — HEPARIN (PORCINE) IN NACL 1000-0.9 UT/500ML-% IV SOLN
INTRAVENOUS | Status: AC
  Filled 2022-04-25: qty 1000

## 2022-04-25 MED ORDER — SODIUM CHLORIDE 0.9 % IV SOLN: INTRAVENOUS | Status: DC

## 2022-04-25 MED ORDER — SODIUM CHLORIDE 0.9 % WEIGHT BASED INFUSION
3.0000 mL/kg/h | INTRAVENOUS | Status: DC
  Administered 2022-04-25: 3 mL/kg/h via INTRAVENOUS

## 2022-04-25 MED ORDER — SODIUM CHLORIDE 0.9 % IV SOLN: 250.0000 mL | INTRAVENOUS | Status: DC | PRN

## 2022-04-25 MED ORDER — NITROGLYCERIN 1 MG/10 ML FOR IR/CATH LAB
INTRA_ARTERIAL | Status: DC | PRN
  Administered 2022-04-25 (×3): 200 ug via INTRACORONARY

## 2022-04-25 MED ORDER — IOHEXOL 350 MG/ML SOLN
INTRAVENOUS | Status: DC | PRN
  Administered 2022-04-25: 160 mL

## 2022-04-25 MED ORDER — HYDRALAZINE HCL 20 MG/ML IJ SOLN
INTRAMUSCULAR | Status: AC
  Filled 2022-04-25: qty 1

## 2022-04-25 MED ORDER — HEPARIN SODIUM (PORCINE) 1000 UNIT/ML IJ SOLN
INTRAMUSCULAR | Status: AC
  Filled 2022-04-25: qty 10

## 2022-04-25 MED ORDER — SODIUM CHLORIDE 0.9% FLUSH
3.0000 mL | Freq: Two times a day (BID) | INTRAVENOUS | Status: DC
  Administered 2022-04-26: 3 mL via INTRAVENOUS

## 2022-04-25 MED ORDER — FENTANYL CITRATE (PF) 100 MCG/2ML IJ SOLN
INTRAMUSCULAR | Status: DC | PRN
  Administered 2022-04-25 (×4): 25 ug via INTRAVENOUS

## 2022-04-25 MED ORDER — SODIUM CHLORIDE 0.9% FLUSH: 3.0000 mL | INTRAVENOUS | Status: DC | PRN

## 2022-04-25 MED ORDER — ONDANSETRON HCL 4 MG/2ML IJ SOLN: 4.0000 mg | Freq: Four times a day (QID) | INTRAMUSCULAR | Status: DC | PRN

## 2022-04-25 MED ORDER — LIDOCAINE HCL (PF) 1 % IJ SOLN
INTRAMUSCULAR | Status: AC
  Filled 2022-04-25: qty 30

## 2022-04-25 MED ORDER — LIDOCAINE HCL (PF) 1 % IJ SOLN
INTRAMUSCULAR | Status: DC | PRN
  Administered 2022-04-25: 2 mL

## 2022-04-25 MED ORDER — TICAGRELOR 90 MG PO TABS
90.0000 mg | ORAL_TABLET | Freq: Two times a day (BID) | ORAL | Status: DC
  Administered 2022-04-25 – 2022-04-26 (×2): 90 mg via ORAL
  Filled 2022-04-25 (×2): qty 1

## 2022-04-25 MED ORDER — HEPARIN (PORCINE) IN NACL 1000-0.9 UT/500ML-% IV SOLN
INTRAVENOUS | Status: DC | PRN
  Administered 2022-04-25 (×2): 500 mL

## 2022-04-25 MED ORDER — ACETAMINOPHEN 325 MG PO TABS
ORAL_TABLET | ORAL | Status: AC
  Filled 2022-04-25: qty 2

## 2022-04-25 MED ORDER — ROSUVASTATIN CALCIUM 20 MG PO TABS
40.0000 mg | ORAL_TABLET | Freq: Every day | ORAL | Status: DC
  Administered 2022-04-25: 40 mg via ORAL
  Filled 2022-04-25: qty 2

## 2022-04-25 MED ORDER — ACETAMINOPHEN 325 MG PO TABS
650.0000 mg | ORAL_TABLET | ORAL | Status: DC | PRN
  Administered 2022-04-25 (×2): 650 mg via ORAL
  Filled 2022-04-25: qty 2

## 2022-04-25 MED ORDER — DIAZEPAM 5 MG PO TABS
5.0000 mg | ORAL_TABLET | Freq: Four times a day (QID) | ORAL | Status: DC | PRN
  Filled 2022-04-25: qty 1

## 2022-04-25 MED ORDER — HYDRALAZINE HCL 20 MG/ML IJ SOLN
INTRAMUSCULAR | Status: DC | PRN
  Administered 2022-04-25: 10 mg via INTRAVENOUS

## 2022-04-25 MED ORDER — NITROGLYCERIN 1 MG/10 ML FOR IR/CATH LAB
INTRA_ARTERIAL | Status: AC
  Filled 2022-04-25: qty 10

## 2022-04-25 MED ORDER — ASPIRIN 81 MG PO CHEW: 81.0000 mg | CHEWABLE_TABLET | ORAL | Status: DC

## 2022-04-25 MED ORDER — SODIUM CHLORIDE 0.9 % WEIGHT BASED INFUSION: 1.0000 mL/kg/h | INTRAVENOUS | Status: DC

## 2022-04-25 MED ORDER — TICAGRELOR 90 MG PO TABS
ORAL_TABLET | ORAL | Status: DC | PRN
  Administered 2022-04-25: 180 mg via ORAL

## 2022-04-25 MED ORDER — FENTANYL CITRATE (PF) 100 MCG/2ML IJ SOLN
INTRAMUSCULAR | Status: AC
  Filled 2022-04-25: qty 2

## 2022-04-25 MED ORDER — LABETALOL HCL 5 MG/ML IV SOLN: 10.0000 mg | INTRAVENOUS | Status: AC | PRN

## 2022-04-25 MED ORDER — MIDAZOLAM HCL 2 MG/2ML IJ SOLN
INTRAMUSCULAR | Status: DC | PRN
  Administered 2022-04-25 (×2): 1 mg via INTRAVENOUS
  Administered 2022-04-25: 2 mg via INTRAVENOUS
  Administered 2022-04-25: 1 mg via INTRAVENOUS

## 2022-04-25 MED ORDER — VERAPAMIL HCL 2.5 MG/ML IV SOLN
INTRAVENOUS | Status: AC
  Filled 2022-04-25: qty 2

## 2022-04-25 MED ORDER — VERAPAMIL HCL 2.5 MG/ML IV SOLN
INTRAVENOUS | Status: DC | PRN
  Administered 2022-04-25: 10 mL via INTRA_ARTERIAL

## 2022-04-25 MED ORDER — MIDAZOLAM HCL 2 MG/2ML IJ SOLN
INTRAMUSCULAR | Status: AC
  Filled 2022-04-25: qty 2

## 2022-04-25 MED ORDER — ASPIRIN 81 MG PO CHEW
81.0000 mg | CHEWABLE_TABLET | Freq: Every day | ORAL | Status: DC
  Administered 2022-04-26: 81 mg via ORAL
  Filled 2022-04-25: qty 1

## 2022-04-25 MED ORDER — TICAGRELOR 90 MG PO TABS
ORAL_TABLET | ORAL | Status: AC
  Filled 2022-04-25: qty 2

## 2022-04-25 MED ORDER — HYDRALAZINE HCL 20 MG/ML IJ SOLN: 10.0000 mg | INTRAMUSCULAR | Status: AC | PRN

## 2022-04-25 MED ORDER — HEPARIN SODIUM (PORCINE) 1000 UNIT/ML IJ SOLN
INTRAMUSCULAR | Status: DC | PRN
  Administered 2022-04-25 (×2): 5500 [IU] via INTRAVENOUS

## 2022-04-25 SURGICAL SUPPLY — 21 items
BALL SAPPHIRE NC24 3.75X10 (BALLOONS) ×2
BALLN SAPPHIRE 2.5X12 (BALLOONS) ×2
BALLOON SAPPHIRE 2.5X12 (BALLOONS) IMPLANT
BALLOON SAPPHIRE NC24 3.75X10 (BALLOONS) IMPLANT
BAND ZEPHYR COMPRESS 30 LONG (HEMOSTASIS) ×1 IMPLANT
CATH LAUNCHER 6FR JR4 (CATHETERS) ×1 IMPLANT
CATH OPTITORQUE TIG 4.0 5F (CATHETERS) ×1 IMPLANT
DEVICE RAD COMP TR BAND LRG (VASCULAR PRODUCTS) ×1 IMPLANT
ELECT DEFIB PAD ADLT CADENCE (PAD) ×1 IMPLANT
GLIDESHEATH SLEND SS 6F .021 (SHEATH) ×1 IMPLANT
GUIDEWIRE INQWIRE 1.5J.035X260 (WIRE) IMPLANT
INQWIRE 1.5J .035X260CM (WIRE) ×2
KIT ENCORE 26 ADVANTAGE (KITS) ×1 IMPLANT
KIT HEART LEFT (KITS) ×2 IMPLANT
PACK CARDIAC CATHETERIZATION (CUSTOM PROCEDURE TRAY) ×2 IMPLANT
SHEATH PROBE COVER 6X72 (BAG) ×1 IMPLANT
STENT SYNERGY XD 3.50X16 (Permanent Stent) IMPLANT
SYNERGY XD 3.50X16 (Permanent Stent) ×2 IMPLANT
TRANSDUCER W/STOPCOCK (MISCELLANEOUS) ×2 IMPLANT
TUBING CIL FLEX 10 FLL-RA (TUBING) ×2 IMPLANT
WIRE COUGAR XT STRL 190CM (WIRE) ×1 IMPLANT

## 2022-04-25 NOTE — Progress Notes (Signed)
IV starts removed. Documented on wrong patient

## 2022-04-25 NOTE — Interval H&P Note (Signed)
Cath Lab Visit (complete for each Cath Lab visit)  Clinical Evaluation Leading to the Procedure:   ACS: No.  Non-ACS:    Anginal Classification: CCS I  Anti-ischemic medical therapy: Minimal Therapy (1 class of medications)  Non-Invasive Test Results: Intermediate-risk stress test findings: cardiac mortality 1-3%/year  Prior CABG: No previous CABG      History and Physical Interval Note:  04/25/2022 7:40 AM  [name]  has presented today for surgery, with the diagnosis of cad.  The various methods of treatment have been discussed with the patient and family. After consideration of risks, benefits and other options for treatment, the patient has consented to  Procedure(s): LEFT HEART CATH AND CORONARY ANGIOGRAPHY (N/A) as a surgical intervention.  The patient's history has been reviewed, patient examined, no change in status, stable for surgery.  I have reviewed the patient's chart and labs.  Questions were answered to the patient's satisfaction.     [name]

## 2022-04-25 NOTE — TOC Benefit Eligibility Note (Signed)
Patient Research scientist (life sciences) completed.     The patient is currently admitted and upon discharge could be taking Brilenta.   The current 30 day co-pay is, $45.   The patient is insured through [employer].

## 2022-04-25 NOTE — Telephone Encounter (Signed)
Pharmacy Patient Advocate Encounter  Insurance verification completed.    The patient is insured through [employer]   The patient is currently admitted and ran test claims for the following: Brilinta '90mg'$ .  Copays and coinsurance results were relayed to Inpatient clinical team.

## 2022-04-26 ENCOUNTER — Other Ambulatory Visit (HOSPITAL_COMMUNITY): Payer: Self-pay

## 2022-04-26 DIAGNOSIS — I700 Atherosclerosis of aorta: Secondary | ICD-10-CM | POA: Diagnosis not present

## 2022-04-26 DIAGNOSIS — R079 Chest pain, unspecified: Secondary | ICD-10-CM | POA: Diagnosis not present

## 2022-04-26 DIAGNOSIS — Z955 Presence of coronary angioplasty implant and graft: Secondary | ICD-10-CM | POA: Diagnosis not present

## 2022-04-26 DIAGNOSIS — Z79899 Other long term (current) drug therapy: Secondary | ICD-10-CM | POA: Diagnosis not present

## 2022-04-26 DIAGNOSIS — E785 Hyperlipidemia, unspecified: Secondary | ICD-10-CM | POA: Diagnosis not present

## 2022-04-26 DIAGNOSIS — E119 Type 2 diabetes mellitus without complications: Secondary | ICD-10-CM | POA: Diagnosis not present

## 2022-04-26 DIAGNOSIS — I2510 Atherosclerotic heart disease of native coronary artery without angina pectoris: Secondary | ICD-10-CM | POA: Diagnosis not present

## 2022-04-26 DIAGNOSIS — R030 Elevated blood-pressure reading, without diagnosis of hypertension: Secondary | ICD-10-CM | POA: Diagnosis not present

## 2022-04-26 DIAGNOSIS — E039 Hypothyroidism, unspecified: Secondary | ICD-10-CM | POA: Diagnosis not present

## 2022-04-26 LAB — CBC
HCT: 34.1 % — ABNORMAL LOW (ref 36.0–46.0)
Hemoglobin: 11 g/dL — ABNORMAL LOW (ref 12.0–15.0)
MCH: 29.3 pg (ref 26.0–34.0)
MCHC: 32.3 g/dL (ref 30.0–36.0)
MCV: 90.9 fL (ref 80.0–100.0)
Platelets: 124 10*3/uL — ABNORMAL LOW (ref 150–400)
RBC: 3.75 MIL/uL — ABNORMAL LOW (ref 3.87–5.11)
RDW: 12.7 % (ref 11.5–15.5)
WBC: 5.1 10*3/uL (ref 4.0–10.5)
nRBC: 0 % (ref 0.0–0.2)

## 2022-04-26 LAB — BASIC METABOLIC PANEL
Anion gap: 4 — ABNORMAL LOW (ref 5–15)
BUN: 13 mg/dL (ref 8–23)
CO2: 24 mmol/L (ref 22–32)
Calcium: 8.4 mg/dL — ABNORMAL LOW (ref 8.9–10.3)
Chloride: 111 mmol/L (ref 98–111)
Creatinine, Ser: 0.84 mg/dL (ref 0.44–1.00)
GFR, Estimated: >60 mL/min (ref >60)
Glucose, Bld: 115 mg/dL — ABNORMAL HIGH (ref 70–99)
Potassium: 3.9 mmol/L (ref 3.5–5.1)
Sodium: 139 mmol/L (ref 135–145)

## 2022-04-26 MED ORDER — ROSUVASTATIN CALCIUM 40 MG PO TABS
40.0000 mg | ORAL_TABLET | Freq: Every day | ORAL | 3 refills | Status: DC
  Filled 2022-04-26: qty 90, 90d supply, fill #0

## 2022-04-26 MED ORDER — TICAGRELOR 90 MG PO TABS
90.0000 mg | ORAL_TABLET | Freq: Two times a day (BID) | ORAL | 11 refills | Status: DC
  Filled 2022-04-26: qty 60, 30d supply, fill #0

## 2022-04-26 NOTE — Discharge Summary (Signed)
Discharge Summary    Patient ID: [name] MRN: [id number]; DOB: 12/30/1945  Admit date: 04/25/2022 Discharge date: 04/26/2022  PCP:  Binnie Rail, MD   [facility] Providers Cardiologist:  Kirk Ruths, MD     Discharge Diagnoses    Principal Problem:   CAD (coronary artery disease), native coronary artery Active Problems:   Hyperlipidemia   Hypothyroidism (acquired)   Prediabetes   Aortic atherosclerosis ([place])   Coronary artery disease involving native coronary artery of native heart without angina pectoris    Diagnostic Studies/Procedures    Left Heart Catheterization 04/25/22   1st Mrg lesion is 30% stenosed.   Prox LAD to Mid LAD lesion is 20% stenosed.   Prox RCA to Mid RCA lesion is 90% stenosed.   Ost RCA to Prox RCA lesion is 20% stenosed.   A drug-eluting stent was successfully placed.   Post intervention, there is a 0% residual stenosis.   There is hyperdynamic left ventricular systolic function.   LV end diastolic pressure is moderately elevated.   The left ventricular ejection fraction is greater than 65% by visual estimate.   Multivessel coronary calcification including the left main, LAD, left circumflex and RCA with mild nonobstructive CAD with the exception of a 90% mid RCA stenosis in a dominant RCA.   Hyperdynamic LV function with EF estimate 65 to 70% without focal segmental wall motion abnormality.  LVEDP is increased at 30 mm.   Successful PCI to the mid RCA with ultimate insertion of a 3.5 x 16 mm Synergy DES stent postdilated to 3.75 mm with 90% stenosis being reduced to 0% and brisk TIMI-3 flow.   RECOMMENDATION: DAPT for minimum of 6 months but preferably at least a year.  Medical therapy for mild nonobstructive concomitant CAD.  Aggressive lipid-lowering therapy and optimal blood pressure control.  Since the patient lives by herself, she will stay overnight with plans for discharge tomorrow.  Diagnostic Dominance:  Right  Intervention   _____________   History of Present Illness     [name] is a 76 y.o. female with a past medical history of CAD, chest pain, HLD, Type 2 DM, GERD, clinical hypothyroidism, anxiety, depression, and venous insufficiency.   Per chart review, patient was previously evaluated by cardiology in 2010.  Nuclear study at the time showed EF 76%, no scar or ischemia.  She was referred by her PCP to Dr. Stanford Breed in May 2023 for evaluation of chest pain concerning for angina.  She reported a 1 year history of chest tightness with vigorous activity, associated back tightness.  Her symptoms were concerning for angina, coronary CTA was performed which revealed coronary calcium score of 97, 85th percentile for age, sex, race matched control, FFR CT positive obstructive CAD and mid RCA, referral for cardiac catheterization was recommended.  She was seen in the office on 03/11/2022 and was stable from a cardiac standpoint. She denied symptoms concerning for angina.  After discussing cardiac catheterization procedure in detail including potential risks associated with the procedure, patient declined cardiac catheterization as well as antianginal therapy stating she needed more time to think about it.    She again was seen on 7/20 for follow-up accompanied by her friend. Since her last visit, she had been stable from a cardiac standpoint.  Denied any symptoms concerning for angina, though she had not been particularly active over the past month.  She had time to think about the recommendation for cardiac catheterization and was willing  to proceed.  She was anxious about the procedure but otherwise, she had been feeling well and denied any new concerns today.  Hospital Course     Consultants: None  CAD  Chest Pain  - Patient was referred to cardiology for the evaluation of chest tightness with activity, underwent coronary CT that showed a calcium score of 420 (85th percentile),  FFR CT was positive for obstructive disease in the mid RCA  - Patient delayed cardiac catheterization initially, but after some thought agreed to proceed - Presented for outpatient LHC on 7/31. Cath showed mild nonobstructive diease in the left main, LAD, and left circumflex. Also showed 90% stenosis of the mid RCA, had successful PCI to the mid RCA with DES  - Recommendations post cath- DAPT for 6 months, preferably 1 year with ASA, Brilinta  - Increase crestor to 40 mg daily  - Continue metoprolol 25 mg daily  - Patient has a follow up appointment on 8/22  HLD  - LDL (calc) 136, HDL 64, triglycerides 92 in 01/2022  - Increase Crestor to 40 mg daily-- needs LFTs and lipid panel in 6-8 weeks   Hypothyroidism Prediabetes  - Continue home regiments, follow with PCP   Did the patient have an acute coronary syndrome (MI, NSTEMI, STEMI, etc) this admission?:  No                               Did the patient have a percutaneous coronary intervention (stent / angioplasty)?:  Yes.     Cath/PCI Registry Performance & Quality Measures: Aspirin prescribed? - Yes ADP Receptor Inhibitor (Plavix/Clopidogrel, Brilinta/Ticagrelor or Effient/Prasugrel) prescribed (includes medically managed patients)? - Yes High Intensity Statin (Lipitor 40-'80mg'$  or Crestor 20-'40mg'$ ) prescribed? - Yes For EF <40%, was ACEI/ARB prescribed? - Not Applicable (EF >/= 81%) For EF <40%, Aldosterone Antagonist (Spironolactone or Eplerenone) prescribed? - Not Applicable (EF >/= 19%) Cardiac Rehab Phase II ordered? - Yes      Patient was seen and examined by Dr. Harl Bowie and was deemed stable for discharge  Patient has a follow up appointment on 8/22 with general cardiology    _____________  Discharge Vitals Blood pressure (!) 145/61, pulse 62, temperature 98 F (36.7 C), temperature source Oral, resp. rate 16, height '5\' 8"'$  (1.727 m), weight 114.8 kg, SpO2 97 %.  Filed Weights   04/25/22 0542 04/26/22 0334  Weight: 111.1 kg  114.8 kg    Labs & Radiologic Studies    CBC Recent Labs    04/26/22 0437  WBC 5.1  HGB 11.0*  HCT 34.1*  MCV 90.9  PLT 147*   Basic Metabolic Panel Recent Labs    04/26/22 0437  NA 139  K 3.9  CL 111  CO2 24  GLUCOSE 115*  BUN 13  CREATININE 0.84  CALCIUM 8.4*   Liver Function Tests No results for input(s): "AST", "ALT", "ALKPHOS", "BILITOT", "PROT", "ALBUMIN" in the last 72 hours. No results for input(s): "LIPASE", "AMYLASE" in the last 72 hours. High Sensitivity Troponin:   No results for input(s): "TROPONINIHS" in the last 720 hours.  BNP Invalid input(s): "POCBNP" D-Dimer No results for input(s): "DDIMER" in the last 72 hours. Hemoglobin A1C No results for input(s): "HGBA1C" in the last 72 hours. Fasting Lipid Panel No results for input(s): "CHOL", "HDL", "LDLCALC", "TRIG", "CHOLHDL", "LDLDIRECT" in the last 72 hours. Thyroid Function Tests No results for input(s): "TSH", "T4TOTAL", "T3FREE", "THYROIDAB" in the last 72 hours.  Invalid input(s): "FREET3" _____________  CARDIAC CATHETERIZATION  Result Date: 04/25/2022   1st Mrg lesion is 30% stenosed.   Prox LAD to Mid LAD lesion is 20% stenosed.   Prox RCA to Mid RCA lesion is 90% stenosed.   Ost RCA to Prox RCA lesion is 20% stenosed.   A drug-eluting stent was successfully placed.   Post intervention, there is a 0% residual stenosis.   There is hyperdynamic left ventricular systolic function.   LV end diastolic pressure is moderately elevated.   The left ventricular ejection fraction is greater than 65% by visual estimate. Multivessel coronary calcification including the left main, LAD, left circumflex and RCA with mild nonobstructive CAD with the exception of a 90% mid RCA stenosis in a dominant RCA. Hyperdynamic LV function with EF estimate 65 to 70% without focal segmental wall motion abnormality.  LVEDP is increased at 30 mm. Successful PCI to the mid RCA with ultimate insertion of a 3.5 x 16 mm Synergy DES  stent postdilated to 3.75 mm with 90% stenosis being reduced to 0% and brisk TIMI-3 flow. RECOMMENDATION: DAPT for minimum of 6 months but preferably at least a year.  Medical therapy for mild nonobstructive concomitant CAD.  Aggressive lipid-lowering therapy and optimal blood pressure control.  Since the patient lives by herself, she will stay overnight with plans for discharge tomorrow.    Disposition   Pt is being discharged home today in good condition.  Follow-up Plans & Appointments     Follow-up Information     Lenna Sciara, NP Follow up on 05/17/2022.   Specialties: Nurse Practitioner, Family Medicine Why: Appoitnment at 11:20 AM Contact information: [address] [place] [address] [phone]                Discharge Instructions     Amb Referral to Cardiac Rehabilitation   Complete by: As directed    Diagnosis: Coronary Stents   After initial evaluation and assessments completed: Virtual Based Care may be provided alone or in conjunction with Phase 2 Cardiac Rehab based on patient barriers.: Yes       Discharge Medications   Allergies as of 04/26/2022       Reactions   Atorvastatin Other (See Comments)   REACTION: Upset stomach   Augmentin [amoxicillin-pot Clavulanate] Diarrhea        Medication List     TAKE these medications    aspirin 81 MG tablet Take 81 mg by mouth daily.   levothyroxine 50 MCG tablet Commonly known as: SYNTHROID TAKE 1 TABLET BY MOUTH EVERY DAY   metoprolol succinate 25 MG 24 hr tablet Commonly known as: Toprol XL Take 1 tablet (25 mg total) by mouth daily.   omeprazole 20 MG capsule Commonly known as: PRILOSEC Take 1 capsule (20 mg total) by mouth daily as needed. What changed: when to take this   rosuvastatin 40 MG tablet Commonly known as: CRESTOR Take 1 tablet (40 mg total) by mouth daily. What changed:  medication strength how much to take   ticagrelor 90 MG Tabs tablet Commonly known  as: BRILINTA Take 1 tablet (90 mg total) by mouth 2 (two) times daily.           Outstanding Labs/Studies   LFTs and lipid panel in 6-8 weeks  Check CBC at follow up   Duration of Discharge Encounter   Greater than 30 minutes including physician time.  Signed, Margie Billet, PA-C 04/26/2022, 12:54 PM

## 2022-04-26 NOTE — Progress Notes (Signed)
Pt safely discharged. Discharge packet provided with teach-back method. VS wnL and as per flow. IVs removed, Pt verbalized understanding. All questions and concerns addressed. TOC meds delivered. Family at pick-up near [place] parking. Volunteers called. Relinquishing care.

## 2022-04-26 NOTE — Progress Notes (Signed)
CARDIAC REHAB PHASE I   Stent education completed with pt. Pt educated on importance of ASA and Brilinta. Pt given heart healthy and diabetic diets. Reviewed site care, restrictions, and exercise guidelines. Provided support and encouragement. Pt anxious to d/c. Will refer to [facility] [place].  [id number] Rufina Falco, RN BSN 04/26/2022 10:38 AM

## 2022-04-27 LAB — LIPOPROTEIN A (LPA): Lipoprotein (a): 280.8 nmol/L — ABNORMAL HIGH (ref <75.0)

## 2022-04-29 ENCOUNTER — Telehealth: Payer: Self-pay | Admitting: Cardiology

## 2022-04-29 NOTE — Telephone Encounter (Signed)
Contacted patient who states her BP readings have been elevated for her. She states the highest she seen was the other day her systolic was 616. Did not give diastolic number. She was recently sent home from heart cath on Tuesday, and has noticed the BP can be high which scares her. She continues to check it multiple times a day, back to back- we did discuss not to do this. Patient states she is anxious after having the procedure done and feels this is the cause of the increase BP readings. She also believes her BP cuff is not working correctly and is going today to purchase another one. We did discuss only taking the BP readings once in the AM, and once in the evening (post medications, as she takes her meds at night). She states her most recent BP reading was 127/60.  We discussed how to best take a BP readings. Patient verbalized understanding. She will continue to take it twice daily (this helps her to not check it multiple times daily) and will take a BP log and bring with her to the appointment on 08/22 with Raquel Sarna, NP. Patient aware I will make Dr.Crenshaw aware- she is aware to call back if BP is consistently elevated and/or symptomatic.   Patient has no symptoms at this time (other than being anxious)

## 2022-04-29 NOTE — Telephone Encounter (Signed)
Pt c/o BP issue: STAT if pt c/o blurred vision, one-sided weakness or slurred speech  1. What are your last 5 BP readings? 147, 153, 127  2. Are you having any other symptoms (ex. Dizziness, headache, blurred vision, passed out)? no  3. What is your BP issue? Patient states she had a stent put in Monday and her BP was high. She says when she had just gotten out of surgery her BP was 147 over something. She says she usually ranges 120's/60-70's. She says they also put her on brilinta and doubled her crestor. She says she also does not have a good blood pressure cuff, but will buy a new one. She says last night her cousin had a hospital BP cuff and her BP was 153, and then 127. She would like to know what her BP range should be.

## 2022-05-03 ENCOUNTER — Encounter: Payer: Self-pay | Admitting: Physician Assistant

## 2022-05-03 ENCOUNTER — Ambulatory Visit (INDEPENDENT_AMBULATORY_CARE_PROVIDER_SITE_OTHER): Payer: PPO | Admitting: Physician Assistant

## 2022-05-03 VITALS — BP 140/80 | HR 63 | Ht 68.0 in | Wt 249.0 lb

## 2022-05-03 DIAGNOSIS — I2510 Atherosclerotic heart disease of native coronary artery without angina pectoris: Secondary | ICD-10-CM | POA: Diagnosis not present

## 2022-05-03 DIAGNOSIS — E119 Type 2 diabetes mellitus without complications: Secondary | ICD-10-CM

## 2022-05-03 DIAGNOSIS — E785 Hyperlipidemia, unspecified: Secondary | ICD-10-CM | POA: Diagnosis not present

## 2022-05-03 MED ORDER — ROSUVASTATIN CALCIUM 40 MG PO TABS: 40.0000 mg | ORAL_TABLET | Freq: Every day | ORAL | 3 refills | Status: DC

## 2022-05-03 MED ORDER — TICAGRELOR 90 MG PO TABS: 90.0000 mg | ORAL_TABLET | Freq: Two times a day (BID) | ORAL | 11 refills | Status: DC

## 2022-05-03 NOTE — Patient Instructions (Signed)
Medication Instructions:  Your physician recommends that you continue on your current medications as directed. Please refer to the Current Medication list given to you today.  *If you need a refill on your cardiac medications before your next appointment, please call your pharmacy*  Lab Work: Your physician recommends that you return for lab work in 4-6 weeks:  Fasting Lipid Panel-DO NOT EAT or DRINK past midnight. Okay to have water. Hepatic (Liver) Function Test   If you have labs (blood work) drawn today and your tests are completely normal, you will receive your results only by: MyChart Message (if you have MyChart) OR A paper copy in the mail If you have any lab test that is abnormal or we need to change your treatment, we will call you to review the results.  Testing/Procedures: NONE ordered at this time of appointment   Follow-Up: At [facility], you and your health needs are our priority.  As part of our continuing mission to provide you with exceptional heart care, we have created designated Provider Care Teams.  These Care Teams include your primary Cardiologist (physician) and Advanced Practice Providers (APPs -  Physician Assistants and Nurse Practitioners) who all work together to provide you with the care you need, when you need it.   Your next appointment:   4 week(s)  The format for your next appointment:   In Person  Provider:   Almyra Deforest, PA-C or APP    Then, Kirk Ruths, MD will plan to see you again in 4 month(s).    Other Instructions   Important Information About Sugar

## 2022-05-03 NOTE — Progress Notes (Unsigned)
Cardiology Office Note:    Date:  05/03/2022   ID:  [name], DOB 03-19-1946, MRN [id number]  PCP:  Chelsey Rail, MD   [facility] Providers Cardiologist:  Chelsey Ruths, MD { Click to update primary MD,subspecialty MD or APP then REFRESH:1}    Referring MD: Chelsey Rail, MD   No chief complaint on file. ***  History of Present Illness:    [name] is a 76 y.o. female with a hx of CAD, HLD, DM II, GERD, clinical hypothyroidism, anxiety/depression and venous insufficiency.  Myoview in 2010 showed EF 76%, no scar or ischemia.  She was seen by [name] in May 2023 for chest pain.  Coronary CT revealed a coronary calcium score of 420, which placed the patient in the 85th percentile for age and sex matched control, FFR was positive for obstructive disease in mid RCA.  The case was discussed with the patient in June, however she declined cardiac catheterization at the time.  She was seen by [name] at which time she changed her mind and willing to proceed with cardiac catheterization.  She ultimately underwent cardiac catheterization on 04/25/2022 which revealed a 30% OM1 lesion, 20% proximal to mid LAD lesion, 90% proximal to mid RCA lesion treated with Synergy DES, 20% ostial to proximal RCA lesion, EF 65%.  Postprocedure, patient was discharged on aspirin and Brilinta.  Crestor was increased to 40 mg daily.  It was recommended for the patient to have fasting lipid panel and LFT in 6 to 8 weeks.  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 ( Dr [name])   Subclinical hypothyroidism 05/17/2011   Venous insufficiency of  right leg 06/23/2017   Follows with vascular   Weakness     Past Surgical History:  Procedure Laterality Date   CHOLECYSTECTOMY N/A 06/13/2017   Procedure: LAPAROSCOPIC CHOLECYSTECTOMY;  Surgeon: Chelsey Epley, MD;  Location: [facility] ORS;  Service: General;  Laterality: N/A;   COLONOSCOPY     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/25/2022   Procedure: CORONARY STENT INTERVENTION;  Surgeon: Chelsey Sine, MD;  Location: [facility];  Service: Cardiovascular;  Laterality: N/A;   LEFT HEART CATH AND CORONARY ANGIOGRAPHY N/A 04/25/2022   Procedure: LEFT HEART CATH AND CORONARY ANGIOGRAPHY;  Surgeon: Chelsey Sine, MD;  Location: [facility];  Service: Cardiovascular;  Laterality: N/A;   MASS EXCISION     right upper inner thigh or cyst   WISDOM TOOTH EXTRACTION      Current Medications: Current Meds  Medication Sig   aspirin 81 MG tablet Take 81 mg by mouth daily.   levothyroxine (SYNTHROID) 50 MCG tablet TAKE 1 TABLET BY MOUTH EVERY DAY   metoprolol succinate (TOPROL XL) 25 MG 24 hr tablet Take 1 tablet (25 mg total) by mouth daily.   omeprazole (PRILOSEC) 20 MG capsule Take 1 capsule (20 mg total) by mouth daily as needed. (Patient taking differently: Take 20 mg by mouth daily.)   rosuvastatin (CRESTOR) 40 MG tablet Take 1 tablet (40 mg total) by mouth daily.  ticagrelor (BRILINTA) 90 MG TABS tablet Take 1 tablet (90 mg total) by mouth 2 (two) times daily.     Allergies:   Atorvastatin and Augmentin [amoxicillin-pot clavulanate]   Social History   Socioeconomic History   Marital status: Widowed    Spouse name: Not on file   Number of children: 0   Years of education: 15   Highest education level: 12th grade  Occupational History   Not on file  Tobacco Use   Smoking status: Never   Smokeless tobacco: Never  Vaping Use   Vaping Use: Never used  Substance and Sexual Activity   Alcohol use: No   Drug use: No   Sexual activity: Not on file  Other Topics Concern    Not on file  Social History Narrative   Retired Merchandiser, retail for [employer]   Widowed (husband - [name]), no children   Never Smoked    Alcohol use-no      Daily Caffeine Use 2/day   Social Determinants of Health   Financial Resource Strain: Low Risk  (04/12/2022)   Overall Financial Resource Strain (CARDIA)    Difficulty of Paying Living Expenses: Not hard at all  Food Insecurity: No Food Insecurity (04/12/2022)   Hunger Vital Sign    Worried About Running Out of Food in the Last Year: Never true    Ran Out of Food in the Last Year: Never true  Transportation Needs: No Transportation Needs (04/12/2022)   PRAPARE - Hydrologist (Medical): No    Lack of Transportation (Non-Medical): No  Physical Activity: Insufficiently Active (04/12/2022)   Exercise Vital Sign    Days of Exercise per Week: 3 days    Minutes of Exercise per Session: 30 min  Stress: No Stress Concern Present (04/12/2022)   [place]    Feeling of Stress : Not at all  Social Connections: Moderately Integrated (04/12/2022)   Social Connection and Isolation Panel [NHANES]    Frequency of Communication with Friends and Family: More than three times a week    Frequency of Social Gatherings with Friends and Family: More than three times a week    Attends Religious Services: More than 4 times per year    Active Member of [employer] or Organizations: Yes    Attends Archivist Meetings: More than 4 times per year    Marital Status: Widowed     Family History: The patient's ***family history includes Alzheimer's disease in her father; Breast cancer in her maternal grandmother; COPD in her mother; Coronary artery disease in her father. There is no history of Colon cancer, Colon polyps, Esophageal cancer, Rectal cancer, or Stomach cancer.  ROS:   Please see the history of present illness.    *** All other  systems reviewed and are negative.  EKGs/Labs/Other Studies Reviewed:    The following studies were reviewed today: ***  EKG:  EKG is ordered today.  The ekg ordered today demonstrates normal sinus rhythm, no significant ST-T wave changes.  Recent Labs: 01/31/2022: ALT 19; TSH 2.99 04/26/2022: BUN 13; Creatinine, Ser 0.84; Hemoglobin 11.0; Platelets 124; Potassium 3.9; Sodium 139  Recent Lipid Panel    Component Value Date/Time   CHOL 219 (H) 01/31/2022 0901   TRIG 92.0 01/31/2022 0901   HDL 64.00 01/31/2022 0901   CHOLHDL 3 01/31/2022 0901   VLDL 18.4 01/31/2022 0901   LDLCALC 136 (H) 01/31/2022 0901  LDLDIRECT 152.1 06/26/2013 0836     Risk Assessment/Calculations:   {Does this patient have ATRIAL FIBRILLATION?:[phone]}       Physical Exam:    VS:  Pulse 63   Ht '5\' 8"'$  (1.727 m)   Wt 249 lb (112.9 kg)   SpO2 96%   BMI 37.86 kg/m     No BP recorded.  {Refresh Note OR Click here to enter BP  :1}***   Wt Readings from Last 3 Encounters:  05/03/22 249 lb (112.9 kg)  04/26/22 253 lb (114.8 kg)  04/14/22 249 lb (112.9 kg)     GEN: *** Well nourished, well developed in no acute distress HEENT: Normal NECK: No JVD; No carotid bruits LYMPHATICS: No lymphadenopathy CARDIAC: ***RRR, no murmurs, rubs, gallops RESPIRATORY:  Clear to auscultation without rales, wheezing or rhonchi  ABDOMEN: Soft, non-tender, non-distended MUSCULOSKELETAL:  No edema; No deformity  SKIN: Warm and dry NEUROLOGIC:  Alert and oriented x 3 PSYCHIATRIC:  Normal affect   ASSESSMENT:    No diagnosis found. PLAN:    In order of problems listed above:  ***  {The patient has an active order for outpatient cardiac rehabilitation.   Please indicate if the patient is ready to start. Do NOT delete this.  It will auto delete.  Refresh note, then sign.              Click here to document readiness and see contraindications.  :1}  Cardiac Rehabilitation Eligibility Assessment       {Are  you ordering a CV Procedure (e.g. stress test, cath, DCCV, TEE, etc)?   Press F2        :[id number]}    Medication Adjustments/Labs and Tests Ordered: Current medicines are reviewed at length with the patient today.  Concerns regarding medicines are outlined above.  No orders of the defined types were placed in this encounter.  No orders of the defined types were placed in this encounter.   There are no Patient Instructions on file for this visit.   Hilbert Corrigan, [place]  05/03/2022 1:19 PM    [facility]

## 2022-05-05 ENCOUNTER — Encounter: Payer: Self-pay | Admitting: Physician Assistant

## 2022-05-06 ENCOUNTER — Other Ambulatory Visit: Payer: Self-pay

## 2022-05-06 MED ORDER — EZETIMIBE 10 MG PO TABS: 10.0000 mg | ORAL_TABLET | Freq: Every day | ORAL | 3 refills | Status: DC

## 2022-05-09 ENCOUNTER — Telehealth: Payer: Self-pay | Admitting: Cardiology

## 2022-05-09 NOTE — Telephone Encounter (Signed)
Pt c/o medication issue:  1. Name of Medication:  ezetimibe (ZETIA) 10 MG tablet  2. How are you currently taking this medication (dosage and times per day)?   3. Are you having a reaction (difficulty breathing--STAT)?   4. What is your medication issue?   Patient states her pharmacy notified her that this medication was ready for pickup, but it was never discussed prior. She states she was not informed she would be starting any new medications and she would like to discuss this ASAP.

## 2022-05-09 NOTE — Telephone Encounter (Signed)
Returned  call to patient who was upset about Zetia prescription and states that she did not know anything about it. Advised patient of Dr. Evette Georges result note as follows:  Per Dr.Kelly:  LP little a is significantly increased at 280.  Consider adding Zetia 10 mg to rosuvastatin 40 mg with target LDL in the 50s or below (please let us know which pharmacy to send your medication too.)   Thank you!  Written by Caprice Beaver, LPN on 9/62/9528  4:13 AM EDT Seen by patient [name] on 05/05/2022  1:35 PM  Advised patient that once results reviewed in MyChart the medication was sent over to the pharmacy. Patient states that she did see result but didn't know the medication was sent to the pharmacy and wasn't aware of instructions. Went over indications for Zetia as well as instructions. Patient aware and states that she will start it now.   Advised patient to call back to office with any issues, questions, or concerns. Patient verbalized understanding.

## 2022-05-11 DIAGNOSIS — H04223 Epiphora due to insufficient drainage, bilateral lacrimal glands: Secondary | ICD-10-CM | POA: Diagnosis not present

## 2022-05-11 DIAGNOSIS — H2513 Age-related nuclear cataract, bilateral: Secondary | ICD-10-CM | POA: Diagnosis not present

## 2022-05-11 DIAGNOSIS — H16223 Keratoconjunctivitis sicca, not specified as Sjogren's, bilateral: Secondary | ICD-10-CM | POA: Diagnosis not present

## 2022-05-11 DIAGNOSIS — H01003 Unspecified blepharitis right eye, unspecified eyelid: Secondary | ICD-10-CM | POA: Diagnosis not present

## 2022-05-11 DIAGNOSIS — H04221 Epiphora due to insufficient drainage, right lacrimal gland: Secondary | ICD-10-CM | POA: Diagnosis not present

## 2022-05-13 ENCOUNTER — Encounter: Payer: PPO | Attending: Cardiology | Admitting: *Deleted

## 2022-05-13 DIAGNOSIS — Z955 Presence of coronary angioplasty implant and graft: Secondary | ICD-10-CM

## 2022-05-13 DIAGNOSIS — Z48812 Encounter for surgical aftercare following surgery on the circulatory system: Secondary | ICD-10-CM | POA: Insufficient documentation

## 2022-05-13 DIAGNOSIS — I2510 Atherosclerotic heart disease of native coronary artery without angina pectoris: Secondary | ICD-10-CM | POA: Insufficient documentation

## 2022-05-13 NOTE — Progress Notes (Signed)
Initial telephone orientation completed. Diagnosis can be found in [facility] 7/31. EP orientation scheduled for Monday 8/28 at 1:30pm.

## 2022-05-17 ENCOUNTER — Ambulatory Visit: Payer: PPO | Admitting: Nurse Practitioner

## 2022-05-20 DIAGNOSIS — H04551 Acquired stenosis of right nasolacrimal duct: Secondary | ICD-10-CM | POA: Diagnosis not present

## 2022-05-20 DIAGNOSIS — H04203 Unspecified epiphora, bilateral lacrimal glands: Secondary | ICD-10-CM | POA: Diagnosis not present

## 2022-05-23 ENCOUNTER — Encounter: Payer: PPO | Admitting: *Deleted

## 2022-05-23 VITALS — Ht 67.75 in | Wt 245.2 lb

## 2022-05-23 DIAGNOSIS — Z48812 Encounter for surgical aftercare following surgery on the circulatory system: Secondary | ICD-10-CM | POA: Diagnosis not present

## 2022-05-23 DIAGNOSIS — I2510 Atherosclerotic heart disease of native coronary artery without angina pectoris: Secondary | ICD-10-CM | POA: Diagnosis not present

## 2022-05-23 DIAGNOSIS — Z955 Presence of coronary angioplasty implant and graft: Secondary | ICD-10-CM

## 2022-05-23 NOTE — Progress Notes (Signed)
Cardiac Individual Treatment Plan  Patient Details  Name: [name] MRN: [id number] Date of Birth: 17-Apr-1946 Referring Provider:   Flowsheet Row Cardiac Rehab from 05/23/2022 in [facility] Cardiac and Pulmonary Rehab  Referring Provider Dr. Kirk Ruths       Initial Encounter Date:  Flowsheet Row Cardiac Rehab from 05/23/2022 in [facility] Cardiac and Pulmonary Rehab  Date 05/23/22       Visit Diagnosis: Status post coronary artery stent placement  Patient's Home Medications on Admission:  Current Outpatient Medications:    aspirin 81 MG tablet, Take 81 mg by mouth daily., Disp: , Rfl:    ezetimibe (ZETIA) 10 MG tablet, Take 1 tablet (10 mg total) by mouth daily., Disp: 90 tablet, Rfl: 3   levothyroxine (SYNTHROID) 50 MCG tablet, TAKE 1 TABLET BY MOUTH EVERY DAY, Disp: 90 tablet, Rfl: 3   metoprolol succinate (TOPROL XL) 25 MG 24 hr tablet, Take 1 tablet (25 mg total) by mouth daily., Disp: 90 tablet, Rfl: 3   omeprazole (PRILOSEC) 20 MG capsule, Take 1 capsule (20 mg total) by mouth daily as needed. (Patient taking differently: Take 20 mg by mouth daily.), Disp: 90 capsule, Rfl: 3   rosuvastatin (CRESTOR) 40 MG tablet, Take 1 tablet (40 mg total) by mouth daily., Disp: 90 tablet, Rfl: 3   ticagrelor (BRILINTA) 90 MG TABS tablet, Take 1 tablet (90 mg total) by mouth 2 (two) times daily., Disp: 60 tablet, Rfl: 11  Past Medical History: 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 ( Dr Tonia Brooms)   Subclinical hypothyroidism 05/17/2011   Venous insufficiency of right leg 06/23/2017   Follows with vascular   Weakness     Tobacco Use: Social History   Tobacco Use   Smoking Status Never  Smokeless Tobacco Never    Labs: Review Flowsheet  More data exists      Latest Ref Rng & Units 06/26/2019 01/01/2020 10/21/2020 04/16/2021 01/31/2022  Labs for ITP Cardiac and Pulmonary Rehab  Cholestrol 0 - 200 mg/dL 213  - 208  221  219   LDL (calc) 0 - 99 mg/dL 116  - 125  135  136   HDL-C >39.00 mg/dL 67.80  - 64.60  65.00  64.00   Trlycerides 0.0 - 149.0 mg/dL 144.0  - 93.0  102.0  92.0   Hemoglobin A1c 4.6 - 6.5 % 5.6  5.6  5.8  5.8  5.8      Exercise Target Goals: Exercise Program Goal: Individual exercise prescription set using results from initial 6 min walk test and THRR while considering  patient's activity barriers and safety.   Exercise Prescription Goal: Initial exercise prescription builds to 30-45 minutes a day of aerobic activity, 2-3 days per week.  Home exercise guidelines will be given to patient during program as part of exercise prescription that the participant will acknowledge.   Education: Aerobic Exercise: - Group verbal and visual presentation on the components of exercise prescription. Introduces F.I.T.T principle from ACSM for exercise prescriptions.  Reviews F.I.T.T. principles of aerobic exercise including progression. Written material given at graduation.   Education: Resistance Exercise: - Group  verbal and visual presentation on the components of exercise prescription. Introduces F.I.T.T principle from ACSM for exercise prescriptions  Reviews F.I.T.T. principles of resistance exercise including progression. Written material given at graduation.    Education: Exercise & Equipment Safety: - Individual verbal instruction and demonstration of equipment use and safety with use of the equipment. Flowsheet Row Cardiac Rehab from 05/23/2022 in [facility] Cardiac and Pulmonary Rehab  Date 05/23/22  Educator [facility]  Instruction Review Code 1- Verbalizes Understanding       Education: Exercise Physiology & General Exercise Guidelines: - Group  verbal and written instruction with models to review the exercise physiology of the cardiovascular system and associated critical values. Provides general exercise guidelines with specific guidelines to those with heart or lung disease.  Flowsheet Row Cardiac Rehab from 05/23/2022 in [facility] Cardiac and Pulmonary Rehab  Education need identified 05/23/22       Education: Flexibility, Balance, Mind/Body Relaxation: - Group verbal and visual presentation with interactive activity on the components of exercise prescription. Introduces F.I.T.T principle from ACSM for exercise prescriptions. Reviews F.I.T.T. principles of flexibility and balance exercise training including progression. Also discusses the mind body connection.  Reviews various relaxation techniques to help reduce and manage stress (i.e. Deep breathing, progressive muscle relaxation, and visualization). Balance handout provided to take home. Written material given at graduation.   Activity Barriers & Risk Stratification:  Activity Barriers & Cardiac Risk Stratification - 05/13/22 1413       Activity Barriers & Cardiac Risk Stratification   Activity Barriers None    Cardiac Risk Stratification Moderate             6 Minute Walk:  6 Minute Walk     Row Name 05/23/22 1509         6 Minute Walk   Phase Initial     Distance 1340 feet     Walk Time 6 minutes     # of Rest Breaks 0     MPH 2.54     METS 2.31     RPE 10     Perceived Dyspnea  0     VO2 Peak 8.08     Symptoms No     Resting HR 70 bpm     Resting BP 122/80     Resting Oxygen Saturation  98 %     Exercise Oxygen Saturation  during 6 min walk 97 %     Max Ex. HR 98 bpm     Max Ex. BP 138/72     2 Minute Post BP 128/72              Oxygen Initial Assessment:   Oxygen Re-Evaluation:   Oxygen Discharge (Final Oxygen Re-Evaluation):   Initial Exercise Prescription:  Initial Exercise Prescription - 05/23/22 1500       Date of Initial  Exercise RX and Referring Provider   Date 05/23/22    Referring Provider Dr. Kirk Ruths      Oxygen   Maintain Oxygen Saturation 88% or higher      NuStep   Level 2    SPM 80    Minutes 15    METs 2.31      REL-XR   Level 1    Speed 50    Minutes 15    METs 2.31      Track   Laps 35    Minutes 15    METs 2.9      Prescription Details   Frequency (  times per week) 2    Duration Progress to 30 minutes of continuous aerobic without signs/symptoms of physical distress      Intensity   THRR 40-80% of Max Heartrate 100-130    Ratings of Perceived Exertion 11-13    Perceived Dyspnea 0-4      Progression   Progression Continue to progress workloads to maintain intensity without signs/symptoms of physical distress.      Resistance Training   Training Prescription Yes    Weight 3    Reps 10-15             Perform Capillary Blood Glucose checks as needed.  Exercise Prescription Changes:   Exercise Prescription Changes     Row Name 05/23/22 1500             Response to Exercise   Blood Pressure (Admit) 122/80       Blood Pressure (Exercise) 138/72       Blood Pressure (Exit) 128/72       Heart Rate (Admit) 70 bpm       Heart Rate (Exercise) 109 bpm       Heart Rate (Exit) 72 bpm       Oxygen Saturation (Admit) 98 %       Oxygen Saturation (Exercise) 97 %       Oxygen Saturation (Exit) 98 %       Rating of Perceived Exertion (Exercise) 10       Perceived Dyspnea (Exercise) 0       Symptoms none       Comments 6 MWT results                Exercise Comments:   Exercise Goals and Review:   Exercise Goals     Row Name 05/23/22 1513             Exercise Goals   Increase Physical Activity Yes       Intervention Provide advice, education, support and counseling about physical activity/exercise needs.;Develop an individualized exercise prescription for aerobic and resistive training based on initial evaluation findings, risk stratification,  comorbidities and participant's personal goals.       Expected Outcomes Short Term: Attend rehab on a regular basis to increase amount of physical activity.;Long Term: Add in home exercise to make exercise part of routine and to increase amount of physical activity.;Long Term: Exercising regularly at least 3-5 days a week.       Increase Strength and Stamina Yes       Intervention Provide advice, education, support and counseling about physical activity/exercise needs.;Develop an individualized exercise prescription for aerobic and resistive training based on initial evaluation findings, risk stratification, comorbidities and participant's personal goals.       Expected Outcomes Short Term: Increase workloads from initial exercise prescription for resistance, speed, and METs.;Short Term: Perform resistance training exercises routinely during rehab and add in resistance training at home;Long Term: Improve cardiorespiratory fitness, muscular endurance and strength as measured by increased METs and functional capacity (6MWT)       Able to understand and use rate of perceived exertion (RPE) scale Yes       Intervention Provide education and explanation on how to use RPE scale       Expected Outcomes Short Term: Able to use RPE daily in rehab to express subjective intensity level;Long Term:  Able to use RPE to guide intensity level when exercising independently       Able to understand and use  Dyspnea scale Yes       Intervention Provide education and explanation on how to use Dyspnea scale       Expected Outcomes Short Term: Able to use Dyspnea scale daily in rehab to express subjective sense of shortness of breath during exertion;Long Term: Able to use Dyspnea scale to guide intensity level when exercising independently       Knowledge and understanding of Target Heart Rate Range (THRR) Yes       Intervention Provide education and explanation of THRR including how the numbers were predicted and where they  are located for reference       Expected Outcomes Short Term: Able to state/look up THRR;Short Term: Able to use daily as guideline for intensity in rehab;Long Term: Able to use THRR to govern intensity when exercising independently       Able to check pulse independently Yes       Intervention Provide education and demonstration on how to check pulse in carotid and radial arteries.;Review the importance of being able to check your own pulse for safety during independent exercise       Expected Outcomes Short Term: Able to explain why pulse checking is important during independent exercise;Long Term: Able to check pulse independently and accurately       Understanding of Exercise Prescription Yes       Intervention Provide education, explanation, and written materials on patient's individual exercise prescription       Expected Outcomes Short Term: Able to explain program exercise prescription;Long Term: Able to explain home exercise prescription to exercise independently                Exercise Goals Re-Evaluation :   Discharge Exercise Prescription (Final Exercise Prescription Changes):  Exercise Prescription Changes - 05/23/22 1500       Response to Exercise   Blood Pressure (Admit) 122/80    Blood Pressure (Exercise) 138/72    Blood Pressure (Exit) 128/72    Heart Rate (Admit) 70 bpm    Heart Rate (Exercise) 109 bpm    Heart Rate (Exit) 72 bpm    Oxygen Saturation (Admit) 98 %    Oxygen Saturation (Exercise) 97 %    Oxygen Saturation (Exit) 98 %    Rating of Perceived Exertion (Exercise) 10    Perceived Dyspnea (Exercise) 0    Symptoms none    Comments 6 MWT results             Nutrition:  Target Goals: Understanding of nutrition guidelines, daily intake of sodium '1500mg'$ , cholesterol '200mg'$ , calories 30% from fat and 7% or less from saturated fats, daily to have 5 or more servings of fruits and vegetables.  Education: All About Nutrition: -Group instruction  provided by verbal, written material, interactive activities, discussions, models, and posters to present general guidelines for heart healthy nutrition including fat, fiber, MyPlate, the role of sodium in heart healthy nutrition, utilization of the nutrition label, and utilization of this knowledge for meal planning. Follow up email sent as well. Written material given at graduation. Flowsheet Row Cardiac Rehab from 05/23/2022 in [facility] Cardiac and Pulmonary Rehab  Education need identified 05/23/22       Biometrics:  Pre Biometrics - 05/23/22 1514       Pre Biometrics   Height 5' 7.75" (1.721 m)    Weight 245 lb 3.2 oz (111.2 kg)    Waist Circumference 42.25 inches    Hip Circumference 55 inches    Waist to Hip  Ratio 0.77 %    BMI (Calculated) 37.55    Single Leg Stand 4.42 seconds              Nutrition Therapy Plan and Nutrition Goals:  Nutrition Therapy & Goals - 05/23/22 1520       Intervention Plan   Intervention Prescribe, educate and counsel regarding individualized specific dietary modifications aiming towards targeted core components such as weight, hypertension, lipid management, diabetes, heart failure and other comorbidities.    Expected Outcomes Short Term Goal: Understand basic principles of dietary content, such as calories, fat, sodium, cholesterol and nutrients.;Short Term Goal: A plan has been developed with personal nutrition goals set during dietitian appointment.;Long Term Goal: Adherence to prescribed nutrition plan.             Nutrition Assessments:  MEDIFICTS Score Key: ?70 Need to make dietary changes  40-70 Heart Healthy Diet ? 40 Therapeutic Level Cholesterol Diet  Flowsheet Row Cardiac Rehab from 05/23/2022 in [facility] Cardiac and Pulmonary Rehab  Picture Your Plate Total Score on Admission 64      Picture Your Plate Scores: <55 Unhealthy dietary pattern with much room for improvement. 41-50 Dietary pattern unlikely to meet recommendations  for good health and room for improvement. 51-60 More healthful dietary pattern, with some room for improvement.  >60 Healthy dietary pattern, although there may be some specific behaviors that could be improved.    Nutrition Goals Re-Evaluation:   Nutrition Goals Discharge (Final Nutrition Goals Re-Evaluation):   Psychosocial: Target Goals: Acknowledge presence or absence of significant depression and/or stress, maximize coping skills, provide positive support system. Participant is able to verbalize types and ability to use techniques and skills needed for reducing stress and depression.   Education: Stress, Anxiety, and Depression - Group verbal and visual presentation to define topics covered.  Reviews how body is impacted by stress, anxiety, and depression.  Also discusses healthy ways to reduce stress and to treat/manage anxiety and depression.  Written material given at graduation.   Education: Sleep Hygiene -Provides group verbal and written instruction about how sleep can affect your health.  Define sleep hygiene, discuss sleep cycles and impact of sleep habits. Review good sleep hygiene tips.    Initial Review & Psychosocial Screening:  Initial Psych Review & Screening - 05/13/22 1420       Initial Review   Current issues with Current Stress Concerns      Family Dynamics   Good Support System? Yes      Barriers   Psychosocial barriers to participate in program There are no identifiable barriers or psychosocial needs.;The patient should benefit from training in stress management and relaxation.      Screening Interventions   Interventions Encouraged to exercise;Provide feedback about the scores to participant;To provide support and resources with identified psychosocial needs    Expected Outcomes Short Term goal: Utilizing psychosocial counselor, staff and physician to assist with identification of specific Stressors or current issues interfering with healing process.  Setting desired goal for each stressor or current issue identified.;Long Term Goal: Stressors or current issues are controlled or eliminated.;Short Term goal: Identification and review with participant of any Quality of Life or Depression concerns found by scoring the questionnaire.;Long Term goal: The participant improves quality of Life and PHQ9 Scores as seen by post scores and/or verbalization of changes             Quality of Life Scores:   Quality of Life - 05/23/22 1517  Quality of Life   Select Quality of Life      Quality of Life Scores   Health/Function Pre 25.71 %    Socioeconomic Pre 27 %    Psych/Spiritual Pre 26.57 %    Family Pre 24 %    GLOBAL Pre 26.14 %            Scores of 19 and below usually indicate a poorer quality of life in these areas.  A difference of  2-3 points is a clinically meaningful difference.  A difference of 2-3 points in the total score of the Quality of Life Index has been associated with significant improvement in overall quality of life, self-image, physical symptoms, and general health in studies assessing change in quality of life.  PHQ-9: Review Flowsheet  More data exists      05/23/2022 04/12/2022 03/23/2021 04/15/2020 01/28/2020  Depression screen PHQ 2/9  Decreased Interest 1 0 0 0 0  Down, Depressed, Hopeless 1 0 0 0 0  PHQ - 2 Score 2 0 0 0 0  Altered sleeping 0 - - 0 -  Tired, decreased energy 1 - - 0 -  Change in appetite 0 - - 0 -  Feeling bad or failure about yourself  0 - - 0 -  Trouble concentrating 1 - - 0 -  Moving slowly or fidgety/restless 0 - - 0 -  Suicidal thoughts 0 - - 0 -  PHQ-9 Score 4 - - 0 -  Difficult doing work/chores Not difficult at all - - Not difficult at all -   Interpretation of Total Score  Total Score Depression Severity:  1-4 = Minimal depression, 5-9 = Mild depression, 10-14 = Moderate depression, 15-19 = Moderately severe depression, 20-27 = Severe depression   Psychosocial Evaluation  and Intervention:  Psychosocial Evaluation - 05/13/22 1436       Psychosocial Evaluation & Interventions   Interventions Encouraged to exercise with the program and follow exercise prescription;Stress management education;Relaxation education    Comments Sami Roes has only been on thyroid medication until her recent stent which led to being put on blood pressure, cholesteral and blood thinner medication. She really wants to get off as much medication as she can as she doesn't like the side effects. She has a history of increased stress. She states she realized something was off in the spring when she was putting out mulch and kept getting short of breath which prompted her to seek evaluation from her MD. She feels thankful it was just a stent and not bypass like her husband (who has passed now) needed years ago. She is very interested in the education component, especially nutrition.    Expected Outcomes Short: attend cardiac rehab for education and exercise. Long: develop and maintian positive self care habits.    Continue Psychosocial Services  Follow up required by staff             Psychosocial Re-Evaluation:   Psychosocial Discharge (Final Psychosocial Re-Evaluation):   Vocational Rehabilitation: Provide vocational rehab assistance to qualifying candidates.   Vocational Rehab Evaluation & Intervention:  Vocational Rehab - 05/13/22 1420       Initial Vocational Rehab Evaluation & Intervention   Assessment shows need for Vocational Rehabilitation No             Education: Education Goals: Education classes will be provided on a variety of topics geared toward better understanding of heart health and risk factor modification. Participant will state understanding/return demonstration of topics  presented as noted by education test scores.  Learning Barriers/Preferences:  Learning Barriers/Preferences - 05/13/22 1420       Learning Barriers/Preferences   Learning Barriers  None    Learning Preferences Individual Instruction             General Cardiac Education Topics:  AED/CPR: - Group verbal and written instruction with the use of models to demonstrate the basic use of the AED with the basic ABC's of resuscitation.   Anatomy and Cardiac Procedures: - Group verbal and visual presentation and models provide information about basic cardiac anatomy and function. Reviews the testing methods done to diagnose heart disease and the outcomes of the test results. Describes the treatment choices: Medical Management, Angioplasty, or Coronary Bypass Surgery for treating various heart conditions including Myocardial Infarction, Angina, Valve Disease, and Cardiac Arrhythmias.  Written material given at graduation. Flowsheet Row Cardiac Rehab from 05/23/2022 in [facility] Cardiac and Pulmonary Rehab  Education need identified 05/23/22       Medication Safety: - Group verbal and visual instruction to review commonly prescribed medications for heart and lung disease. Reviews the medication, class of the drug, and side effects. Includes the steps to properly store meds and maintain the prescription regimen.  Written material given at graduation.   Intimacy: - Group verbal instruction through game format to discuss how heart and lung disease can affect sexual intimacy. Written material given at graduation.. Flowsheet Row Cardiac Rehab from 05/23/2022 in [facility] Cardiac and Pulmonary Rehab  Education need identified 05/23/22       Know Your Numbers and Heart Failure: - Group verbal and visual instruction to discuss disease risk factors for cardiac and pulmonary disease and treatment options.  Reviews associated critical values for Overweight/Obesity, Hypertension, Cholesterol, and Diabetes.  Discusses basics of heart failure: signs/symptoms and treatments.  Introduces Heart Failure Zone chart for action plan for heart failure.  Written material given at graduation. Flowsheet  Row Cardiac Rehab from 05/23/2022 in [facility] Cardiac and Pulmonary Rehab  Education need identified 05/23/22       Infection Prevention: - Provides verbal and written material to individual with discussion of infection control including proper hand washing and proper equipment cleaning during exercise session. Flowsheet Row Cardiac Rehab from 05/23/2022 in [facility] Cardiac and Pulmonary Rehab  Date 05/23/22  Educator [facility]  Instruction Review Code 1- Verbalizes Understanding       Falls Prevention: - Provides verbal and written material to individual with discussion of falls prevention and safety. Flowsheet Row Cardiac Rehab from 05/23/2022 in [facility] Cardiac and Pulmonary Rehab  Date 05/23/22  Educator [facility]  Instruction Review Code 1- Verbalizes Understanding       Other: -Provides group and verbal instruction on various topics (see comments)   Knowledge Questionnaire Score:  Knowledge Questionnaire Score - 05/23/22 1518       Knowledge Questionnaire Score   Pre Score 19/26             Core Components/Risk Factors/Patient Goals at Admission:  Personal Goals and Risk Factors at Admission - 05/23/22 1519       Core Components/Risk Factors/Patient Goals on Admission    Weight Management Yes    Intervention Weight Management: Develop a combined nutrition and exercise program designed to reach desired caloric intake, while maintaining appropriate intake of nutrient and fiber, sodium and fats, and appropriate energy expenditure required for the weight goal.;Weight Management: Provide education and appropriate resources to help participant work on and attain dietary goals.;Weight Management/Obesity: Establish reasonable  short term and long term weight goals.;Obesity: Provide education and appropriate resources to help participant work on and attain dietary goals.    Admit Weight 245 lb 3.2 oz (111.2 kg)    Goal Weight: Short Term 240 lb (108.9 kg)    Goal Weight: Long Term 220 lb (99.8  kg)    Expected Outcomes Short Term: Continue to assess and modify interventions until short term weight is achieved;Long Term: Adherence to nutrition and physical activity/exercise program aimed toward attainment of established weight goal;Weight Loss: Understanding of general recommendations for a balanced deficit meal plan, which promotes 1-2 lb weight loss per week and includes a negative energy balance of [phone] kcal/d;Understanding recommendations for meals to include 15-35% energy as protein, 25-35% energy from fat, 35-60% energy from carbohydrates, less than '200mg'$  of dietary cholesterol, 20-35 gm of total fiber daily;Understanding of distribution of calorie intake throughout the day with the consumption of 4-5 meals/snacks    Lipids Yes    Intervention Provide education and support for participant on nutrition & aerobic/resistive exercise along with prescribed medications to achieve LDL '70mg'$ , HDL >'40mg'$ .    Expected Outcomes Short Term: Participant states understanding of desired cholesterol values and is compliant with medications prescribed. Participant is following exercise prescription and nutrition guidelines.;Long Term: Cholesterol controlled with medications as prescribed, with individualized exercise RX and with personalized nutrition plan. Value goals: LDL < '70mg'$ , HDL > 40 mg.             Education:Diabetes - Individual verbal and written instruction to review signs/symptoms of diabetes, desired ranges of glucose level fasting, after meals and with exercise. Acknowledge that pre and post exercise glucose checks will be done for 3 sessions at entry of program.   Core Components/Risk Factors/Patient Goals Review:    Core Components/Risk Factors/Patient Goals at Discharge (Final Review):    ITP Comments:  ITP Comments     Row Name 05/13/22 1412 05/23/22 1508         ITP Comments Initial telephone orientation completed. Diagnosis can be found in [facility] 7/31. EP orientation  scheduled for Monday 8/28 at 1:30pm. Completed 6MWT and gym orientation. Initial ITP created and sent for review to Dr. Emily Filbert, Medical Director.               Comments: initial ITP

## 2022-05-23 NOTE — Patient Instructions (Signed)
Patient Instructions  Patient Details  Name: [name] MRN: [id number] Date of Birth: January 02, 1946 Referring Provider:  Lelon Perla, MD  Below are your personal goals for exercise, nutrition, and risk factors. Our goal is to help you stay on track towards obtaining and maintaining these goals. We will be discussing your progress on these goals with you throughout the program.  Initial Exercise Prescription:  Initial Exercise Prescription - 05/23/22 1500       Date of Initial Exercise RX and Referring Provider   Date 05/23/22    Referring Provider Dr. Kirk Ruths      Oxygen   Maintain Oxygen Saturation 88% or higher      NuStep   Level 2    SPM 80    Minutes 15    METs 2.31      REL-XR   Level 1    Speed 50    Minutes 15    METs 2.31      Track   Laps 35    Minutes 15    METs 2.9      Prescription Details   Frequency (times per week) 2    Duration Progress to 30 minutes of continuous aerobic without signs/symptoms of physical distress      Intensity   THRR 40-80% of Max Heartrate 100-130    Ratings of Perceived Exertion 11-13    Perceived Dyspnea 0-4      Progression   Progression Continue to progress workloads to maintain intensity without signs/symptoms of physical distress.      Resistance Training   Training Prescription Yes    Weight 3    Reps 10-15             Exercise Goals: Frequency: Be able to perform aerobic exercise two to three times per week in program working toward 2-5 days per week of home exercise.  Intensity: Work with a perceived exertion of 11 (fairly light) - 15 (hard) while following your exercise prescription.  We will make changes to your prescription with you as you progress through the program.   Duration: Be able to do 30 to 45 minutes of continuous aerobic exercise in addition to a 5 minute warm-up and a 5 minute cool-down routine.   Nutrition Goals: Your personal nutrition goals will be established  when you do your nutrition analysis with the dietician.  The following are general nutrition guidelines to follow: Cholesterol < '200mg'$ /day Sodium < '1500mg'$ /day Fiber: Women over 50 yrs - 21 grams per day  Personal Goals:  Personal Goals and Risk Factors at Admission - 05/23/22 1519       Core Components/Risk Factors/Patient Goals on Admission    Weight Management Yes    Intervention Weight Management: Develop a combined nutrition and exercise program designed to reach desired caloric intake, while maintaining appropriate intake of nutrient and fiber, sodium and fats, and appropriate energy expenditure required for the weight goal.;Weight Management: Provide education and appropriate resources to help participant work on and attain dietary goals.;Weight Management/Obesity: Establish reasonable short term and long term weight goals.;Obesity: Provide education and appropriate resources to help participant work on and attain dietary goals.    Admit Weight 245 lb 3.2 oz (111.2 kg)    Goal Weight: Short Term 240 lb (108.9 kg)    Goal Weight: Long Term 220 lb (99.8 kg)    Expected Outcomes Short Term: Continue to assess and modify interventions until short term weight is achieved;Long Term: Adherence to nutrition  and physical activity/exercise program aimed toward attainment of established weight goal;Weight Loss: Understanding of general recommendations for a balanced deficit meal plan, which promotes 1-2 lb weight loss per week and includes a negative energy balance of [phone] kcal/d;Understanding recommendations for meals to include 15-35% energy as protein, 25-35% energy from fat, 35-60% energy from carbohydrates, less than '200mg'$  of dietary cholesterol, 20-35 gm of total fiber daily;Understanding of distribution of calorie intake throughout the day with the consumption of 4-5 meals/snacks    Lipids Yes    Intervention Provide education and support for participant on nutrition & aerobic/resistive  exercise along with prescribed medications to achieve LDL '70mg'$ , HDL >'40mg'$ .    Expected Outcomes Short Term: Participant states understanding of desired cholesterol values and is compliant with medications prescribed. Participant is following exercise prescription and nutrition guidelines.;Long Term: Cholesterol controlled with medications as prescribed, with individualized exercise RX and with personalized nutrition plan. Value goals: LDL < '70mg'$ , HDL > 40 mg.             Tobacco Use Initial Evaluation: Social History   Tobacco Use  Smoking Status Never  Smokeless Tobacco Never    Exercise Goals and Review:  Exercise Goals     Row Name 05/23/22 1513             Exercise Goals   Increase Physical Activity Yes       Intervention Provide advice, education, support and counseling about physical activity/exercise needs.;Develop an individualized exercise prescription for aerobic and resistive training based on initial evaluation findings, risk stratification, comorbidities and participant's personal goals.       Expected Outcomes Short Term: Attend rehab on a regular basis to increase amount of physical activity.;Long Term: Add in home exercise to make exercise part of routine and to increase amount of physical activity.;Long Term: Exercising regularly at least 3-5 days a week.       Increase Strength and Stamina Yes       Intervention Provide advice, education, support and counseling about physical activity/exercise needs.;Develop an individualized exercise prescription for aerobic and resistive training based on initial evaluation findings, risk stratification, comorbidities and participant's personal goals.       Expected Outcomes Short Term: Increase workloads from initial exercise prescription for resistance, speed, and METs.;Short Term: Perform resistance training exercises routinely during rehab and add in resistance training at home;Long Term: Improve cardiorespiratory fitness,  muscular endurance and strength as measured by increased METs and functional capacity (6MWT)       Able to understand and use rate of perceived exertion (RPE) scale Yes       Intervention Provide education and explanation on how to use RPE scale       Expected Outcomes Short Term: Able to use RPE daily in rehab to express subjective intensity level;Long Term:  Able to use RPE to guide intensity level when exercising independently       Able to understand and use Dyspnea scale Yes       Intervention Provide education and explanation on how to use Dyspnea scale       Expected Outcomes Short Term: Able to use Dyspnea scale daily in rehab to express subjective sense of shortness of breath during exertion;Long Term: Able to use Dyspnea scale to guide intensity level when exercising independently       Knowledge and understanding of Target Heart Rate Range (THRR) Yes       Intervention Provide education and explanation of THRR including how the numbers were  predicted and where they are located for reference       Expected Outcomes Short Term: Able to state/look up THRR;Short Term: Able to use daily as guideline for intensity in rehab;Long Term: Able to use THRR to govern intensity when exercising independently       Able to check pulse independently Yes       Intervention Provide education and demonstration on how to check pulse in carotid and radial arteries.;Review the importance of being able to check your own pulse for safety during independent exercise       Expected Outcomes Short Term: Able to explain why pulse checking is important during independent exercise;Long Term: Able to check pulse independently and accurately       Understanding of Exercise Prescription Yes       Intervention Provide education, explanation, and written materials on patient's individual exercise prescription       Expected Outcomes Short Term: Able to explain program exercise prescription;Long Term: Able to explain home  exercise prescription to exercise independently                Copy of goals given to participant.

## 2022-05-25 ENCOUNTER — Encounter: Payer: PPO | Admitting: *Deleted

## 2022-05-25 DIAGNOSIS — E785 Hyperlipidemia, unspecified: Secondary | ICD-10-CM | POA: Diagnosis not present

## 2022-05-25 DIAGNOSIS — Z955 Presence of coronary angioplasty implant and graft: Secondary | ICD-10-CM

## 2022-05-25 DIAGNOSIS — Z48812 Encounter for surgical aftercare following surgery on the circulatory system: Secondary | ICD-10-CM | POA: Diagnosis not present

## 2022-05-25 LAB — HEPATIC FUNCTION PANEL
ALT: 23 IU/L (ref 0–32)
AST: 25 IU/L (ref 0–40)
Albumin: 4 g/dL (ref 3.8–4.8)
Alkaline Phosphatase: 62 IU/L (ref 44–121)
Bilirubin Total: 0.6 mg/dL (ref 0.0–1.2)
Bilirubin, Direct: 0.18 mg/dL (ref 0.00–0.40)
Total Protein: 6.2 g/dL (ref 6.0–8.5)

## 2022-05-25 LAB — LIPID PANEL
Chol/HDL Ratio: 2.1 ratio (ref 0.0–4.4)
Cholesterol, Total: 120 mg/dL (ref 100–199)
HDL: 56 mg/dL (ref >39)
LDL Chol Calc (NIH): 50 mg/dL (ref 0–99)
Triglycerides: 68 mg/dL (ref 0–149)
VLDL Cholesterol Cal: 14 mg/dL (ref 5–40)

## 2022-05-25 NOTE — Progress Notes (Signed)
Daily Session Note  Patient Details  Name: [name] MRN: [id number] Date of Birth: Jul 27, 1946 Referring Provider:   Flowsheet Row Cardiac Rehab from 05/23/2022 in [facility]  Referring Provider Dr. Kirk Ruths       Encounter Date: 05/25/2022  Check In:  Session Check In - 05/25/22 1336       Check-In   Supervising physician immediately available to respond to emergencies See telemetry face sheet for immediately available ER MD    Location [facility]    Staff Present Renita Papa, RN Margurite Auerbach, MS, ASCM CEP, Exercise Physiologist;Jessica Luan Pulling, MA, RCEP, CCRP, CCET    Virtual Visit No    Medication changes reported     No    Fall or balance concerns reported    No    Warm-up and Cool-down Performed on first and last piece of equipment    Resistance Training Performed Yes    VAD Patient? No    PAD/SET Patient? No      Pain Assessment   Currently in Pain? No/denies                Social History   Tobacco Use  Smoking Status Never  Smokeless Tobacco Never    Goals Met:  Independence with exercise equipment Exercise tolerated well No report of concerns or symptoms today Strength training completed today  Goals Unmet:  Not Applicable  Comments: First full day of exercise!  Patient was oriented to gym and equipment including functions, settings, policies, and procedures.  Patient's individual exercise prescription and treatment plan were reviewed.  All starting workloads were established based on the results of the 6 minute walk test done at initial orientation visit.  The plan for exercise progression was also introduced and progression will be customized based on patient's performance and goals.'   Dr. Emily Filbert is Medical Director for [facility].  Dr. Ottie Glazier is Medical Director for [facility].

## 2022-05-26 ENCOUNTER — Encounter: Payer: PPO | Admitting: *Deleted

## 2022-05-26 DIAGNOSIS — Z48812 Encounter for surgical aftercare following surgery on the circulatory system: Secondary | ICD-10-CM | POA: Diagnosis not present

## 2022-05-26 DIAGNOSIS — Z955 Presence of coronary angioplasty implant and graft: Secondary | ICD-10-CM

## 2022-05-26 NOTE — Progress Notes (Signed)
Daily Session Note  Patient Details  Name: [name] MRN: [id number] Date of Birth: 12/03/1945 Referring Provider:   Flowsheet Row Cardiac Rehab from 05/23/2022 in [facility]  Referring Provider Dr. Brian Crenshaw       Encounter Date: 05/26/2022  Check In:  Session Check In - 05/26/22 1344       Check-In   Supervising physician immediately available to respond to emergencies See telemetry face sheet for immediately available ER MD    Location [facility]    Staff Present Meredith Craven, RN BSN;Joseph Hood, RCP,RRT,BSRT;Jessica Hawkins, MA, RCEP, CCRP, CCET    Virtual Visit No    Medication changes reported     No    Fall or balance concerns reported    No    Warm-up and Cool-down Performed on first and last piece of equipment    Resistance Training Performed Yes    VAD Patient? No    PAD/SET Patient? No      Pain Assessment   Currently in Pain? No/denies                Social History   Tobacco Use  Smoking Status Never  Smokeless Tobacco Never    Goals Met:  Independence with exercise equipment Exercise tolerated well No report of concerns or symptoms today Strength training completed today  Goals Unmet:  Not Applicable  Comments: Pt able to follow exercise prescription today without complaint.  Will continue to monitor for progression.    Dr. Mark Miller is Medical Director for [facility].  Dr. Fuad Aleskerov is Medical Director for [facility]. 

## 2022-06-01 ENCOUNTER — Encounter: Payer: PPO | Attending: Cardiology | Admitting: *Deleted

## 2022-06-01 ENCOUNTER — Encounter: Payer: Self-pay | Admitting: Nurse Practitioner

## 2022-06-01 ENCOUNTER — Encounter: Payer: Self-pay | Admitting: *Deleted

## 2022-06-01 ENCOUNTER — Ambulatory Visit: Payer: PPO | Attending: Nurse Practitioner | Admitting: Nurse Practitioner

## 2022-06-01 VITALS — BP 128/82 | HR 62 | Ht 68.0 in | Wt 244.6 lb

## 2022-06-01 DIAGNOSIS — R7303 Prediabetes: Secondary | ICD-10-CM | POA: Diagnosis not present

## 2022-06-01 DIAGNOSIS — Z48812 Encounter for surgical aftercare following surgery on the circulatory system: Secondary | ICD-10-CM | POA: Diagnosis not present

## 2022-06-01 DIAGNOSIS — I2510 Atherosclerotic heart disease of native coronary artery without angina pectoris: Secondary | ICD-10-CM | POA: Diagnosis not present

## 2022-06-01 DIAGNOSIS — D696 Thrombocytopenia, unspecified: Secondary | ICD-10-CM

## 2022-06-01 DIAGNOSIS — D649 Anemia, unspecified: Secondary | ICD-10-CM

## 2022-06-01 DIAGNOSIS — E785 Hyperlipidemia, unspecified: Secondary | ICD-10-CM

## 2022-06-01 DIAGNOSIS — Z955 Presence of coronary angioplasty implant and graft: Secondary | ICD-10-CM | POA: Diagnosis not present

## 2022-06-01 DIAGNOSIS — R030 Elevated blood-pressure reading, without diagnosis of hypertension: Secondary | ICD-10-CM

## 2022-06-01 NOTE — Patient Instructions (Signed)
Medication Instructions:  Your physician recommends that you continue on your current medications as directed. Please refer to the Current Medication list given to you today.  *If you need a refill on your cardiac medications before your next appointment, please call your pharmacy*   Lab Work: CBC If you have labs (blood work) drawn today and your tests are completely normal, you will receive your results only by: Wall (if you have MyChart) OR A paper copy in the mail If you have any lab test that is abnormal or we need to change your treatment, we will call you to review the results.   Testing/Procedures: NONE   Follow-Up: At [facility], you and your health needs are our priority.  As part of our continuing mission to provide you with exceptional heart care, we have created designated Provider Care Teams.  These Care Teams include your primary Cardiologist (physician) and Advanced Practice Providers (APPs -  Physician Assistants and Nurse Practitioners) who all work together to provide you with the care you need, when you need it.  We recommend signing up for the patient portal called "MyChart".  Sign up information is provided on this After Visit Summary.  MyChart is used to connect with patients for Virtual Visits (Telemedicine).  Patients are able to view lab/test results, encounter notes, upcoming appointments, etc.  Non-urgent messages can be sent to your provider as well.   To learn more about what you can do with MyChart, go to [email or web address].    Your next appointment:   4-6 month(s)  The format for your next appointment:   In Person  Provider:   Kirk Ruths, MD

## 2022-06-01 NOTE — Progress Notes (Signed)
Cardiac Individual Treatment Plan  Patient Details  Name: [name] MRN: [id number] Date of Birth: 1945-10-23 Referring Provider:   Flowsheet Row Cardiac Rehab from 05/23/2022 in [facility] Cardiac and Pulmonary Rehab  Referring Provider Dr. Kirk Ruths       Initial Encounter Date:  Flowsheet Row Cardiac Rehab from 05/23/2022 in [facility] Cardiac and Pulmonary Rehab  Date 05/23/22       Visit Diagnosis: Status post coronary artery stent placement  Patient's Home Medications on Admission:  Current Outpatient Medications:    aspirin 81 MG tablet, Take 81 mg by mouth daily., Disp: , Rfl:    ezetimibe (ZETIA) 10 MG tablet, Take 1 tablet (10 mg total) by mouth daily., Disp: 90 tablet, Rfl: 3   levothyroxine (SYNTHROID) 50 MCG tablet, TAKE 1 TABLET BY MOUTH EVERY DAY, Disp: 90 tablet, Rfl: 3   metoprolol succinate (TOPROL XL) 25 MG 24 hr tablet, Take 1 tablet (25 mg total) by mouth daily., Disp: 90 tablet, Rfl: 3   omeprazole (PRILOSEC) 20 MG capsule, Take 1 capsule (20 mg total) by mouth daily as needed. (Patient taking differently: Take 20 mg by mouth daily.), Disp: 90 capsule, Rfl: 3   rosuvastatin (CRESTOR) 40 MG tablet, Take 1 tablet (40 mg total) by mouth daily., Disp: 90 tablet, Rfl: 3   ticagrelor (BRILINTA) 90 MG TABS tablet, Take 1 tablet (90 mg total) by mouth 2 (two) times daily., Disp: 60 tablet, Rfl: 11  Past Medical History: 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 ( Dr Tonia Brooms)   Subclinical hypothyroidism 05/17/2011   Venous insufficiency of right leg 06/23/2017   Follows with vascular   Weakness     Tobacco Use: Social History   Tobacco Use   Smoking Status Never  Smokeless Tobacco Never    Labs: Review Flowsheet  More data exists      Latest Ref Rng & Units 01/01/2020 10/21/2020 04/16/2021 01/31/2022 05/25/2022  Labs for ITP Cardiac and Pulmonary Rehab  Cholestrol 100 - 199 mg/dL - 208  221  219  120   LDL (calc) 0 - 99 mg/dL - 125  135  136  50   HDL-C >39 mg/dL - 64.60  65.00  64.00  56   Trlycerides 0 - 149 mg/dL - 93.0  102.0  92.0  68   Hemoglobin A1c 4.6 - 6.5 % 5.6  5.8  5.8  5.8  -     Exercise Target Goals: Exercise Program Goal: Individual exercise prescription set using results from initial 6 min walk test and THRR while considering  patient's activity barriers and safety.   Exercise Prescription Goal: Initial exercise prescription builds to 30-45 minutes a day of aerobic activity, 2-3 days per week.  Home exercise guidelines will be given to patient during program as part of exercise prescription that the participant will acknowledge.   Education: Aerobic Exercise: - Group verbal and visual presentation on the components of exercise prescription. Introduces F.I.T.T principle from ACSM for exercise prescriptions.  Reviews F.I.T.T. principles of aerobic exercise including progression. Written material given at graduation.   Education: Resistance Exercise: - Group verbal  and visual presentation on the components of exercise prescription. Introduces F.I.T.T principle from ACSM for exercise prescriptions  Reviews F.I.T.T. principles of resistance exercise including progression. Written material given at graduation.    Education: Exercise & Equipment Safety: - Individual verbal instruction and demonstration of equipment use and safety with use of the equipment. Flowsheet Row Cardiac Rehab from 05/25/2022 in [facility] Cardiac and Pulmonary Rehab  Date 05/23/22  Educator [facility]  Instruction Review Code 1- Verbalizes Understanding       Education: Exercise Physiology & General Exercise Guidelines: - Group verbal and  written instruction with models to review the exercise physiology of the cardiovascular system and associated critical values. Provides general exercise guidelines with specific guidelines to those with heart or lung disease.  Flowsheet Row Cardiac Rehab from 05/25/2022 in [facility] Cardiac and Pulmonary Rehab  Education need identified 05/23/22  Date 05/25/22  Educator [place]  Instruction Review Code 1- Verbalizes Understanding       Education: Flexibility, Balance, Mind/Body Relaxation: - Group verbal and visual presentation with interactive activity on the components of exercise prescription. Introduces F.I.T.T principle from ACSM for exercise prescriptions. Reviews F.I.T.T. principles of flexibility and balance exercise training including progression. Also discusses the mind body connection.  Reviews various relaxation techniques to help reduce and manage stress (i.e. Deep breathing, progressive muscle relaxation, and visualization). Balance handout provided to take home. Written material given at graduation.   Activity Barriers & Risk Stratification:  Activity Barriers & Cardiac Risk Stratification - 05/13/22 1413       Activity Barriers & Cardiac Risk Stratification   Activity Barriers None    Cardiac Risk Stratification Moderate             6 Minute Walk:  6 Minute Walk     Row Name 05/23/22 1509         6 Minute Walk   Phase Initial     Distance 1340 feet     Walk Time 6 minutes     # of Rest Breaks 0     MPH 2.54     METS 2.31     RPE 10     Perceived Dyspnea  0     VO2 Peak 8.08     Symptoms No     Resting HR 70 bpm     Resting BP 122/80     Resting Oxygen Saturation  98 %     Exercise Oxygen Saturation  during 6 min walk 97 %     Max Ex. HR 98 bpm     Max Ex. BP 138/72     2 Minute Post BP 128/72              Oxygen Initial Assessment:   Oxygen Re-Evaluation:   Oxygen Discharge (Final Oxygen Re-Evaluation):   Initial Exercise Prescription:   Initial Exercise Prescription - 05/23/22 1500       Date of Initial Exercise RX and Referring Provider   Date 05/23/22    Referring Provider Dr. Kirk Ruths      Oxygen   Maintain Oxygen Saturation 88% or higher      NuStep   Level 2    SPM 80    Minutes 15    METs 2.31      REL-XR   Level 1    Speed 50    Minutes 15    METs 2.31      Track   Laps 35    Minutes 15  METs 2.9      Prescription Details   Frequency (times per week) 2    Duration Progress to 30 minutes of continuous aerobic without signs/symptoms of physical distress      Intensity   THRR 40-80% of Max Heartrate 100-130    Ratings of Perceived Exertion 11-13    Perceived Dyspnea 0-4      Progression   Progression Continue to progress workloads to maintain intensity without signs/symptoms of physical distress.      Resistance Training   Training Prescription Yes    Weight 3    Reps 10-15             Perform Capillary Blood Glucose checks as needed.  Exercise Prescription Changes:   Exercise Prescription Changes     Row Name 05/23/22 1500             Response to Exercise   Blood Pressure (Admit) 122/80       Blood Pressure (Exercise) 138/72       Blood Pressure (Exit) 128/72       Heart Rate (Admit) 70 bpm       Heart Rate (Exercise) 109 bpm       Heart Rate (Exit) 72 bpm       Oxygen Saturation (Admit) 98 %       Oxygen Saturation (Exercise) 97 %       Oxygen Saturation (Exit) 98 %       Rating of Perceived Exertion (Exercise) 10       Perceived Dyspnea (Exercise) 0       Symptoms none       Comments 6 MWT results                Exercise Comments:   Exercise Goals and Review:   Exercise Goals     Row Name 05/23/22 1513             Exercise Goals   Increase Physical Activity Yes       Intervention Provide advice, education, support and counseling about physical activity/exercise needs.;Develop an individualized exercise prescription for aerobic and  resistive training based on initial evaluation findings, risk stratification, comorbidities and participant's personal goals.       Expected Outcomes Short Term: Attend rehab on a regular basis to increase amount of physical activity.;Long Term: Add in home exercise to make exercise part of routine and to increase amount of physical activity.;Long Term: Exercising regularly at least 3-5 days a week.       Increase Strength and Stamina Yes       Intervention Provide advice, education, support and counseling about physical activity/exercise needs.;Develop an individualized exercise prescription for aerobic and resistive training based on initial evaluation findings, risk stratification, comorbidities and participant's personal goals.       Expected Outcomes Short Term: Increase workloads from initial exercise prescription for resistance, speed, and METs.;Short Term: Perform resistance training exercises routinely during rehab and add in resistance training at home;Long Term: Improve cardiorespiratory fitness, muscular endurance and strength as measured by increased METs and functional capacity (6MWT)       Able to understand and use rate of perceived exertion (RPE) scale Yes       Intervention Provide education and explanation on how to use RPE scale       Expected Outcomes Short Term: Able to use RPE daily in rehab to express subjective intensity level;Long Term:  Able to use RPE to guide intensity level when exercising  independently       Able to understand and use Dyspnea scale Yes       Intervention Provide education and explanation on how to use Dyspnea scale       Expected Outcomes Short Term: Able to use Dyspnea scale daily in rehab to express subjective sense of shortness of breath during exertion;Long Term: Able to use Dyspnea scale to guide intensity level when exercising independently       Knowledge and understanding of Target Heart Rate Range (THRR) Yes       Intervention Provide education and  explanation of THRR including how the numbers were predicted and where they are located for reference       Expected Outcomes Short Term: Able to state/look up THRR;Short Term: Able to use daily as guideline for intensity in rehab;Long Term: Able to use THRR to govern intensity when exercising independently       Able to check pulse independently Yes       Intervention Provide education and demonstration on how to check pulse in carotid and radial arteries.;Review the importance of being able to check your own pulse for safety during independent exercise       Expected Outcomes Short Term: Able to explain why pulse checking is important during independent exercise;Long Term: Able to check pulse independently and accurately       Understanding of Exercise Prescription Yes       Intervention Provide education, explanation, and written materials on patient's individual exercise prescription       Expected Outcomes Short Term: Able to explain program exercise prescription;Long Term: Able to explain home exercise prescription to exercise independently                Exercise Goals Re-Evaluation :  Exercise Goals Re-Evaluation     Row Name 05/25/22 1337             Exercise Goal Re-Evaluation   Exercise Goals Review Increase Physical Activity;Able to understand and use rate of perceived exertion (RPE) scale;Knowledge and understanding of Target Heart Rate Range (THRR);Understanding of Exercise Prescription;Increase Strength and Stamina;Able to check pulse independently       Comments Reviewed RPE and dyspnea scales, THR and program prescription with pt today.  Pt voiced understanding and was given a copy of goals to take home.       Expected Outcomes Short: Use RPE daily to regulate intensity. Long: Follow program prescription in THR.                Discharge Exercise Prescription (Final Exercise Prescription Changes):  Exercise Prescription Changes - 05/23/22 1500       Response to  Exercise   Blood Pressure (Admit) 122/80    Blood Pressure (Exercise) 138/72    Blood Pressure (Exit) 128/72    Heart Rate (Admit) 70 bpm    Heart Rate (Exercise) 109 bpm    Heart Rate (Exit) 72 bpm    Oxygen Saturation (Admit) 98 %    Oxygen Saturation (Exercise) 97 %    Oxygen Saturation (Exit) 98 %    Rating of Perceived Exertion (Exercise) 10    Perceived Dyspnea (Exercise) 0    Symptoms none    Comments 6 MWT results             Nutrition:  Target Goals: Understanding of nutrition guidelines, daily intake of sodium '1500mg'$ , cholesterol '200mg'$ , calories 30% from fat and 7% or less from saturated fats, daily to have 5 or more  servings of fruits and vegetables.  Education: All About Nutrition: -Group instruction provided by verbal, written material, interactive activities, discussions, models, and posters to present general guidelines for heart healthy nutrition including fat, fiber, MyPlate, the role of sodium in heart healthy nutrition, utilization of the nutrition label, and utilization of this knowledge for meal planning. Follow up email sent as well. Written material given at graduation. Flowsheet Row Cardiac Rehab from 05/25/2022 in [facility] Cardiac and Pulmonary Rehab  Education need identified 05/23/22       Biometrics:  Pre Biometrics - 05/23/22 1514       Pre Biometrics   Height 5' 7.75" (1.721 m)    Weight 245 lb 3.2 oz (111.2 kg)    Waist Circumference 42.25 inches    Hip Circumference 55 inches    Waist to Hip Ratio 0.77 %    BMI (Calculated) 37.55    Single Leg Stand 4.42 seconds              Nutrition Therapy Plan and Nutrition Goals:  Nutrition Therapy & Goals - 05/23/22 1520       Intervention Plan   Intervention Prescribe, educate and counsel regarding individualized specific dietary modifications aiming towards targeted core components such as weight, hypertension, lipid management, diabetes, heart failure and other comorbidities.    Expected  Outcomes Short Term Goal: Understand basic principles of dietary content, such as calories, fat, sodium, cholesterol and nutrients.;Short Term Goal: A plan has been developed with personal nutrition goals set during dietitian appointment.;Long Term Goal: Adherence to prescribed nutrition plan.             Nutrition Assessments:  MEDIFICTS Score Key: ?70 Need to make dietary changes  40-70 Heart Healthy Diet ? 40 Therapeutic Level Cholesterol Diet  Flowsheet Row Cardiac Rehab from 05/23/2022 in [facility] Cardiac and Pulmonary Rehab  Picture Your Plate Total Score on Admission 64      Picture Your Plate Scores: <31 Unhealthy dietary pattern with much room for improvement. 41-50 Dietary pattern unlikely to meet recommendations for good health and room for improvement. 51-60 More healthful dietary pattern, with some room for improvement.  >60 Healthy dietary pattern, although there may be some specific behaviors that could be improved.    Nutrition Goals Re-Evaluation:   Nutrition Goals Discharge (Final Nutrition Goals Re-Evaluation):   Psychosocial: Target Goals: Acknowledge presence or absence of significant depression and/or stress, maximize coping skills, provide positive support system. Participant is able to verbalize types and ability to use techniques and skills needed for reducing stress and depression.   Education: Stress, Anxiety, and Depression - Group verbal and visual presentation to define topics covered.  Reviews how body is impacted by stress, anxiety, and depression.  Also discusses healthy ways to reduce stress and to treat/manage anxiety and depression.  Written material given at graduation.   Education: Sleep Hygiene -Provides group verbal and written instruction about how sleep can affect your health.  Define sleep hygiene, discuss sleep cycles and impact of sleep habits. Review good sleep hygiene tips.    Initial Review & Psychosocial Screening:  Initial  Psych Review & Screening - 05/13/22 1420       Initial Review   Current issues with Current Stress Concerns      Family Dynamics   Good Support System? Yes      Barriers   Psychosocial barriers to participate in program There are no identifiable barriers or psychosocial needs.;The patient should benefit from training in stress management and relaxation.  Screening Interventions   Interventions Encouraged to exercise;Provide feedback about the scores to participant;To provide support and resources with identified psychosocial needs    Expected Outcomes Short Term goal: Utilizing psychosocial counselor, staff and physician to assist with identification of specific Stressors or current issues interfering with healing process. Setting desired goal for each stressor or current issue identified.;Long Term Goal: Stressors or current issues are controlled or eliminated.;Short Term goal: Identification and review with participant of any Quality of Life or Depression concerns found by scoring the questionnaire.;Long Term goal: The participant improves quality of Life and PHQ9 Scores as seen by post scores and/or verbalization of changes             Quality of Life Scores:   Quality of Life - 05/23/22 1517       Quality of Life   Select Quality of Life      Quality of Life Scores   Health/Function Pre 25.71 %    Socioeconomic Pre 27 %    Psych/Spiritual Pre 26.57 %    Family Pre 24 %    GLOBAL Pre 26.14 %            Scores of 19 and below usually indicate a poorer quality of life in these areas.  A difference of  2-3 points is a clinically meaningful difference.  A difference of 2-3 points in the total score of the Quality of Life Index has been associated with significant improvement in overall quality of life, self-image, physical symptoms, and general health in studies assessing change in quality of life.  PHQ-9: Review Flowsheet  More data exists      05/23/2022 04/12/2022  03/23/2021 04/15/2020 01/28/2020  Depression screen PHQ 2/9  Decreased Interest 1 0 0 0 0  Down, Depressed, Hopeless 1 0 0 0 0  PHQ - 2 Score 2 0 0 0 0  Altered sleeping 0 - - 0 -  Tired, decreased energy 1 - - 0 -  Change in appetite 0 - - 0 -  Feeling bad or failure about yourself  0 - - 0 -  Trouble concentrating 1 - - 0 -  Moving slowly or fidgety/restless 0 - - 0 -  Suicidal thoughts 0 - - 0 -  PHQ-9 Score 4 - - 0 -  Difficult doing work/chores Not difficult at all - - Not difficult at all -   Interpretation of Total Score  Total Score Depression Severity:  1-4 = Minimal depression, 5-9 = Mild depression, 10-14 = Moderate depression, 15-19 = Moderately severe depression, 20-27 = Severe depression   Psychosocial Evaluation and Intervention:  Psychosocial Evaluation - 05/13/22 1436       Psychosocial Evaluation & Interventions   Interventions Encouraged to exercise with the program and follow exercise prescription;Stress management education;Relaxation education    Comments Sheretha Shadd has only been on thyroid medication until her recent stent which led to being put on blood pressure, cholesteral and blood thinner medication. She really wants to get off as much medication as she can as she doesn't like the side effects. She has a history of increased stress. She states she realized something was off in the spring when she was putting out mulch and kept getting short of breath which prompted her to seek evaluation from her MD. She feels thankful it was just a stent and not bypass like her husband (who has passed now) needed years ago. She is very interested in the education component, especially nutrition.  Expected Outcomes Short: attend cardiac rehab for education and exercise. Long: develop and maintian positive self care habits.    Continue Psychosocial Services  Follow up required by staff             Psychosocial Re-Evaluation:   Psychosocial Discharge (Final Psychosocial  Re-Evaluation):   Vocational Rehabilitation: Provide vocational rehab assistance to qualifying candidates.   Vocational Rehab Evaluation & Intervention:  Vocational Rehab - 05/13/22 1420       Initial Vocational Rehab Evaluation & Intervention   Assessment shows need for Vocational Rehabilitation No             Education: Education Goals: Education classes will be provided on a variety of topics geared toward better understanding of heart health and risk factor modification. Participant will state understanding/return demonstration of topics presented as noted by education test scores.  Learning Barriers/Preferences:  Learning Barriers/Preferences - 05/13/22 1420       Learning Barriers/Preferences   Learning Barriers None    Learning Preferences Individual Instruction             General Cardiac Education Topics:  AED/CPR: - Group verbal and written instruction with the use of models to demonstrate the basic use of the AED with the basic ABC's of resuscitation.   Anatomy and Cardiac Procedures: - Group verbal and visual presentation and models provide information about basic cardiac anatomy and function. Reviews the testing methods done to diagnose heart disease and the outcomes of the test results. Describes the treatment choices: Medical Management, Angioplasty, or Coronary Bypass Surgery for treating various heart conditions including Myocardial Infarction, Angina, Valve Disease, and Cardiac Arrhythmias.  Written material given at graduation. Flowsheet Row Cardiac Rehab from 05/25/2022 in [facility] Cardiac and Pulmonary Rehab  Education need identified 05/23/22       Medication Safety: - Group verbal and visual instruction to review commonly prescribed medications for heart and lung disease. Reviews the medication, class of the drug, and side effects. Includes the steps to properly store meds and maintain the prescription regimen.  Written material given at  graduation.   Intimacy: - Group verbal instruction through game format to discuss how heart and lung disease can affect sexual intimacy. Written material given at graduation.. Flowsheet Row Cardiac Rehab from 05/25/2022 in [facility] Cardiac and Pulmonary Rehab  Education need identified 05/23/22       Know Your Numbers and Heart Failure: - Group verbal and visual instruction to discuss disease risk factors for cardiac and pulmonary disease and treatment options.  Reviews associated critical values for Overweight/Obesity, Hypertension, Cholesterol, and Diabetes.  Discusses basics of heart failure: signs/symptoms and treatments.  Introduces Heart Failure Zone chart for action plan for heart failure.  Written material given at graduation. Flowsheet Row Cardiac Rehab from 05/25/2022 in [facility] Cardiac and Pulmonary Rehab  Education need identified 05/23/22       Infection Prevention: - Provides verbal and written material to individual with discussion of infection control including proper hand washing and proper equipment cleaning during exercise session. Flowsheet Row Cardiac Rehab from 05/25/2022 in [facility] Cardiac and Pulmonary Rehab  Date 05/23/22  Educator [facility]  Instruction Review Code 1- Verbalizes Understanding       Falls Prevention: - Provides verbal and written material to individual with discussion of falls prevention and safety. Flowsheet Row Cardiac Rehab from 05/25/2022 in [facility] Cardiac and Pulmonary Rehab  Date 05/23/22  Educator [facility]  Instruction Review Code 1- Verbalizes Understanding       Other: -Provides  group and verbal instruction on various topics (see comments)   Knowledge Questionnaire Score:  Knowledge Questionnaire Score - 05/23/22 1518       Knowledge Questionnaire Score   Pre Score 19/26             Core Components/Risk Factors/Patient Goals at Admission:  Personal Goals and Risk Factors at Admission - 05/23/22 1519       Core Components/Risk  Factors/Patient Goals on Admission    Weight Management Yes    Intervention Weight Management: Develop a combined nutrition and exercise program designed to reach desired caloric intake, while maintaining appropriate intake of nutrient and fiber, sodium and fats, and appropriate energy expenditure required for the weight goal.;Weight Management: Provide education and appropriate resources to help participant work on and attain dietary goals.;Weight Management/Obesity: Establish reasonable short term and long term weight goals.;Obesity: Provide education and appropriate resources to help participant work on and attain dietary goals.    Admit Weight 245 lb 3.2 oz (111.2 kg)    Goal Weight: Short Term 240 lb (108.9 kg)    Goal Weight: Long Term 220 lb (99.8 kg)    Expected Outcomes Short Term: Continue to assess and modify interventions until short term weight is achieved;Long Term: Adherence to nutrition and physical activity/exercise program aimed toward attainment of established weight goal;Weight Loss: Understanding of general recommendations for a balanced deficit meal plan, which promotes 1-2 lb weight loss per week and includes a negative energy balance of [phone] kcal/d;Understanding recommendations for meals to include 15-35% energy as protein, 25-35% energy from fat, 35-60% energy from carbohydrates, less than '200mg'$  of dietary cholesterol, 20-35 gm of total fiber daily;Understanding of distribution of calorie intake throughout the day with the consumption of 4-5 meals/snacks    Lipids Yes    Intervention Provide education and support for participant on nutrition & aerobic/resistive exercise along with prescribed medications to achieve LDL '70mg'$ , HDL >'40mg'$ .    Expected Outcomes Short Term: Participant states understanding of desired cholesterol values and is compliant with medications prescribed. Participant is following exercise prescription and nutrition guidelines.;Long Term: Cholesterol  controlled with medications as prescribed, with individualized exercise RX and with personalized nutrition plan. Value goals: LDL < '70mg'$ , HDL > 40 mg.             Education:Diabetes - Individual verbal and written instruction to review signs/symptoms of diabetes, desired ranges of glucose level fasting, after meals and with exercise. Acknowledge that pre and post exercise glucose checks will be done for 3 sessions at entry of program.   Core Components/Risk Factors/Patient Goals Review:    Core Components/Risk Factors/Patient Goals at Discharge (Final Review):    ITP Comments:  ITP Comments     Row Name 05/13/22 1412 05/23/22 1508 05/25/22 1337 06/01/22 0753     ITP Comments Initial telephone orientation completed. Diagnosis can be found in [facility] 7/31. EP orientation scheduled for Monday 8/28 at 1:30pm. Completed 6MWT and gym orientation. Initial ITP created and sent for review to Dr. Emily Filbert, Medical Director. First full day of exercise!  Patient was oriented to gym and equipment including functions, settings, policies, and procedures.  Patient's individual exercise prescription and treatment plan were reviewed.  All starting workloads were established based on the results of the 6 minute walk test done at initial orientation visit.  The plan for exercise progression was also introduced and progression will be customized based on patient's performance and goals.' 30 Day review completed. Medical Director ITP review done, changes made  as directed, and signed approval by Market researcher.   NEW             Comments:

## 2022-06-01 NOTE — Progress Notes (Signed)
Daily Session Note  Patient Details  Name: [name] MRN: [id number] Date of Birth: 04/08/1946 Referring Provider:   Flowsheet Row Cardiac Rehab from 05/23/2022 in [facility] Cardiac and Pulmonary Rehab  Referring Provider Dr. Kirk Ruths       Encounter Date: 06/01/2022  Check In:  Session Check In - 06/01/22 1349       Check-In   Supervising physician immediately available to respond to emergencies See telemetry face sheet for immediately available ER MD    Location [facility]-Cardiac & Pulmonary Rehab    Staff Present Renita Papa, RN BSN;Joseph [place]illa, RCP,RRT,BSRT;Laureen [place], [place], RRT, CPFT    Virtual Visit No    Medication changes reported     No    Fall or balance concerns reported    No    Warm-up and Cool-down Performed on first and last piece of equipment    Resistance Training Performed Yes    VAD Patient? No    PAD/SET Patient? No      Pain Assessment   Currently in Pain? No/denies                Social History   Tobacco Use  Smoking Status Never  Smokeless Tobacco Never    Goals Met:  Independence with exercise equipment Exercise tolerated well No report of concerns or symptoms today Strength training completed today  Goals Unmet:  Not Applicable  Comments: Pt able to follow exercise prescription today without complaint.  Will continue to monitor for progression.    Dr. Emily Filbert is Medical Director for [place].  Dr. Ottie Glazier is Medical Director for [facility] Pulmonary Rehabilitation.

## 2022-06-01 NOTE — Progress Notes (Signed)
Office Visit    Patient Name: [name] Date of Encounter: 06/01/2022  Primary Care Provider:  Binnie Rail, MD Primary Cardiologist:  Kirk Ruths, MD  Chief Complaint    76 year old female with a history of CAD, chest pain, hyperlipidemia, prediabetes, GERD, clinical hypothyroidism, anxiety, depression, and venous insufficiency who presents for follow-up related to CAD.  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 ( Dr Tonia Brooms)   Subclinical hypothyroidism 05/17/2011   Venous insufficiency of right leg 06/23/2017   Follows with vascular   Weakness    Past Surgical History:  Procedure Laterality Date   CHOLECYSTECTOMY N/A 06/13/2017   Procedure: LAPAROSCOPIC CHOLECYSTECTOMY;  Surgeon: Vickie Epley, MD;  Location: [facility] ORS;  Service: General;  Laterality: N/A;   COLONOSCOPY     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/25/2022   Procedure: CORONARY STENT INTERVENTION;  Surgeon: Troy Sine, MD;  Location: [place] [facility];  Service: Cardiovascular;  Laterality: N/A;   LEFT HEART CATH AND CORONARY ANGIOGRAPHY N/A 04/25/2022   Procedure: LEFT HEART CATH AND CORONARY ANGIOGRAPHY;  Surgeon: Troy Sine, MD;  Location: [place] [facility];  Service: Cardiovascular;  Laterality: N/A;   MASS EXCISION     right upper inner thigh or cyst   WISDOM TOOTH EXTRACTION      Allergies  Allergies  Allergen Reactions   Atorvastatin Other (See Comments)    REACTION: Upset stomach   Augmentin [Amoxicillin-Pot Clavulanate] Diarrhea    History of Present Illness    76 year old female with the above past medical history including CAD,  chest pain, hyperlipidemia, prediabetes, GERD, clinical hypothyroidism, anxiety, depression, and venous insufficiency.   She was previously evaluated by cardiology in 2010. Nuclear study at the time showed EF 76%, no scar or ischemia.  She was referred by her PCP to Dr. Stanford Breed in May 2023 in the setting of chest pain concerning for angina.  She reported a 1 year history of chest tightness with vigorous activity, associated back tightness. Coronary CTA revealed coronary calcium score of 420, 85th percentile for age, sex, race matched control, FFR CT positive obstructive CAD and mid RCA, referral for cardiac catheterization was recommended.   She underwent cardiac catheterization on 04/25/2022 which revealed a 30% OM1 lesion, 20% p-mLAD lesion, 90% p-mRCA lesion s/p DES, 20% o-pRCA lesion, EF 65%.  She was started on DAPT with aspirin and Brilinta.  Crestor was increased to 40 mg daily. She was last seen in the office on 05/03/2022 and was stable from a cardiac standpoint. She denied any symptoms concerning for angina.  She reported significant swelling and pain near her radial cath site and was noted to have a hematoma. Her Lipoprotein (a) was elevated, she was started on Zetia.   She presents today for follow-up. Since her last visit he has been stable from a cardiac standpoint.  She denies any symptoms concerning for angina.  She is spitting in cardiac rehab and gradually increasing her activity tolerance.  She is working hard on losing weight walking regularly and following a heart healthy diet.  She does express some frustration that it is taking her longer than she anticipated to feel like herself again.  She has noted some intermittent lightheadedness, mild fatigue, otherwise, she is feeling well denies any additional concerns today.  Home Medications    Current Outpatient Medications  Medication Sig Dispense Refill   aspirin 81 MG tablet Take 81 mg by mouth daily.     ezetimibe (ZETIA) 10 MG tablet  Take 1 tablet (10 mg total) by mouth daily. 90 tablet 3   levothyroxine (SYNTHROID) 50 MCG tablet TAKE 1 TABLET BY MOUTH EVERY DAY 90 tablet 3   metoprolol succinate (TOPROL XL) 25 MG 24 hr tablet Take 1 tablet (25 mg total) by mouth daily. 90 tablet 3   omeprazole (PRILOSEC) 20 MG capsule Take 1 capsule (20 mg total) by mouth daily as needed. (Patient taking differently: Take 20 mg by mouth daily.) 90 capsule 3   rosuvastatin (CRESTOR) 40 MG tablet Take 1 tablet (40 mg total) by mouth daily. 90 tablet 3   ticagrelor (BRILINTA) 90 MG TABS tablet Take 1 tablet (90 mg total) by mouth 2 (two) times daily. 60 tablet 11   No current facility-administered medications for this visit.     Review of Systems    She denies chest pain, palpitations, dyspnea, pnd, orthopnea, n, v, dizziness, syncope, edema, weight gain, or early satiety. All other systems reviewed and are otherwise negative except as noted above.   Physical Exam    VS:  BP 128/82   Pulse 62   Ht '5\' 8"'$  (1.727 m)   Wt 244 lb 9.6 oz (110.9 kg)   SpO2 99%   BMI 37.19 kg/m  GEN: Well nourished, well developed, in no acute distress. HEENT: normal. Neck: Supple, no JVD, carotid bruits, or masses. Cardiac: RRR, no murmurs, rubs, or gallops. No clubbing, cyanosis, edema.  Radials/DP/PT 2+ and equal bilaterally.  Right radial cath site without bruising, bleeding, or hematoma. Respiratory:  Respirations regular and unlabored, clear to auscultation bilaterally. GI: Soft, nontender, nondistended, BS + x 4. MS: no deformity or atrophy. Skin: warm and dry, no rash. Neuro:  Strength and sensation are intact. Psych: Normal affect.  Accessory Clinical Findings    ECG personally reviewed by me today - No EKG in office today .   Lab Results  Component Value Date   WBC 5.1 04/26/2022   HGB 11.0 (L) 04/26/2022   HCT 34.1 (L) 04/26/2022   MCV 90.9 04/26/2022   PLT 124 (L) 04/26/2022   Lab Results  Component Value Date   CREATININE 0.84  04/26/2022   BUN 13 04/26/2022   NA 139 04/26/2022   K 3.9 04/26/2022   CL 111 04/26/2022   CO2 24 04/26/2022   Lab Results  Component Value Date   ALT 23 05/25/2022   AST 25 05/25/2022   ALKPHOS 62 05/25/2022   BILITOT 0.6 05/25/2022   Lab Results  Component Value Date   CHOL 120 05/25/2022   HDL 56 05/25/2022   LDLCALC 50 05/25/2022   LDLDIRECT 152.1 06/26/2013   TRIG 68 05/25/2022   CHOLHDL 2.1 05/25/2022    Lab Results  Component Value Date   HGBA1C 5.8 01/31/2022    Assessment & Plan    1. CAD/chest pain: S/p DES-p-mRCA. Stable with no anginal symptoms.  She is participating in cardiac rehab.  Continue aspirin, Brilinta, metoprolol, Crestor, Zetia.  2. Elevated BP reading: BP well controlled. Continue current antihypertensive regimen.    3. Hyperlipidemia: LDL was  50 in 04/2022.  Lipoprotein (a) was elevated and she was started on Zetia. Continue aspirin, Crestor, Zetia.   4. Prediabetes: A1c was 5.8 in May 2023. Monitored and managed per PCP.   5. Anemia/thrombocytopenia: CBC on 04/26/2022 showed hemoglobin of 11.0, platelet count of 124 K/uL. She denies bleeding. Will repeat CBC today.   6. Disposition: Follow-up in 4-6 months with Dr. Stanford Breed.       Lenna Sciara, NP 06/01/2022, 9:14 AM

## 2022-06-02 ENCOUNTER — Encounter: Payer: PPO | Admitting: *Deleted

## 2022-06-02 DIAGNOSIS — Z955 Presence of coronary angioplasty implant and graft: Secondary | ICD-10-CM | POA: Diagnosis not present

## 2022-06-02 LAB — CBC
Hematocrit: 39.9 % (ref 34.0–46.6)
Hemoglobin: 13.4 g/dL (ref 11.1–15.9)
MCH: 30.2 pg (ref 26.6–33.0)
MCHC: 33.6 g/dL (ref 31.5–35.7)
MCV: 90 fL (ref 79–97)
Platelets: 167 10*3/uL (ref 150–450)
RBC: 4.43 x10E6/uL (ref 3.77–5.28)
RDW: 13.4 % (ref 11.7–15.4)
WBC: 6.6 10*3/uL (ref 3.4–10.8)

## 2022-06-02 NOTE — Progress Notes (Signed)
Daily Session Note  Patient Details  Name: [name] MRN: [id number] Date of Birth: 03/25/1946 Referring Provider:   Flowsheet Row Cardiac Rehab from 05/23/2022 in [facility] Cardiac and Pulmonary Rehab  Referring Provider Dr. Brian Crenshaw       Encounter Date: 06/02/2022  Check In:  Session Check In - 06/02/22 1329       Check-In   Supervising physician immediately available to respond to emergencies See telemetry face sheet for immediately available ER MD    Location [facility]-Cardiac & Pulmonary Rehab    Staff Present Meredith Craven, RN BSN;Joseph [place], RCP,RRT,BSRT;Jessica Hawkins, MA, RCEP, CCRP, CCET    Virtual Visit No    Medication changes reported     No    Fall or balance concerns reported    No    Warm-up and Cool-down Performed on first and last piece of equipment    Resistance Training Performed Yes    VAD Patient? No    PAD/SET Patient? No      Pain Assessment   Currently in Pain? No/denies                Social History   Tobacco Use  Smoking Status Never  Smokeless Tobacco Never    Goals Met:  Independence with exercise equipment Exercise tolerated well No report of concerns or symptoms today Strength training completed today  Goals Unmet:  Not Applicable  Comments: Pt able to follow exercise prescription today without complaint.  Will continue to monitor for progression.    Dr. Mark Miller is Medical Director for [facility].  Dr. Fuad Aleskerov is Medical Director for [facility] Pulmonary Rehabilitation. 

## 2022-06-06 ENCOUNTER — Telehealth: Payer: Self-pay

## 2022-06-06 NOTE — Telephone Encounter (Signed)
Spoke with pt. Pt was notified of lab results and will f/u as planned.

## 2022-06-08 ENCOUNTER — Encounter: Payer: PPO | Admitting: *Deleted

## 2022-06-08 DIAGNOSIS — Z955 Presence of coronary angioplasty implant and graft: Secondary | ICD-10-CM

## 2022-06-08 NOTE — Progress Notes (Signed)
Daily Session Note  Patient Details  Name: Makaylyn Sinyard MRN: [id number] Date of Birth: [birth year] Referring Provider:   Flowsheet Row Cardiac Rehab from 05/23/2022 in [facility] Cardiac and Pulmonary Rehab  Referring Provider Dr. Kirk Ruths       Encounter Date: 06/08/2022  Check In:  Session Check In - 06/08/22 1409       Check-In   Supervising physician immediately available to respond to emergencies See telemetry face sheet for immediately available ER MD    Location [facility]-Cardiac & Pulmonary Rehab    Staff Present Renita Papa, RN BSN;Joseph [place], RCP,RRT,BSRT;Noah [place], [place], Exercise Physiologist    Virtual Visit No    Medication changes reported     No    Fall or balance concerns reported    No    Warm-up and Cool-down Performed on first and last piece of equipment    Resistance Training Performed Yes    VAD Patient? No    PAD/SET Patient? No      Pain Assessment   Currently in Pain? No/denies                Social History   Tobacco Use  Smoking Status Never  Smokeless Tobacco Never    Goals Met:  Independence with exercise equipment Exercise tolerated well No report of concerns or symptoms today Strength training completed today  Goals Unmet:  Not Applicable  Comments: Pt able to follow exercise prescription today without complaint.  Will continue to monitor for progression.    Dr. Emily Filbert is Medical Director for [place].  Dr. Ottie Glazier is Medical Director for [facility] Pulmonary Rehabilitation.

## 2022-06-09 ENCOUNTER — Encounter: Payer: PPO | Admitting: *Deleted

## 2022-06-09 DIAGNOSIS — Z955 Presence of coronary angioplasty implant and graft: Secondary | ICD-10-CM

## 2022-06-09 NOTE — Progress Notes (Signed)
Daily Session Note  Patient Details  Name: [name] MRN: [id number] Date of Birth: 10-17-1945 Referring Provider:   Flowsheet Row Cardiac Rehab from 05/23/2022 in [facility] Cardiac and Pulmonary Rehab  Referring Provider Dr. Kirk Ruths       Encounter Date: 06/09/2022  Check In:  Session Check In - 06/09/22 1342       Check-In   Supervising physician immediately available to respond to emergencies See telemetry face sheet for immediately available ER MD    Location [facility]-Cardiac & Pulmonary Rehab    Staff Present Renita Papa, RN BSN;Joseph [place], RCP,RRT,BSRT;Noah [place], [place], Exercise Physiologist    Virtual Visit No    Medication changes reported     No    Fall or balance concerns reported    No    Warm-up and Cool-down Performed on first and last piece of equipment    Resistance Training Performed Yes    VAD Patient? No    PAD/SET Patient? No      Pain Assessment   Currently in Pain? No/denies                Social History   Tobacco Use  Smoking Status Never  Smokeless Tobacco Never    Goals Met:  Independence with exercise equipment Exercise tolerated well No report of concerns or symptoms today Strength training completed today  Goals Unmet:  Not Applicable  Comments: Pt able to follow exercise prescription today without complaint.  Will continue to monitor for progression.    Dr. Emily Filbert is Medical Director for [place].  Dr. Ottie Glazier is Medical Director for [facility] Pulmonary Rehabilitation.

## 2022-06-15 ENCOUNTER — Encounter: Payer: PPO | Admitting: *Deleted

## 2022-06-15 DIAGNOSIS — Z955 Presence of coronary angioplasty implant and graft: Secondary | ICD-10-CM

## 2022-06-15 NOTE — Progress Notes (Signed)
Daily Session Note  Patient Details  Name: [name] MRN: [id number] Date of Birth: [birth year] Referring Provider:   Flowsheet Row Cardiac Rehab from 05/23/2022 in [facility] Cardiac and Pulmonary Rehab  Referring Provider Dr. Kirk Ruths       Encounter Date: 06/15/2022  Check In:  Session Check In - 06/15/22 1351       Check-In   Supervising physician immediately available to respond to emergencies See telemetry face sheet for immediately available ER MD    Location [facility]-Cardiac & Pulmonary Rehab    Staff Present Renita Papa, RN BSN;Joseph [place], RCP,RRT,BSRT;Laureen [place], [place], RRT, CPFT    Virtual Visit No    Medication changes reported     No    Fall or balance concerns reported    No    Warm-up and Cool-down Performed on first and last piece of equipment    Resistance Training Performed Yes    VAD Patient? No    PAD/SET Patient? No      Pain Assessment   Currently in Pain? No/denies                Social History   Tobacco Use  Smoking Status Never  Smokeless Tobacco Never    Goals Met:  Independence with exercise equipment Exercise tolerated well No report of concerns or symptoms today Strength training completed today  Goals Unmet:  Not Applicable  Comments: Pt able to follow exercise prescription today without complaint.  Will continue to monitor for progression.    Dr. Emily Filbert is Medical Director for [place].  Dr. Ottie Glazier is Medical Director for [facility] Pulmonary Rehabilitation.

## 2022-06-16 ENCOUNTER — Encounter: Payer: PPO | Admitting: *Deleted

## 2022-06-16 DIAGNOSIS — Z955 Presence of coronary angioplasty implant and graft: Secondary | ICD-10-CM | POA: Diagnosis not present

## 2022-06-16 NOTE — Progress Notes (Signed)
Daily Session Note  Patient Details  Name: [name] MRN: [id number] Date of Birth: 12/04/1945 Referring Provider:   Flowsheet Row Cardiac Rehab from 05/23/2022 in [facility] Cardiac and Pulmonary Rehab  Referring Provider Dr. Kirk Ruths       Encounter Date: 06/16/2022  Check In:  Session Check In - 06/16/22 1413       Check-In   Supervising physician immediately available to respond to emergencies See telemetry face sheet for immediately available ER MD    Location [facility]-Card[facility] & Pulmonary Rehab    Staff Present Renita Papa, RN BSN;Joseph [place], RCP,RRT,BSRT;Jessica [place], [place], RCEP, CCRP, CCET    Virtual Visit No    Medication changes reported     No    Fall or balance concerns reported    No    Warm-up and Cool-down Performed on first and last piece of equipment    Resistance Training Performed Yes    VAD Patient? No    PAD/SET Patient? No      Pain Assessment   Currently in Pain? No/denies                Social History   Tobacco Use  Smoking Status Never  Smokeless Tobacco Never    Goals Met:  Independence with exercise equipment Exercise tolerated well No report of concerns or symptoms today Strength training completed today  Goals Unmet:  Not Applicable  Comments: Pt able to follow exercise prescription today without complaint.  Will continue to monitor for progression.    Dr. Emily Filbert is Medical Director for [place].  Dr. Ottie Glazier is Medical Director for [facility] Pulmonary Rehabilitation.

## 2022-06-22 ENCOUNTER — Encounter: Payer: PPO | Admitting: *Deleted

## 2022-06-22 DIAGNOSIS — Z955 Presence of coronary angioplasty implant and graft: Secondary | ICD-10-CM | POA: Diagnosis not present

## 2022-06-22 NOTE — Progress Notes (Signed)
Daily Session Note  Patient Details  Name: [name] MRN: [id number] Date of Birth: [birth year] Referring Provider:   Flowsheet Row Cardiac Rehab from 05/23/2022 in [facility] Cardiac and Pulmonary Rehab  Referring Provider Dr. Kirk Ruths       Encounter Date: 06/22/2022  Check In:  Session Check In - 06/22/22 1410       Check-In   Supervising physician immediately available to respond to emergencies See telemetry face sheet for immediately available ER MD    Location [facility]-Cardiac & Pulmonary Rehab    Staff Present Justin Mend, Ernestina Patches, RN, [place]hel[place]ia Blase, MS, ASCM CEP, Exercise Physiologist    Virtual Visit No    Medication changes reported     No    Fall or balance concerns reported    No    Warm-up and Cool-down Performed on first and last piece of equipment    Resistance Training Performed Yes    VAD Patient? No    PAD/SET Patient? No      Pain Assessment   Currently in Pain? No/denies                Social History   Tobacco Use  Smoking Status Never  Smokeless Tobacco Never    Goals Met:  Independence with exercise equipment Exercise tolerated well No report of concerns or symptoms today Strength training completed today  Goals Unmet:  Not Applicable  Comments: Pt able to follow exercise prescription today without complaint.  Will continue to monitor for progression.    Dr. Emily Filbert is Medical Director for [place].  Dr. Ottie Glazier is Medical Director for [facility] Pulmonary Rehabilitation.

## 2022-06-23 ENCOUNTER — Encounter: Payer: PPO | Admitting: *Deleted

## 2022-06-23 DIAGNOSIS — Z955 Presence of coronary angioplasty implant and graft: Secondary | ICD-10-CM

## 2022-06-23 NOTE — Progress Notes (Signed)
Daily Session Note  Patient Details  Name: [name] MRN: [id number] Date of Birth: 1945-12-07 Referring Provider:   Flowsheet Row Cardiac Rehab from 05/23/2022 in [facility] Cardiac and Pulmonary Rehab  Referring Provider Dr. Kirk Ruths       Encounter Date: 06/23/2022  Check In:  Session Check In - 06/23/22 1343       Check-In   Supervising physician immediately available to respond to emergencies See telemetry face sheet for immediately available ER MD    Location [facility]-Cardiac & Pulmonary Rehab    Staff Present Justin Mend, Lorre Nick, MA, RCEP, CCRP, CCET;Benard [place] Sherryll Burger, RN BSN    Virtual Visit No    Medication changes reported     No    Fall or balance concerns reported    No    Warm-up and Cool-down Performed on first and last piece of equipment    Resistance Training Performed Yes    VAD Patient? No    PAD/SET Patient? No      Pain Assessment   Currently in Pain? No/denies                Social History   Tobacco Use  Smoking Status Never  Smokeless Tobacco Never    Goals Met:  Independence with exercise equipment Exercise tolerated well No report of concerns or symptoms today Strength training completed today  Goals Unmet:  Not Applicable  Comments: Pt able to follow exercise prescription today without complaint.  Will continue to monitor for progression.    Dr. Emily Filbert is Medical Director for [place].  Dr. Ottie Glazier is Medical Director for [facility] Pulmonary Rehabilitation.

## 2022-06-29 ENCOUNTER — Encounter: Payer: Self-pay | Admitting: *Deleted

## 2022-06-29 ENCOUNTER — Encounter: Payer: PPO | Attending: Cardiology | Admitting: *Deleted

## 2022-06-29 DIAGNOSIS — Z955 Presence of coronary angioplasty implant and graft: Secondary | ICD-10-CM | POA: Insufficient documentation

## 2022-06-29 NOTE — Progress Notes (Signed)
Cardiac Individual Treatment Plan  Patient Details  Name: [name] MRN: [id number] Date of Birth: 1946-08-06 Referring Provider:   Flowsheet Row Cardiac Rehab from 05/23/2022 in [facility] Cardiac and Pulmonary Rehab  Referring Provider Dr. Kirk Ruths       Initial Encounter Date:  Flowsheet Row Cardiac Rehab from 05/23/2022 in [facility] Cardiac and Pulmonary Rehab  Date 05/23/22       Visit Diagnosis: Status post coronary artery stent placement  Patient's Home Medications on Admission:  Current Outpatient Medications:    aspirin 81 MG tablet, Take 81 mg by mouth daily., Disp: , Rfl:    ezetimibe (ZETIA) 10 MG tablet, Take 1 tablet (10 mg total) by mouth daily., Disp: 90 tablet, Rfl: 3   levothyroxine (SYNTHROID) 50 MCG tablet, TAKE 1 TABLET BY MOUTH EVERY DAY, Disp: 90 tablet, Rfl: 3   metoprolol succinate (TOPROL XL) 25 MG 24 hr tablet, Take 1 tablet (25 mg total) by mouth daily., Disp: 90 tablet, Rfl: 3   omeprazole (PRILOSEC) 20 MG capsule, Take 1 capsule (20 mg total) by mouth daily as needed. (Patient taking differently: Take 20 mg by mouth daily.), Disp: 90 capsule, Rfl: 3   rosuvastatin (CRESTOR) 40 MG tablet, Take 1 tablet (40 mg total) by mouth daily., Disp: 90 tablet, Rfl: 3   ticagrelor (BRILINTA) 90 MG TABS tablet, Take 1 tablet (90 mg total) by mouth 2 (two) times daily., Disp: 60 tablet, Rfl: 11  Past Medical History: 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 ( Dr Tonia Brooms)   Subclinical hypothyroidism 05/17/2011   Venous insufficiency of right leg 06/23/2017   Follows with vascular   Weakness     Tobacco Use: Social History   Tobacco Use   Smoking Status Never  Smokeless Tobacco Never    Labs: Review Flowsheet  More data exists      Latest Ref Rng & Units 01/01/2020 10/21/2020 04/16/2021 01/31/2022 05/25/2022  Labs for ITP Cardiac and Pulmonary Rehab  Cholestrol 100 - 199 mg/dL - 208  221  219  120   LDL (calc) 0 - 99 mg/dL - 125  135  136  50   HDL-C >39 mg/dL - 64.60  65.00  64.00  56   Trlycerides 0 - 149 mg/dL - 93.0  102.0  92.0  68   Hemoglobin A1c 4.6 - 6.5 % 5.6  5.8  5.8  5.8  -     Exercise Target Goals: Exercise Program Goal: Individual exercise prescription set using results from initial 6 min walk test and THRR while considering  patient's activity barriers and safety.   Exercise Prescription Goal: Initial exercise prescription builds to 30-45 minutes a day of aerobic activity, 2-3 days per week.  Home exercise guidelines will be given to patient during program as part of exercise prescription that the participant will acknowledge.   Education: Aerobic Exercise: - Group verbal and visual presentation on the components of exercise prescription. Introduces F.I.T.T principle from ACSM for exercise prescriptions.  Reviews F.I.T.T. principles of aerobic exercise including progression. Written material given at graduation. Flowsheet Row Cardiac Rehab from 06/22/2022 in [facility]  Cardiac and Pulmonary Rehab  Date 06/01/22  Educator [facility]  Instruction Review Code 1- Verbalizes Understanding       Education: Resistance Exercise: - Group verbal and visual presentation on the components of exercise prescription. Introduces F.I.T.T principle from ACSM for exercise prescriptions  Reviews F.I.T.T. principles of resistance exercise including progression. Written material given at graduation. Flowsheet Row Cardiac Rehab from 06/22/2022 in [facility] Cardiac and Pulmonary Rehab  Date 06/08/22  Educator NT  Instruction Review Code 1- Verbalizes Understanding        Education: Exercise & Equipment Safety: - Individual verbal  instruction and demonstration of equipment use and safety with use of the equipment. Flowsheet Row Cardiac Rehab from 06/22/2022 in [facility] Cardiac and Pulmonary Rehab  Date 05/23/22  Educator [facility]  Instruction Review Code 1- Verbalizes Understanding       Education: Exercise Physiology & General Exercise Guidelines: - Group verbal and written instruction with models to review the exercise physiology of the cardiovascular system and associated critical values. Provides general exercise guidelines with specific guidelines to those with heart or lung disease.  Flowsheet Row Cardiac Rehab from 06/22/2022 in [facility] Cardiac and Pulmonary Rehab  Education need identified 05/23/22  Date 05/25/22  Educator [place]  Instruction Review Code 1- Verbalizes Understanding       Education: Flexibility, Balance, Mind/Body Relaxation: - Group verbal and visual presentation with interactive activity on the components of exercise prescription. Introduces F.I.T.T principle from ACSM for exercise prescriptions. Reviews F.I.T.T. principles of flexibility and balance exercise training including progression. Also discusses the mind body connection.  Reviews various relaxation techniques to help reduce and manage stress (i.e. Deep breathing, progressive muscle relaxation, and visualization). Balance handout provided to take home. Written material given at graduation. Flowsheet Row Cardiac Rehab from 06/22/2022 in [facility] Cardiac and Pulmonary Rehab  Date 06/08/22  Educator NT  Instruction Review Code 1- Verbalizes Understanding       Activity Barriers & Risk Stratification:  Activity Barriers & Cardiac Risk Stratification - 05/13/22 1413       Activity Barriers & Cardiac Risk Stratification   Activity Barriers None    Cardiac Risk Stratification Moderate             6 Minute Walk:  6 Minute Walk     Row Name 05/23/22 1509         6 Minute Walk   Phase Initial     Distance 1340 feet     Walk Time 6 minutes      # of Rest Breaks 0     MPH 2.54     METS 2.31     RPE 10     Perceived Dyspnea  0     VO2 Peak 8.08     Symptoms No     Resting HR 70 bpm     Resting BP 122/80     Resting Oxygen Saturation  98 %     Exercise Oxygen Saturation  during 6 min walk 97 %     Max Ex. HR 98 bpm     Max Ex. BP 138/72     2 Minute Post BP 128/72              Oxygen Initial Assessment:   Oxygen Re-Evaluation:   Oxygen Discharge (Final Oxygen Re-Evaluation):   Initial Exercise Prescription:  Initial Exercise Prescription - 05/23/22 1500       Date of Initial Exercise RX and Referring Provider   Date 05/23/22  Referring Provider Dr. Kirk Ruths      Oxygen   Maintain Oxygen Saturation 88% or higher      NuStep   Level 2    SPM 80    Minutes 15    METs 2.31      REL-XR   Level 1    Speed 50    Minutes 15    METs 2.31      Track   Laps 35    Minutes 15    METs 2.9      Prescription Details   Frequency (times per week) 2    Duration Progress to 30 minutes of continuous aerobic without signs/symptoms of physical distress      Intensity   THRR 40-80% of Max Heartrate 100-130    Ratings of Perceived Exertion 11-13    Perceived Dyspnea 0-4      Progression   Progression Continue to progress workloads to maintain intensity without signs/symptoms of physical distress.      Resistance Training   Training Prescription Yes    Weight 3    Reps 10-15             Perform Capillary Blood Glucose checks as needed.  Exercise Prescription Changes:   Exercise Prescription Changes     Row Name 05/23/22 1500 06/01/22 1600 06/15/22 1500 06/28/22 1500       Response to Exercise   Blood Pressure (Admit) 122/80 116/68 124/72 126/64    Blood Pressure (Exercise) 138/72 148/66 142/62 144/68    Blood Pressure (Exit) 128/72 122/66 128/68 124/60    Heart Rate (Admit) 70 bpm 75 bpm 66 bpm 77 bpm    Heart Rate (Exercise) 109 bpm 109 bpm 102 bpm 103 bpm    Heart Rate (Exit)  72 bpm 80 bpm 78 bpm 77 bpm    Oxygen Saturation (Admit) 98 % -- -- --    Oxygen Saturation (Exercise) 97 % -- -- --    Oxygen Saturation (Exit) 98 % -- -- --    Rating of Perceived Exertion (Exercise) '10 11 14 11    ' Perceived Dyspnea (Exercise) 0 -- -- --    Symptoms none none none none    Comments 6 MWT results First full day of exercise -- --    Duration -- Continue with 30 min of aerobic exercise without signs/symptoms of physical distress. Continue with 30 min of aerobic exercise without signs/symptoms of physical distress. Continue with 30 min of aerobic exercise without signs/symptoms of physical distress.    Intensity -- THRR unchanged THRR unchanged THRR unchanged      Progression   Progression -- Continue to progress workloads to maintain intensity without signs/symptoms of physical distress. Continue to progress workloads to maintain intensity without signs/symptoms of physical distress. Continue to progress workloads to maintain intensity without signs/symptoms of physical distress.    Average METs -- 2.3 3.3 2.91      Resistance Training   Training Prescription -- Yes Yes Yes    Weight -- 3 lb 3 lb 2 lb    Reps -- 10-15 10-15 10-15      Interval Training   Interval Training -- -- No No      NuStep   Level -- 3 3 --    Minutes -- 15 15 --    METs -- 1.4 -- --      REL-XR   Level -- '1 3 3    ' Minutes -- '15 15 15    ' METs --  2.1 4.8 3      Track   Laps -- 34 40 25    Minutes -- '15 15 15    ' METs -- 2.85 3.18 2.36      Oxygen   Maintain Oxygen Saturation -- -- 88% or higher 88% or higher             Exercise Comments:   Exercise Goals and Review:   Exercise Goals     Row Name 05/23/22 1513             Exercise Goals   Increase Physical Activity Yes       Intervention Provide advice, education, support and counseling about physical activity/exercise needs.;Develop an individualized exercise prescription for aerobic and resistive training based on  initial evaluation findings, risk stratification, comorbidities and participant's personal goals.       Expected Outcomes Short Term: Attend rehab on a regular basis to increase amount of physical activity.;Long Term: Add in home exercise to make exercise part of routine and to increase amount of physical activity.;Long Term: Exercising regularly at least 3-5 days a week.       Increase Strength and Stamina Yes       Intervention Provide advice, education, support and counseling about physical activity/exercise needs.;Develop an individualized exercise prescription for aerobic and resistive training based on initial evaluation findings, risk stratification, comorbidities and participant's personal goals.       Expected Outcomes Short Term: Increase workloads from initial exercise prescription for resistance, speed, and METs.;Short Term: Perform resistance training exercises routinely during rehab and add in resistance training at home;Long Term: Improve cardiorespiratory fitness, muscular endurance and strength as measured by increased METs and functional capacity (6MWT)       Able to understand and use rate of perceived exertion (RPE) scale Yes       Intervention Provide education and explanation on how to use RPE scale       Expected Outcomes Short Term: Able to use RPE daily in rehab to express subjective intensity level;Long Term:  Able to use RPE to guide intensity level when exercising independently       Able to understand and use Dyspnea scale Yes       Intervention Provide education and explanation on how to use Dyspnea scale       Expected Outcomes Short Term: Able to use Dyspnea scale daily in rehab to express subjective sense of shortness of breath during exertion;Long Term: Able to use Dyspnea scale to guide intensity level when exercising independently       Knowledge and understanding of Target Heart Rate Range (THRR) Yes       Intervention Provide education and explanation of THRR  including how the numbers were predicted and where they are located for reference       Expected Outcomes Short Term: Able to state/look up THRR;Short Term: Able to use daily as guideline for intensity in rehab;Long Term: Able to use THRR to govern intensity when exercising independently       Able to check pulse independently Yes       Intervention Provide education and demonstration on how to check pulse in carotid and radial arteries.;Review the importance of being able to check your own pulse for safety during independent exercise       Expected Outcomes Short Term: Able to explain why pulse checking is important during independent exercise;Long Term: Able to check pulse independently and accurately       Understanding of  Exercise Prescription Yes       Intervention Provide education, explanation, and written materials on patient's individual exercise prescription       Expected Outcomes Short Term: Able to explain program exercise prescription;Long Term: Able to explain home exercise prescription to exercise independently                Exercise Goals Re-Evaluation :  Exercise Goals Re-Evaluation     Row Name 05/25/22 1337 06/01/22 1627 06/15/22 1533 06/28/22 1522       Exercise Goal Re-Evaluation   Exercise Goals Review Increase Physical Activity;Able to understand and use rate of perceived exertion (RPE) scale;Knowledge and understanding of Target Heart Rate Range (THRR);Understanding of Exercise Prescription;Increase Strength and Stamina;Able to check pulse independently Increase Physical Activity;Understanding of Exercise Prescription;Increase Strength and Stamina Increase Physical Activity;Understanding of Exercise Prescription;Increase Strength and Stamina --    Comments Reviewed RPE and dyspnea scales, THR and program prescription with pt today.  Pt voiced understanding and was given a copy of goals to take home. Bedelia Pong is off to a good start in rehab. She had an average overall  MET level of 2.3 METs. She also was able to walk 34 laps on the track. She tolerated level 1 on the XR and level 3 on the T4 as well. We will continue to monitor her progress in the program. Semone Orlov continues to do well in rehab. She increased her overall average MET level to 3.3 METs. She also was able to walk up to 40 laps on the track. She improved to level 3 on the XR as well. We will continue to monitor her progress in the program. Whitni Pasquini continues to do well in rehab. She did see a decrease in her METs to 2.91 METs. She did tolerate the XR machine at level 3. Her laps on the track did see a little bit of a decrease also. We will continue to monitor her progress in the program.    Expected Outcomes Short: Use RPE daily to regulate intensity. Long: Follow program prescription in THR. Short: Continue to increase workloads. Long: Continue to increase strength and stamina. Short: Continue to increase laps on the track. Long: Continue to increase strength and stamina. Short: Continue to increase workloads and laps on the track. Long: Continue to increase strength and stamina.             Discharge Exercise Prescription (Final Exercise Prescription Changes):  Exercise Prescription Changes - 06/28/22 1500       Response to Exercise   Blood Pressure (Admit) 126/64    Blood Pressure (Exercise) 144/68    Blood Pressure (Exit) 124/60    Heart Rate (Admit) 77 bpm    Heart Rate (Exercise) 103 bpm    Heart Rate (Exit) 77 bpm    Rating of Perceived Exertion (Exercise) 11    Symptoms none    Duration Continue with 30 min of aerobic exercise without signs/symptoms of physical distress.    Intensity THRR unchanged      Progression   Progression Continue to progress workloads to maintain intensity without signs/symptoms of physical distress.    Average METs 2.91      Resistance Training   Training Prescription Yes    Weight 2 lb    Reps 10-15      Interval Training   Interval Training No       REL-XR   Level 3    Minutes 15    METs 3  Track   Laps 25    Minutes 15    METs 2.36      Oxygen   Maintain Oxygen Saturation 88% or higher             Nutrition:  Target Goals: Understanding of nutrition guidelines, daily intake of sodium <154m, cholesterol <2033m calories 30% from fat and 7% or less from saturated fats, daily to have 5 or more servings of fruits and vegetables.  Education: All About Nutrition: -Group instruction provided by verbal, written material, interactive activities, discussions, models, and posters to present general guidelines for heart healthy nutrition including fat, fiber, MyPlate, the role of sodium in heart healthy nutrition, utilization of the nutrition label, and utilization of this knowledge for meal planning. Follow up email sent as well. Written material given at graduation. [facility]  Education need identified 05/23/22  Date 06/22/22  Educ[place] Code 1- Verbalizes Understanding       Biometrics:  Pre Biometrics - 05/23/22 1514       Pre Biometrics   Height 5' 7.75" (1.721 m)    Weight 245 lb 3.2 oz (111.2 kg)    Waist Circumference 42.25 inches    Hip Circumference 55 inches    Waist to Hip Ratio 0.77 %    BMI (Calculated) 37.55    Single Leg Stand 4.42 seconds              Nutrition Therapy Plan and Nutrition Goals:  Nutrition Therapy & Goals - 06/15/22 1540       Nutrition Therapy   RD appointment deferred Yes   JeChidinmaeports that she will likely speak with RD in the future, but would like to defer at this time.     Personal Nutrition Goals   Nutrition Goal JeDelayneeeports that she will likely speak with RD in the future, but would like to defer at this time.             Nutrition Assessments:  MEDIFICTS Score Key: ?70 Need to make dietary changes  40-70 Heart Healthy Diet ? 40 Therapeutic Level  Cholesterol Diet  [facility]  Picture Your Plate Total Score on Admission 64      Picture Your Plate Scores: <4<09nhealthy dietary pattern with much room for improvement. 41-50 Dietary pattern unlikely to meet recommendations for good health and room for improvement. 51-60 More healthful dietary pattern, with some room for improvement.  >60 Healthy dietary pattern, although there may be some specific behaviors that could be improved.    Nutrition Goals Re-Evaluation:   Nutrition Goals Discharge (Final Nutrition Goals Re-Evaluation):   Psychosocial: Target Goals: Acknowledge presence or absence of significant depression and/or stress, maximize coping skills, provide positive support system. Participant is able to verbalize types and ability to use techniques and skills needed for reducing stress and depression.   Education: Stress, Anxiety, and Depression - Group verbal and visual presentation to define topics covered.  Reviews how body is impacted by stress, anxiety, and depression.  Also discusses healthy ways to reduce stress and to treat/manage anxiety and depression.  Written material given at graduation.   Education: Sleep Hygiene -Provides group verbal and written instruction about how sleep can affect your health.  Define sleep hygiene, discuss sleep cycles and impact of sleep habits. Review good sleep hygiene tips.    Initial Review & Psychosocial  Screening:  Initial Psych Review & Screening - 05/13/22 1420       Initial Review   Current issues with Current Stress Concerns      Family Dynamics   Good Support System? Yes      Barriers   Psychosocial barriers to participate in program There are no identifiable barriers or psychosocial needs.;The patient should benefit from training in stress management and relaxation.      Screening Interventions   Interventions Encouraged to exercise;Provide  feedback about the scores to participant;To provide support and resources with identified psychosocial needs    Expected Outcomes Short Term goal: Utilizing psychosocial counselor, staff and physician to assist with identification of specific Stressors or current issues interfering with healing process. Setting desired goal for each stressor or current issue identified.;Long Term Goal: Stressors or current issues are controlled or eliminated.;Short Term goal: Identification and review with participant of any Quality of Life or Depression concerns found by scoring the questionnaire.;Long Term goal: The participant improves quality of Life and PHQ9 Scores as seen by post scores and/or verbalization of changes             Quality of Life Scores:   Quality of Life - 05/23/22 1517       Quality of Life   Select Quality of Life      Quality of Life Scores   Health/Function Pre 25.71 %    Socioeconomic Pre 27 %    Psych/Spiritual Pre 26.57 %    Family Pre 24 %    GLOBAL Pre 26.14 %            Scores of 19 and below usually indicate a poorer quality of life in these areas.  A difference of  2-3 points is a clinically meaningful difference.  A difference of 2-3 points in the total score of the Quality of Life Index has been associated with significant improvement in overall quality of life, self-image, physical symptoms, and general health in studies assessing change in quality of life.  PHQ-9: Review Flowsheet  More data exists      05/23/2022 04/12/2022 03/23/2021 04/15/2020 01/28/2020  Depression screen PHQ 2/9  Decreased Interest 1 0 0 0 0  Down, Depressed, Hopeless 1 0 0 0 0  PHQ - 2 Score 2 0 0 0 0  Altered sleeping 0 - - 0 -  Tired, decreased energy 1 - - 0 -  Change in appetite 0 - - 0 -  Feeling bad or failure about yourself  0 - - 0 -  Trouble concentrating 1 - - 0 -  Moving slowly or fidgety/restless 0 - - 0 -  Suicidal thoughts 0 - - 0 -  PHQ-9 Score 4 - - 0 -  Difficult  doing work/chores Not difficult at all - - Not difficult at all -   Interpretation of Total Score  Total Score Depression Severity:  1-4 = Minimal depression, 5-9 = Mild depression, 10-14 = Moderate depression, 15-19 = Moderately severe depression, 20-27 = Severe depression   Psychosocial Evaluation and Intervention:  Psychosocial Evaluation - 05/13/22 1436       Psychosocial Evaluation & Interventions   Interventions Encouraged to exercise with the program and follow exercise prescription;Stress management education;Relaxation education    Comments Melonie Germani has only been on thyroid medication until her recent stent which led to being put on blood pressure, cholesteral and blood thinner medication. She really wants to get off as much medication as she can as  she doesn't like the side effects. She has a history of increased stress. She states she realized something was off in the spring when she was putting out mulch and kept getting short of breath which prompted her to seek evaluation from her MD. She feels thankful it was just a stent and not bypass like her husband (who has passed now) needed years ago. She is very interested in the education component, especially nutrition.    Expected Outcomes Short: attend cardiac rehab for education and exercise. Long: develop and maintian positive self care habits.    Continue Psychosocial Services  Follow up required by staff             Psychosocial Re-Evaluation:  Psychosocial Re-Evaluation     Braymer Name 06/23/22 1411             Psychosocial Re-Evaluation   Current issues with None Identified       Comments Patient reports no issues with their current mental states, sleep, stress, depression or anxiety. Will follow up with patient in a few weeks for any changes.       Expected Outcomes Short: Continue to exercise regularly to support mental health and notify staff of any changes. Long: maintain mental health and well being through  teaching of rehab or prescribed medications independently.       Interventions Encouraged to attend Cardiac Rehabilitation for the exercise       Continue Psychosocial Services  Follow up required by staff                Psychosocial Discharge (Final Psychosocial Re-Evaluation):  Psychosocial Re-Evaluation - 06/23/22 1411       Psychosocial Re-Evaluation   Current issues with None Identified    Comments Patient reports no issues with their current mental states, sleep, stress, depression or anxiety. Will follow up with patient in a few weeks for any changes.    Expected Outcomes Short: Continue to exercise regularly to support mental health and notify staff of any changes. Long: maintain mental health and well being through teaching of rehab or prescribed medications independently.    Interventions Encouraged to attend Cardiac Rehabilitation for the exercise    Continue Psychosocial Services  Follow up required by staff             Vocational Rehabilitation: Provide vocational rehab assistance to qualifying candidates.   Vocational Rehab Evaluation & Intervention:  Vocational Rehab - 05/13/22 1420       Initial Vocational Rehab Evaluation & Intervention   Assessment shows need for Vocational Rehabilitation No             Education: Education Goals: Education classes will be provided on a variety of topics geared toward better understanding of heart health and risk factor modification. Participant will state understanding/return demonstration of topics presented as noted by education test scores.  Learning Barriers/Preferences:  Learning Barriers/Preferences - 05/13/22 1420       Learning Barriers/Preferences   Learning Barriers None    Learning Preferences Individual Instruction             General Cardiac Education Topics:  AED/CPR: - Group verbal and written instruction with the use of models to demonstrate the basic use of the AED with the basic ABC's  of resuscitation.   Anatomy and Cardiac Procedures: - Group verbal and visual presentation and models provide information about basic cardiac anatomy and function. Reviews the testing methods done to diagnose heart disease and the outcomes of the  test results. Describes the treatment choices: Medical Management, Angioplasty, or Coronary Bypass Surgery for treating various heart conditions including Myocardial Infarction, Angina, Valve Disease, and Cardiac Arrhythmias.  Written material given at graduation. Flowsheet Row Cardiac Rehab from 06/22/2022 in [facility] Cardiac and Pulmonary Rehab  Education need identified 05/23/22  Date 06/15/22  Educator SB  Instruction Review Code 1- Verbalizes Understanding       Medication Safety: - Group verbal and visual instruction to review commonly prescribed medications for heart and lung disease. Reviews the medication, class of the drug, and side effects. Includes the steps to properly store meds and maintain the prescription regimen.  Written material given at graduation.   Intimacy: - Group verbal instruction through game format to discuss how heart and lung disease can affect sexual intimacy. Written material given at graduation.. Flowsheet Row Cardiac Rehab from 06/22/2022 in [facility] Cardiac and Pulmonary Rehab  Education need identified 05/23/22  Date 06/01/22  Educator [facility]  Instruction Review Code 1- Verbalizes Understanding       Know Your Numbers and Heart Failure: - Group verbal and visual instruction to discuss disease risk factors for cardiac and pulmonary disease and treatment options.  Reviews associated critical values for Overweight/Obesity, Hypertension, Cholesterol, and Diabetes.  Discusses basics of heart failure: signs/symptoms and treatments.  Introduces Heart Failure Zone chart for action plan for heart failure.  Written material given at graduation. Flowsheet Row Cardiac Rehab from 06/22/2022 in [facility] Cardiac and Pulmonary Rehab   Education need identified 05/23/22       Infection Prevention: - Provides verbal and written material to individual with discussion of infection control including proper hand washing and proper equipment cleaning during exercise session. Flowsheet Row Cardiac Rehab from 06/22/2022 in [facility] Cardiac and Pulmonary Rehab  Date 05/23/22  Educator [facility]  Instruction Review Code 1- Verbalizes Understanding       Falls Prevention: - Provides verbal and written material to individual with discussion of falls prevention and safety. Flowsheet Row Cardiac Rehab from 06/22/2022 in [facility] Cardiac and Pulmonary Rehab  Date 05/23/22  Educator [facility]  Instruction Review Code 1- Verbalizes Understanding       Other: -Provides group and verbal instruction on various topics (see comments)   Knowledge Questionnaire Score:  Knowledge Questionnaire Score - 05/23/22 1518       Knowledge Questionnaire Score   Pre Score 19/26             Core Components/Risk Factors/Patient Goals at Admission:  Personal Goals and Risk Factors at Admission - 05/23/22 1519       Core Components/Risk Factors/Patient Goals on Admission    Weight Management Yes    Intervention Weight Management: Develop a combined nutrition and exercise program designed to reach desired caloric intake, while maintaining appropriate intake of nutrient and fiber, sodium and fats, and appropriate energy expenditure required for the weight goal.;Weight Management: Provide education and appropriate resources to help participant work on and attain dietary goals.;Weight Management/Obesity: Establish reasonable short term and long term weight goals.;Obesity: Provide education and appropriate resources to help participant work on and attain dietary goals.    Admit Weight 245 lb 3.2 oz (111.2 kg)    Goal Weight: Short Term 240 lb (108.9 kg)    Goal Weight: Long Term 220 lb (99.8 kg)    Expected Outcomes Short Term: Continue to assess and modify  interventions until short term weight is achieved;Long Term: Adherence to nutrition and physical activity/exercise program aimed toward attainment of established weight goal;Weight Loss:  Understanding of general recommendations for a balanced deficit meal plan, which promotes 1-2 lb weight loss per week and includes a negative energy balance of [phone] kcal/d;Understanding recommendations for meals to include 15-35% energy as protein, 25-35% energy from fat, 35-60% energy from carbohydrates, less than 24m of dietary cholesterol, 20-35 gm of total fiber daily;Understanding of distribution of calorie intake throughout the day with the consumption of 4-5 meals/snacks    Lipids Yes    Intervention Provide education and support for participant on nutrition & aerobic/resistive exercise along with prescribed medications to achieve LDL <745m HDL >4035m   Expected Outcomes Short Term: Participant states understanding of desired cholesterol values and is compliant with medications prescribed. Participant is following exercise prescription and nutrition guidelines.;Long Term: Cholesterol controlled with medications as prescribed, with individualized exercise RX and with personalized nutrition plan. Value goals: LDL < 39m69mDL > 40 mg.             Education:Diabetes - Individual verbal and written instruction to review signs/symptoms of diabetes, desired ranges of glucose level fasting, after meals and with exercise. Acknowledge that pre and post exercise glucose checks will be done for 3 sessions at entry of program.   Core Components/Risk Factors/Patient Goals Review:   Goals and Risk Factor Review     Row Name 06/23/22 1408             Core Components/Risk Factors/Patient Goals Review   Personal Goals Review Weight Management/Obesity       Review JerrLeenahts to lose more weight. She does not have alot of energy and is not able to do what she wants for exercise. She is trying to get more  focused on her diet and is trying to eat healthier.       Expected Outcomes Short: lose 5 pounds in a couple weeks. Long: reach her weight goal.                Core Components/Risk Factors/Patient Goals at Discharge (Final Review):   Goals and Risk Factor Review - 06/23/22 1408       Core Components/Risk Factors/Patient Goals Review   Personal Goals Review Weight Management/Obesity    Review JerrLaurets to lose more weight. She does not have alot of energy and is not able to do what she wants for exercise. She is trying to get more focused on her diet and is trying to eat healthier.    Expected Outcomes Short: lose 5 pounds in a couple weeks. Long: reach her weight goal.             ITP Comments:  ITP Comments     Row Name 05/13/22 1412 05/23/22 1508 05/25/22 1337 06/01/22 0753 06/29/22 0701   ITP Comments Initial telephone orientation completed. Diagnosis can be found in [facility]. EP orientation scheduled for Monday 8/28 at 1:30pm. Completed 6MWT and gym orientation. Initial ITP created and sent for review to Dr. MarkEmily Filbertdical Director. First full day of exercise!  Patient was oriented to gym and equipment including functions, settings, policies, and procedures.  Patient's individual exercise prescription and treatment plan were reviewed.  All starting workloads were established based on the results of the 6 minute walk test done at initial orientation visit.  The plan for exercise progression was also introduced and progression will be customized based on patient's performance and goals.' 30 Day review completed. Medical Director ITP review done, changes made as directed, and signed approval by Medical Director.   NEW  30 Day review completed. Medical Director ITP review done, changes made as directed, and signed approval by Medical Director.            Comments:

## 2022-06-29 NOTE — Progress Notes (Unsigned)
    Subjective:    Patient ID: [name], female    DOB: [birth year], 76 y.o.   MRN: [id number]      HPI [name] is here for No chief complaint on file.  Has h/o kidney stones.    Severe back pain, ? Kidney stone -     Medications and allergies reviewed with patient and updated if appropriate.  Current Outpatient Medications on File Prior to Visit  Medication Sig Dispense Refill   aspirin 81 MG tablet Take 81 mg by mouth daily.     ezetimibe (ZETIA) 10 MG tablet Take 1 tablet (10 mg total) by mouth daily. 90 tablet 3   levothyroxine (SYNTHROID) 50 MCG tablet TAKE 1 TABLET BY MOUTH EVERY DAY 90 tablet 3   metoprolol succinate (TOPROL XL) 25 MG 24 hr tablet Take 1 tablet (25 mg total) by mouth daily. 90 tablet 3   omeprazole (PRILOSEC) 20 MG capsule Take 1 capsule (20 mg total) by mouth daily as needed. (Patient taking differently: Take 20 mg by mouth daily.) 90 capsule 3   rosuvastatin (CRESTOR) 40 MG tablet Take 1 tablet (40 mg total) by mouth daily. 90 tablet 3   ticagrelor (BRILINTA) 90 MG TABS tablet Take 1 tablet (90 mg total) by mouth 2 (two) times daily. 60 tablet 11   No current facility-administered medications on file prior to visit.    Review of Systems     Objective:  There were no vitals filed for this visit. BP Readings from Last 3 Encounters:  06/01/22 128/82  05/03/22 (!) 140/80  04/26/22 (!) 145/61   Wt Readings from Last 3 Encounters:  06/01/22 244 lb 9.6 oz (110.9 kg)  05/23/22 245 lb 3.2 oz (111.2 kg)  05/03/22 249 lb (112.9 kg)   There is no height or weight on file to calculate BMI.    Physical Exam         Assessment & Plan:    See Problem List for Assessment and Plan of chronic medical problems.

## 2022-06-29 NOTE — Progress Notes (Signed)
Daily Session Note  Patient Details  Name: [name] MRN: [id number] Date of Birth: [birth year] Referring Provider:   Flowsheet Row Cardiac Rehab from 05/23/2022 in [facility] Cardiac and Pulmonary Rehab  Referring Provider Dr. Kirk Ruths       Encounter Date: 06/29/2022  Check In:  Session Check In - 06/29/22 1412       Check-In   Supervising physician immediately available to respond to emergencies See telemetry face sheet for immediately available ER MD    Location [facility]-Cardiac & Pulmonary Rehab    Staff Present Alberteen Sam, MA, RCEP, CCRP, CCET;Cheyne Bungert Lemay, RN Odelia Gage, RN, ADN    Virtual Visit No    Medication changes reported     No    Fall or balance concerns reported    No    Warm-up and Cool-down Performed on first and last piece of equipment    Resistance Training Performed Yes    VAD Patient? No    PAD/SET Patient? No      Pain Assessment   Currently in Pain? No/denies                Social History   Tobacco Use  Smoking Status Never  Smokeless Tobacco Never    Goals Met:  Independence with exercise equipment Exercise tolerated well No report of concerns or symptoms today Strength training completed today  Goals Unmet:  Not Applicable  Comments: Pt able to follow exercise prescription today without complaint.  Will continue to monitor for progression.    Dr. Emily Filbert is Medical Director for [place].  Dr. Ottie Glazier is Medical Director for [facility] Pulmonary Rehabilitation.

## 2022-06-30 ENCOUNTER — Encounter: Payer: PPO | Admitting: *Deleted

## 2022-06-30 ENCOUNTER — Ambulatory Visit (HOSPITAL_BASED_OUTPATIENT_CLINIC_OR_DEPARTMENT_OTHER)
Admission: RE | Admit: 2022-06-30 | Discharge: 2022-06-30 | Disposition: A | Discharge status: Home / Self Care | Payer: PPO | Source: Ambulatory Visit | Attending: Internal Medicine | Admitting: Internal Medicine

## 2022-06-30 ENCOUNTER — Ambulatory Visit (INDEPENDENT_AMBULATORY_CARE_PROVIDER_SITE_OTHER): Payer: PPO | Admitting: Internal Medicine

## 2022-06-30 ENCOUNTER — Encounter (HOSPITAL_BASED_OUTPATIENT_CLINIC_OR_DEPARTMENT_OTHER): Payer: Self-pay

## 2022-06-30 ENCOUNTER — Encounter: Payer: Self-pay | Admitting: Internal Medicine

## 2022-06-30 ENCOUNTER — Other Ambulatory Visit: Payer: Self-pay

## 2022-06-30 VITALS — BP 148/62 | HR 88 | Temp 98.8°F | Ht 68.0 in | Wt 243.0 lb

## 2022-06-30 DIAGNOSIS — R109 Unspecified abdominal pain: Secondary | ICD-10-CM | POA: Insufficient documentation

## 2022-06-30 DIAGNOSIS — N3001 Acute cystitis with hematuria: Secondary | ICD-10-CM | POA: Diagnosis not present

## 2022-06-30 DIAGNOSIS — M545 Low back pain, unspecified: Secondary | ICD-10-CM | POA: Diagnosis not present

## 2022-06-30 DIAGNOSIS — Z955 Presence of coronary angioplasty implant and graft: Secondary | ICD-10-CM

## 2022-06-30 LAB — POC URINALSYSI DIPSTICK (AUTOMATED)
Bilirubin, UA: NEGATIVE
Glucose, UA: NEGATIVE
Ketones, UA: NEGATIVE
Nitrite, UA: NEGATIVE
Protein, UA: POSITIVE — AB
Spec Grav, UA: >=1.03 — AB (ref 1.010–1.025)
Urobilinogen, UA: 0.2 E.U./dL
pH, UA: 6 (ref 5.0–8.0)

## 2022-06-30 MED ORDER — TRAMADOL HCL 50 MG PO TABS: 50.0000 mg | ORAL_TABLET | Freq: Three times a day (TID) | ORAL | 0 refills | Status: AC | PRN

## 2022-06-30 MED ORDER — SULFAMETHOXAZOLE-TRIMETHOPRIM 800-160 MG PO TABS: 1.0000 | ORAL_TABLET | Freq: Two times a day (BID) | ORAL | 0 refills | Status: AC

## 2022-06-30 MED ORDER — CYCLOBENZAPRINE HCL 5 MG PO TABS: 5.0000 mg | ORAL_TABLET | Freq: Three times a day (TID) | ORAL | 0 refills | Status: DC | PRN

## 2022-06-30 NOTE — Assessment & Plan Note (Signed)
Acute Urine dip suggestive of UTI Will send urine for culture Start bactrim DS bid x 3 days

## 2022-06-30 NOTE — Assessment & Plan Note (Addendum)
Acute Started last week after moving plants - likely msk in nature - has some features of possible renal stone whch she has had in the past Check CT renal urine dip and culture Flexeril 5-10 mg TID prn tramdol 50 mg tid prn Ok to take tylenol

## 2022-06-30 NOTE — Progress Notes (Signed)
Daily Session Note  Patient Details  Name: [name] MRN: [id number] Date of Birth: [birth year] Referring Provider:   Flowsheet Row Cardiac Rehab from 05/23/2022 in [facility] Cardiac and Pulmonary Rehab  Referring Provider Dr. Kirk Ruths       Encounter Date: 06/30/2022  Check In:  Session Check In - 06/30/22 1415       Check-In   Supervising physician immediately available to respond to emergencies See telemetry face sheet for immediately available ER MD    Location [facility]-Cardiac & Pulmonary Rehab    Staff Present Heath Lark, RN, BSN, CCRP;Joseph Hood, RCP,RRT,BSRT;Noah Tickle, [place], Exercise Physiologist    Virtual Visit No    Medication changes reported     No    Fall or balance concerns reported    No    Warm-up and Cool-down Performed on first and last piece of equipment    Resistance Training Performed Yes    VAD Patient? No    PAD/SET Patient? No      Pain Assessment   Currently in Pain? No/denies                Social History   Tobacco Use  Smoking Status Never  Smokeless Tobacco Never    Goals Met:  Independence with exercise equipment Exercise tolerated well No report of concerns or symptoms today  Goals Unmet:  Not Applicable  Comments: Pt able to follow exercise prescription today without complaint.  Will continue to monitor for progression.    Dr. Emily Filbert is Medical Director for [place].  Dr. Ottie Glazier is Medical Director for [facility] Pulmonary Rehabilitation.

## 2022-06-30 NOTE — Patient Instructions (Addendum)
      Medications changes include :   flexeril 5-10 mg three times a day (muscle relaxer), bactrim for possible UTI.  Tramadol 50 mg three times for pain as needed.    Your prescription(s) have been sent to your pharmacy.    A Ct scan was ordered to rule out a kidney stone.     Someone  will call you to schedule an appointment.    Return if symptoms worsen or fail to improve.

## 2022-06-30 NOTE — Assessment & Plan Note (Addendum)
Acute Lower back and right flank, RLQ pain - started last week after moving plants Likely msk but has features of a kidney stone and has had one in the past Urine dip with blood and ? infection Will check Ct renal scan

## 2022-07-01 ENCOUNTER — Encounter: Payer: Self-pay | Admitting: Nurse Practitioner

## 2022-07-01 ENCOUNTER — Telehealth: Payer: Self-pay

## 2022-07-01 NOTE — Telephone Encounter (Signed)
Pt states that she has taken the Rx that was prescribed yesterday sulfamethoxazole-trimethoprim (BACTRIM DS) 800-160 MG tablet traMADol (ULTRAM) 50 MG tablet cyclobenzaprine (FLEXERIL) 5 MG tablet  Pt states she is vomiting and very dizzy. Pt is wanting the recommendation of the Nurse or PCP as to what she should do.  Pt CB [phone]

## 2022-07-01 NOTE — Telephone Encounter (Signed)
Pt called back to see if there was any update.  I asked Jeralyn Ruths, NP if she could recommend anything for the pt since Dr. Ronnald Ramp hadn't made it back to the office.  Pt declined the options given by the NP and was recommended to return to the ED if sxs persist or gets worse.  FYI

## 2022-07-01 NOTE — Progress Notes (Signed)
This encounter was created in error - please disregard.

## 2022-07-04 LAB — CULTURE, URINE COMPREHENSIVE

## 2022-07-04 MED ORDER — NITROFURANTOIN MONOHYD MACRO 100 MG PO CAPS: 100.0000 mg | ORAL_CAPSULE | Freq: Two times a day (BID) | ORAL | 0 refills | Status: DC

## 2022-07-04 NOTE — Addendum Note (Signed)
Addended by: Binnie Rail on: 07/04/2022 04:05 PM   Modules accepted: Orders

## 2022-07-06 ENCOUNTER — Encounter: Payer: PPO | Admitting: *Deleted

## 2022-07-06 DIAGNOSIS — Z955 Presence of coronary angioplasty implant and graft: Secondary | ICD-10-CM

## 2022-07-06 NOTE — Progress Notes (Signed)
Daily Session Note  Patient Details  Name: Lorri Fukuhara MRN: [id number] Date of Birth: 08-Aug-1946 Referring Provider:   Flowsheet Row Cardiac Rehab from 05/23/2022 in [facility] Cardiac and Pulmonary Rehab  Referring Provider Dr. Kirk Ruths       Encounter Date: 07/06/2022  Check In:  Session Check In - 07/06/22 1357       Check-In   Supervising physician immediately available to respond to emergencies See telemetry face sheet for immediately available ER MD    Location [facility]-Cardiac & Pulmonary Rehab    Staff Present Alberteen Sam, MA, RCEP, CCRP, Rosalio Macadamia, BS, ACSM CEP, Exercise Physiologist;Aleecia Tapia Sherryll Burger, RN BSN    Virtual Visit No    Medication changes reported     No    Fall or balance concerns reported    No    Warm-up and Cool-down Performed on first and last piece of equipment    Resistance Training Performed Yes    VAD Patient? No    PAD/SET Patient? No      Pain Assessment   Currently in Pain? No/denies                Social History   Tobacco Use  Smoking Status Never  Smokeless Tobacco Never    Goals Met:  Independence with exercise equipment Exercise tolerated well No report of concerns or symptoms today Strength training completed today  Goals Unmet:  Not Applicable  Comments: Pt able to follow exercise prescription today without complaint.  Will continue to monitor for progression.    Dr. Emily Filbert is Medical Director for [place].  Dr. Ottie Glazier is Medical Director for [facility] Pulmonary Rehabilitation.

## 2022-07-07 ENCOUNTER — Encounter: Payer: PPO | Admitting: *Deleted

## 2022-07-07 DIAGNOSIS — Z955 Presence of coronary angioplasty implant and graft: Secondary | ICD-10-CM | POA: Diagnosis not present

## 2022-07-07 NOTE — Progress Notes (Signed)
Daily Session Note  Patient Details  Name: [name] MRN: [id number] Date of Birth: [birth year] Referring Provider:   Flowsheet Row Cardiac Rehab from 05/23/2022 in [facility] Cardiac and Pulmonary Rehab  Referring Provider Dr. Kirk Ruths       Encounter Date: 07/07/2022  Check In:  Session Check In - 07/07/22 1419       Check-In   Supervising physician immediately available to respond to emergencies See telemetry face sheet for immediately available ER MD    Location [facility]-Cardiac & Pulmonary Rehab    Staff Present Antionette Fairy, BS, Exercise Physiologist;Jessica [place], MA, RCEP, CCRP, Mindi Curling, RN, [place]    Virtual Visit No    Medication changes reported     No    Fall or balance concerns reported    No    Warm-up and Cool-down Performed on first and last piece of equipment    Resistance Training Performed Yes    VAD Patient? No    PAD/SET Patient? No      Pain Assessment   Currently in Pain? No/denies                Social History   Tobacco Use  Smoking Status Never  Smokeless Tobacco Never    Goals Met:  Independence with exercise equipment Exercise tolerated well No report of concerns or symptoms today Strength training completed today  Goals Unmet:  Not Applicable  Comments: Pt able to follow exercise prescription today without complaint.  Will continue to monitor for progression.    Dr. Emily Filbert is Medical Director for [place].  Dr. Ottie Glazier is Medical Director for [facility] Pulmonary Rehabilitation.

## 2022-07-13 ENCOUNTER — Telehealth: Payer: Self-pay

## 2022-07-13 ENCOUNTER — Encounter: Payer: PPO | Admitting: *Deleted

## 2022-07-13 ENCOUNTER — Other Ambulatory Visit: Payer: Self-pay

## 2022-07-13 DIAGNOSIS — Z955 Presence of coronary angioplasty implant and graft: Secondary | ICD-10-CM | POA: Diagnosis not present

## 2022-07-13 DIAGNOSIS — R300 Dysuria: Secondary | ICD-10-CM

## 2022-07-13 NOTE — Telephone Encounter (Signed)
Orders placed.

## 2022-07-13 NOTE — Progress Notes (Signed)
Daily Session Note  Patient Details  Name: [name] MRN: [id number] Date of Birth: [birth year] Referring Provider:   Flowsheet Row Cardiac Rehab from 05/23/2022 in [facility] Cardiac and Pulmonary Rehab  Referring Provider Dr. Kirk Ruths       Encounter Date: 07/13/2022  Check In:  Session Check In - 07/13/22 1351       Check-In   Supervising physician immediately available to respond to emergencies See telemetry face sheet for immediately available ER MD    Location [facility]-Cardiac & Pulmonary Rehab    Staff Present Earlean Shawl, BS, ACSM CEP, Exercise Physiologist;Kara Eliezer Bottom, MS, ASCM CEP, Exercise Physiologist;Meredith Sherryll Burger, RN Odelia Gage, RN, ADN    Virtual Visit No    Medication changes reported     No    Fall or balance concerns reported    No    Warm-up and Cool-down Performed on first and last piece of equipment    Resistance Training Performed Yes    VAD Patient? No    PAD/SET Patient? No      Pain Assessment   Currently in Pain? No/denies                Social History   Tobacco Use  Smoking Status Never  Smokeless Tobacco Never    Goals Met:  Independence with exercise equipment Exercise tolerated well No report of concerns or symptoms today Strength training completed today  Goals Unmet:  Not Applicable  Comments: Pt able to follow exercise prescription today without complaint.  Will continue to monitor for progression.    Dr. Emily Filbert is Medical Director for [place].  Dr. Ottie Glazier is Medical Director for [facility] Pulmonary Rehabilitation.

## 2022-07-13 NOTE — Telephone Encounter (Signed)
Patient has completed both antibiotics, states she did research and found out how serious the bacteria is. Since completing the antibiotics, she still has R side back pain. Wants to know how serious this bacteria is.

## 2022-07-13 NOTE — Telephone Encounter (Signed)
I think her back pain is musculoskeletal in nature.  If it is not improving we can consider having her see sports medicine for further evaluation and treatment.  She had a relatively small amount of bacteria in the urine culture compared to most urinary tract infections.  I think that infection has been successfully treated.

## 2022-07-14 ENCOUNTER — Encounter: Payer: PPO | Admitting: *Deleted

## 2022-07-14 DIAGNOSIS — Z955 Presence of coronary angioplasty implant and graft: Secondary | ICD-10-CM | POA: Diagnosis not present

## 2022-07-14 NOTE — Progress Notes (Signed)
Daily Session Note  Patient Details  Name: [name] MRN: [id number] Date of Birth: 1946-09-04 Referring Provider:   Flowsheet Row Cardiac Rehab from 05/23/2022 in [facility] Cardiac and Pulmonary Rehab  Referring Provider Dr. Kirk Ruths       Encounter Date: 07/14/2022  Check In:  Session Check In - 07/14/22 1346       Check-In   Supervising physician immediately available to respond to emergencies See telemetry face sheet for immediately available ER MD    Location [facility]-Cardiac & Pulmonary Rehab    Staff Present Justin Mend, Lorre Nick, MA, RCEP, CCRP, CCET;Aitanna Haubner Sherryll Burger, RN BSN    Virtual Visit No    Medication changes reported     No    Fall or balance concerns reported    No    Warm-up and Cool-down Performed on first and last piece of equipment    Resistance Training Performed Yes    VAD Patient? No    PAD/SET Patient? No      Pain Assessment   Currently in Pain? No/denies                Social History   Tobacco Use  Smoking Status Never  Smokeless Tobacco Never    Goals Met:  Independence with exercise equipment Exercise tolerated well No report of concerns or symptoms today Strength training completed today  Goals Unmet:  Not Applicable  Comments: Pt able to follow exercise prescription today without complaint.  Will continue to monitor for progression.    Dr. Emily Filbert is Medical Director for [place].  Dr. Ottie Glazier is Medical Director for [facility] Pulmonary Rehabilitation.

## 2022-07-15 ENCOUNTER — Telehealth: Payer: Self-pay | Admitting: Nurse Practitioner

## 2022-07-15 NOTE — Telephone Encounter (Signed)
Spoke with pt, patient made aware emily is not in the office until Monday. Aware she can stop the rosuvastatin over the weekend to see if that makes her symptoms. She is fine to wait until emily returns for her to give option

## 2022-07-15 NOTE — Telephone Encounter (Signed)
Pt called stating she's been experiencing muscle ache and fatigue since starting cardiac medication. Pt stated BP has been WNL range but stated she can't continue to live like this. Pt questioning if NP feels the following will help.  B-12,  D-3  CO-Q10 Prevagen

## 2022-07-15 NOTE — Telephone Encounter (Signed)
New Message:    Patient says she have been having body aches and no energy at all. She wants to know if she can take B-12, D-3 or CO-Q10-, Prevagen for Memory?    Pt c/o medication issue:  1. Name of Medication: B-12, D-3, CO-Q 10 and Pe  2. How are you currently taking this medication (dosage and times per day)?   3. Are you having a reaction (difficulty breathing--STAT)?   4. What is your medication issue? Patient wants to know if she can take these medicine and if so, what milligram? Prevagen for memory

## 2022-07-15 NOTE — Telephone Encounter (Signed)
Patient calling back.   °

## 2022-07-18 ENCOUNTER — Other Ambulatory Visit: Payer: Self-pay

## 2022-07-18 DIAGNOSIS — Z5309 Procedure and treatment not carried out because of other contraindication: Secondary | ICD-10-CM

## 2022-07-18 NOTE — Telephone Encounter (Signed)
Please arrange Pharm D clinic appointment for consideration of [facility]-9.

## 2022-07-18 NOTE — Telephone Encounter (Signed)
Spoke with pt. Pt is aware that she is to d/c Rosuvastatin as directed and f/u with Pharm-D Clinc. Referral sent to Pharm -D

## 2022-07-19 ENCOUNTER — Other Ambulatory Visit (INDEPENDENT_AMBULATORY_CARE_PROVIDER_SITE_OTHER): Payer: PPO

## 2022-07-19 DIAGNOSIS — R300 Dysuria: Secondary | ICD-10-CM | POA: Diagnosis not present

## 2022-07-19 LAB — URINALYSIS, ROUTINE W REFLEX MICROSCOPIC
Bilirubin Urine: NEGATIVE
Ketones, ur: NEGATIVE
Leukocytes,Ua: NEGATIVE
Nitrite: NEGATIVE
Specific Gravity, Urine: 1.02 (ref 1.000–1.030)
Total Protein, Urine: 100 — AB
Urine Glucose: NEGATIVE
Urobilinogen, UA: 0.2 (ref 0.0–1.0)
pH: 6.5 (ref 5.0–8.0)

## 2022-07-20 ENCOUNTER — Encounter: Payer: PPO | Admitting: *Deleted

## 2022-07-20 DIAGNOSIS — Z955 Presence of coronary angioplasty implant and graft: Secondary | ICD-10-CM

## 2022-07-20 NOTE — Progress Notes (Signed)
Daily Session Note  Patient Details  Name: [name] MRN: [id number] Date of Birth: 03/29/1946 Referring Provider:   Flowsheet Row Cardiac Rehab from 05/23/2022 in [facility] Cardiac and Pulmonary Rehab  Referring Provider Dr. Kirk Ruths       Encounter Date: 07/20/2022  Check In:  Session Check In - 07/20/22 1355       Check-In   Supervising physician immediately available to respond to emergencies See telemetry face sheet for immediately available ER MD    Location [facility]-Cardiac & Pulmonary Rehab    Staff Present Coralie Keens, MS, ASCM CEP, Exercise Physiologist;Jessica Luan Pulling, MA, RCEP, CCRP, CCET;Evelisse Szalkowski [place], RN Odelia Gage, RN, ADN    Virtual Visit No    Medication changes reported     No    Fall or balance concerns reported    No    Warm-up and Cool-down Performed on first and last piece of equipment    Resistance Training Performed Yes    VAD Patient? No    PAD/SET Patient? No      Pain Assessment   Currently in Pain? No/denies                Social History   Tobacco Use  Smoking Status Never  Smokeless Tobacco Never    Goals Met:  Independence with exercise equipment Exercise tolerated well No report of concerns or symptoms today Strength training completed today  Goals Unmet:  Not Applicable  Comments: Pt able to follow exercise prescription today without complaint.  Will continue to monitor for progression.    Dr. Emily Filbert is Medical Director for [facility].  Dr. Ottie Glazier is Medical Director for [facility].

## 2022-07-21 ENCOUNTER — Encounter: Payer: PPO | Admitting: *Deleted

## 2022-07-21 DIAGNOSIS — Z955 Presence of coronary angioplasty implant and graft: Secondary | ICD-10-CM | POA: Diagnosis not present

## 2022-07-21 NOTE — Progress Notes (Signed)
Daily Session Note  Patient Details  Name: [name] MRN: [id number] Date of Birth: 04/25/1946 Referring Provider:   Flowsheet Row Cardiac Rehab from 05/23/2022 in [facility] Cardiac and Pulmonary Rehab  Referring Provider Dr. Kirk Ruths       Encounter Date: 07/21/2022  Check In:  Session Check In - 07/21/22 1352       Check-In   Supervising physician immediately available to respond to emergencies See telemetry face sheet for immediately available ER MD    Location [facility]-Cardiac & Pulmonary Rehab    Staff Present Antionette Fairy, BS, Exercise Physiologist;Joseph Rosebud Poles, RN, [place]    Virtual Visit No    Medication changes reported     No    Fall or balance concerns reported    No    Warm-up and Cool-down Performed on first and last piece of equipment    Resistance Training Performed Yes    VAD Patient? No    PAD/SET Patient? No      Pain Assessment   Currently in Pain? No/denies                Social History   Tobacco Use  Smoking Status Never  Smokeless Tobacco Never    Goals Met:  Independence with exercise equipment Exercise tolerated well No report of concerns or symptoms today Strength training completed today  Goals Unmet:  Not Applicable  Comments: Pt able to follow exercise prescription today without complaint.  Will continue to monitor for progression.    Dr. Emily Filbert is Medical Director for [facility].  Dr. Ottie Glazier is Medical Director for [facility] Pulmonary Rehabilitation.

## 2022-07-22 ENCOUNTER — Telehealth: Payer: Self-pay | Admitting: Internal Medicine

## 2022-07-22 ENCOUNTER — Other Ambulatory Visit (HOSPITAL_COMMUNITY): Payer: Self-pay

## 2022-07-22 LAB — CULTURE, URINE COMPREHENSIVE: RESULT:: NO GROWTH

## 2022-07-22 NOTE — Telephone Encounter (Signed)
Pt request return call to advise why past urinalysis have shown abnormal.

## 2022-07-25 NOTE — Telephone Encounter (Signed)
Spoke with patient today. 

## 2022-07-26 ENCOUNTER — Other Ambulatory Visit: Payer: Self-pay

## 2022-07-26 ENCOUNTER — Encounter: Payer: Self-pay | Admitting: Internal Medicine

## 2022-07-26 ENCOUNTER — Ambulatory Visit: Payer: PPO | Attending: Cardiology | Admitting: Pharmacist Clinician (PhC)/ Clinical Pharmacy Specialist

## 2022-07-26 ENCOUNTER — Emergency Department (HOSPITAL_BASED_OUTPATIENT_CLINIC_OR_DEPARTMENT_OTHER): Payer: PPO | Admitting: Radiology

## 2022-07-26 ENCOUNTER — Encounter (HOSPITAL_BASED_OUTPATIENT_CLINIC_OR_DEPARTMENT_OTHER): Payer: Self-pay

## 2022-07-26 ENCOUNTER — Emergency Department (HOSPITAL_BASED_OUTPATIENT_CLINIC_OR_DEPARTMENT_OTHER)
Admission: EM | Admit: 2022-07-26 | Discharge: 2022-07-26 | Disposition: A | Discharge status: Home / Self Care | Payer: PPO | Attending: Emergency Medicine | Admitting: Emergency Medicine

## 2022-07-26 DIAGNOSIS — Z7982 Long term (current) use of aspirin: Secondary | ICD-10-CM | POA: Insufficient documentation

## 2022-07-26 DIAGNOSIS — M545 Low back pain, unspecified: Secondary | ICD-10-CM | POA: Insufficient documentation

## 2022-07-26 DIAGNOSIS — E7800 Pure hypercholesterolemia, unspecified: Secondary | ICD-10-CM

## 2022-07-26 DIAGNOSIS — R109 Unspecified abdominal pain: Secondary | ICD-10-CM | POA: Diagnosis not present

## 2022-07-26 DIAGNOSIS — K5900 Constipation, unspecified: Secondary | ICD-10-CM | POA: Diagnosis not present

## 2022-07-26 LAB — CBC WITH DIFFERENTIAL/PLATELET
Abs Immature Granulocytes: 0.01 10*3/uL (ref 0.00–0.07)
Basophils Absolute: 0 10*3/uL (ref 0.0–0.1)
Basophils Relative: 1 %
Eosinophils Absolute: 0.1 10*3/uL (ref 0.0–0.5)
Eosinophils Relative: 2 %
HCT: 39.2 % (ref 36.0–46.0)
Hemoglobin: 12.6 g/dL (ref 12.0–15.0)
Immature Granulocytes: 0 %
Lymphocytes Relative: 26 %
Lymphs Abs: 1.6 10*3/uL (ref 0.7–4.0)
MCH: 29.3 pg (ref 26.0–34.0)
MCHC: 32.1 g/dL (ref 30.0–36.0)
MCV: 91.2 fL (ref 80.0–100.0)
Monocytes Absolute: 0.4 10*3/uL (ref 0.1–1.0)
Monocytes Relative: 7 %
Neutro Abs: 3.9 10*3/uL (ref 1.7–7.7)
Neutrophils Relative %: 64 %
Platelets: 151 10*3/uL (ref 150–400)
RBC: 4.3 MIL/uL (ref 3.87–5.11)
RDW: 13.3 % (ref 11.5–15.5)
WBC: 6 10*3/uL (ref 4.0–10.5)
nRBC: 0 % (ref 0.0–0.2)

## 2022-07-26 LAB — COMPREHENSIVE METABOLIC PANEL
ALT: 49 U/L — ABNORMAL HIGH (ref 0–44)
AST: 37 U/L (ref 15–41)
Albumin: 3.8 g/dL (ref 3.5–5.0)
Alkaline Phosphatase: 53 U/L (ref 38–126)
Anion gap: 5 (ref 5–15)
BUN: 15 mg/dL (ref 8–23)
CO2: 29 mmol/L (ref 22–32)
Calcium: 9 mg/dL (ref 8.9–10.3)
Chloride: 103 mmol/L (ref 98–111)
Creatinine, Ser: 1.08 mg/dL — ABNORMAL HIGH (ref 0.44–1.00)
GFR, Estimated: 54 mL/min — ABNORMAL LOW (ref >60)
Glucose, Bld: 136 mg/dL — ABNORMAL HIGH (ref 70–99)
Potassium: 4.2 mmol/L (ref 3.5–5.1)
Sodium: 137 mmol/L (ref 135–145)
Total Bilirubin: 0.6 mg/dL (ref 0.3–1.2)
Total Protein: 6.9 g/dL (ref 6.5–8.1)

## 2022-07-26 LAB — LIPASE, BLOOD: Lipase: 16 U/L (ref 11–51)

## 2022-07-26 NOTE — Discharge Instructions (Signed)
For constipation: Fleet enema Fleet glycerine suppository Milk of magnesia Magnesium citrate Colace  Use as directed  SEEK IMMEDIATE MEDICAL ATTENTION IF: New numbness, tingling, weakness, or problem with the use of your arms or legs.  Severe back pain not relieved with medications.  Change in bowel or bladder control.  Increasing pain in any areas of the body (such as chest or abdominal pain).  Shortness of breath, dizziness or fainting.  Nausea (feeling sick to your stomach), vomiting, fever, or sweats.

## 2022-07-26 NOTE — ED Notes (Signed)
Discharge paperwork given and verbally understood. 

## 2022-07-26 NOTE — Patient Instructions (Signed)
Your Results:             Your most recent labs Goal  Total Cholesterol 120 < 200  Triglycerides 68 < 150  HDL (happy/good cholesterol) 56 > 40  LDL (lousy/bad cholesterol 50 < 70   Medication changes:  Stop rosuvastatin for 1 week.  Note if your muscles/joints start to feel better.  Start the [place] once daily.  Then re-start the rosuvastatin after 1 week.  If your aches/pains/fatigue all come back, please call me.    Lab orders:  We want to repeat labs after 2-3 months.  We will send you a lab order to remind you once we get closer to that time.     Thank you for choosing [facility] [phone]

## 2022-07-26 NOTE — Progress Notes (Signed)
07/27/2022 [name] 07-08-1946 [id number]   HPI:  [name] is a 76 y.o. female patient of Dr Stanford Breed, who presents today for a lipid clinic evaluation.  See pertinent past medical history below.  After her heart cath in July she was started on rosuvastatin 20 mg daily.  Earlier this month she began to notice muscle aches and fatigue.  She was asked to stop the rosuvastatin and scheduled to be seen in [facility]  Today she brings all of her medications for review.  She also has 3 supplement she would like to take (CoQ10, B12 and magnesium) and wants to be sure they will be safe with her other medications.    Past Medical History: CAD 5/23 Coronary calcium score 420 (85th percentile) 7/23 - heart cath DES to mid RCA; dominant RCA  Pre-DM 5/23 A1c 5.8 (highest 6.0 - 2015)  Venous insufficiency Followed by Vascular    Insurance Carrier:     [employer]  ($47/$136) 3 mo supply for $ of 2 prior to CG  Current Medications:   ezetimibe 10 mg qd, rosuvastatin 40 mg qd    Cholesterol Goals:  LDL < 70   Intolerant/previously tried:  pravastatin  - myalgias       Diet: not much pork or beef; drinks almond milk; trying to be conscious of heart heathy foods  Exercise:  cardiac rehab  Lipid Panel     Component Value Date/Time   CHOL 120 05/25/2022 0905   TRIG 68 05/25/2022 0905   HDL 56 05/25/2022 0905   CHOLHDL 2.1 05/25/2022 0905   CHOLHDL 3 01/31/2022 0901   VLDL 18.4 01/31/2022 0901   LDLCALC 50 05/25/2022 0905   LDLDIRECT 152.1 06/26/2013 0836   LABVLDL 14 05/25/2022 0905     Current Outpatient Medications  Medication Sig Dispense Refill   aspirin 81 MG tablet Take 81 mg by mouth daily.     ezetimibe (ZETIA) 10 MG tablet Take 1 tablet (10 mg total) by mouth daily. 90 tablet 3   levothyroxine (SYNTHROID) 50 MCG tablet TAKE 1 TABLET BY MOUTH EVERY DAY 90 tablet 3   metoprolol succinate (TOPROL XL) 25 MG 24 hr tablet Take 1 tablet (25  mg total) by mouth daily. 90 tablet 3   omeprazole (PRILOSEC) 20 MG capsule Take 1 capsule (20 mg total) by mouth daily as needed. (Patient taking differently: Take 20 mg by mouth daily.) 90 capsule 3   rosuvastatin (CRESTOR) 40 MG tablet Take 1 tablet (40 mg total) by mouth daily. 90 tablet 3   ticagrelor (BRILINTA) 90 MG TABS tablet Take 1 tablet (90 mg total) by mouth 2 (two) times daily. 60 tablet 11   cyclobenzaprine (FLEXERIL) 5 MG tablet Take 1-2 tablets (5-10 mg total) by mouth 3 (three) times daily as needed for muscle spasms. (Patient not taking: Reported on 07/26/2022) 30 tablet 0   No current facility-administered medications for this visit.    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   Hyperlipidemia Patient with hyperlipidemia and CAD, with elevated calcium score.  Unsure if her complaints of body aches are due to rosuvastatin, but she is hesitant to stop because of calcium score.  She would like to start CoQ10, so will have her hold rosuvastatin for 1 week and start 200-300 mg of CoQ10 tonight.  She can restart rosuvastatin in 1 week, but if myalgias return, she is to contact me for further options.  Discussed mechanisms of action, dosing, side effects and potential decreases in LDL cholesterol of the statin drugs as well as overview of  other options, although not in detail.  Answered all patient questions.  Based on this information, patient would prefer to start      Tommy Medal PharmD CPP [facility] [address] [place], [place] [address] [phone]

## 2022-07-26 NOTE — ED Provider Notes (Signed)
[facility] EMERGENCY DEPT Provider Note   CSN: [id number] Arrival date & time: 07/26/22  1254     History  Chief Complaint  Patient presents with   Back Pain    [name] is a 76 y.o. female who presents with 1 month of Low back pain. Pain is BL, R>L, aching.  Worse with movement, position change forward flexion and twisting. Better with tylenol and heat. Denies weakness, loss of bowel/bladder function or saddle anesthesia. Denies neck stiffness, headache, rash.  Denies fever or recent procedures to back. Patient also has constipation x1 week. + gas and cramping. Took dulcolax without relief. She self disimpacted soft brown stool on Saturday. Denies abdominal pain, nausea, vomiting.   Back Pain      Home Medications Prior to Admission medications   Medication Sig Start Date End Date Taking? Authorizing Provider  aspirin 81 MG tablet Take 81 mg by mouth daily.    [provider]  cyclobenzaprine (FLEXERIL) 5 MG tablet Take 1-2 tablets (5-10 mg total) by mouth 3 (three) times daily as needed for muscle spasms. Patient not taking: Reported on 07/26/2022 06/30/22   Binnie Rail, MD  ezetimibe (ZETIA) 10 MG tablet Take 1 tablet (10 mg total) by mouth daily. 05/06/22 05/01/23  Troy Sine, MD  levothyroxine (SYNTHROID) 50 MCG tablet TAKE 1 TABLET BY MOUTH EVERY DAY 11/08/21   Binnie Rail, MD  metoprolol succinate (TOPROL XL) 25 MG 24 hr tablet Take 1 tablet (25 mg total) by mouth daily. 02/17/22   Lelon Perla, MD  omeprazole (PRILOSEC) 20 MG capsule Take 1 capsule (20 mg total) by mouth daily as needed. Patient taking differently: Take 20 mg by mouth daily. 02/02/22   Binnie Rail, MD  rosuvastatin (CRESTOR) 40 MG tablet Take 1 tablet (40 mg total) by mouth daily. 05/03/22   Almyra Deforest, PA  ticagrelor (BRILINTA) 90 MG TABS tablet Take 1 tablet (90 mg total) by mouth 2 (two) times daily. 05/03/22   Almyra Deforest, PA      Allergies     Atorvastatin and Augmentin [amoxicillin-pot clavulanate]    Review of Systems   Review of Systems  Musculoskeletal:  Positive for back pain.    Physical Exam Updated Vital Signs BP (!) 124/50 (BP Location: Right Arm)   Pulse (!) 58   Temp 98.2 F (36.8 C)   Resp 16   SpO2 96%  Physical Exam Vitals and nursing note reviewed.  Constitutional:      General: She is not in acute distress.    Appearance: She is well-developed. She is not diaphoretic.  HENT:     Head: Normocephalic and atraumatic.     Right Ear: External ear normal.     Left Ear: External ear normal.     Nose: Nose normal.     Mouth/Throat:     Mouth: Mucous membranes are moist.  Eyes:     General: No scleral icterus.    Conjunctiva/sclera: Conjunctivae normal.  Cardiovascular:     Rate and Rhythm: Normal rate and regular rhythm.     Heart sounds: Normal heart sounds. No murmur heard.    No friction rub. No gallop.  Pulmonary:     Effort: Pulmonary effort is normal. No respiratory distress.     Breath sounds: Normal breath sounds.  Abdominal:     General: Bowel sounds are normal. There is no distension.     Palpations: Abdomen is soft. There is no mass.  Tenderness: There is no abdominal tenderness. There is no right CVA tenderness, left CVA tenderness or guarding.  Musculoskeletal:     Cervical back: Normal range of motion.     Comments: Patient appears to be in mild to moderate pain, antalgic gait noted. Lumbosacral spine area reveals no local tenderness or mass. Painful and reduced LS ROM noted. Straight leg raise is negative . DTR's, motor strength and sensation normal, including heel and toe gait.  Peripheral pulses are palpable.   Skin:    General: Skin is warm and dry.  Neurological:     Mental Status: She is alert and oriented to person, place, and time.  Psychiatric:        Behavior: Behavior normal.     ED Results / Procedures / Treatments   Labs (all labs ordered are listed, but only  abnormal results are displayed) Labs Reviewed  COMPREHENSIVE METABOLIC PANEL - Abnormal; Notable for the following components:      Result Value   Glucose, Bld 136 (*)    Creatinine, Ser 1.08 (*)    ALT 49 (*)    GFR, Estimated 54 (*)    All other components within normal limits  LIPASE, BLOOD  CBC WITH DIFFERENTIAL/PLATELET    EKG None  Radiology DG Abdomen 1 View  Result Date: 07/26/2022 CLINICAL DATA:  Abdominal pain and back pain EXAM: ABDOMEN - 1 VIEW COMPARISON:  CT abdomen pelvis 06/30/2022 FINDINGS: Surgical clips in the gallbladder fossa. Normal bowel gas pattern. Negative for urinary tract calculi. No acute skeletal abnormality. IMPRESSION: Negative. Electronically Signed   By: Franchot Gallo M.D.   On: 07/26/2022 14:14    Procedures Procedures    Medications Ordered in ED Medications - No data to display  ED Course/ Medical Decision Making/ A&P Clinical Course as of 07/26/22 1525  Tue Jul 26, 2022  1524 Lipase, blood [AH]  1525 CBC with Diff [AH]  1525 Comprehensive metabolic panel(!) Labs reviewed show no acute findings [AH]  1525 DG Abdomen 1 View I visualized and interpreted 1 view abdominal x-ray.  Does appear to be in moderate stool burden.  No significant abnormalities noted [AH]    Clinical Course User Index [AH] Margarita Mail, PA-C                           Medical Decision Making Amount and/or Complexity of Data Reviewed Independent Historian: friend Labs: ordered. Decision-making details documented in ED Course. Radiology: ordered and independent interpretation performed.   76 year old female who presents with low back pain for the past month and constipation for 1 week. She has no red flag symptoms.  Her pain appears to be musculoskeletal.  Patient has treatment and home including tramadol, Tylenol, muscle relaxer.  She is currently on Brilinta and aspirin so I do not think NSAID would be a good choice for her especially with her recent  MI. Patient also having constipation.  No signs or symptoms of bowel obstruction, volvulus, intussusception.  She has a nontender abdomen.  Advise over-the-counter medications, increase fluid intake.  She is having no signs or symptoms of fecal impaction.  Appears otherwise appropriate for discharge at this time        Final Clinical Impression(s) / ED Diagnoses Final diagnoses:  None    Rx / DC Orders ED Discharge Orders     None         Margarita Mail, PA-C 07/26/22 1527    Joseph Berkshire  J, MD 08/04/22 [phone]

## 2022-07-26 NOTE — ED Triage Notes (Addendum)
Pt presents POV from home, ambulatory in triage, ca/ox4   Pt reports ongoing lower back pain for 3 weeks. Pt reports the pain is bilateral, radiates around to her Right side and into her abd.   Pt seen here 3 weeks ago for kidney stones, CT negative. Pt reports last BM was Saturday but she had to "physically had to disimpact herself" prior to that she [place] unsure of her last BM   Pt denies hematuria, burning with urination, abnormal vaginal odor/dc

## 2022-07-27 ENCOUNTER — Encounter: Payer: PPO | Attending: Cardiology | Admitting: *Deleted

## 2022-07-27 ENCOUNTER — Encounter: Payer: Self-pay | Admitting: *Deleted

## 2022-07-27 ENCOUNTER — Encounter: Payer: Self-pay | Admitting: Pharmacist Clinician (PhC)/ Clinical Pharmacy Specialist

## 2022-07-27 DIAGNOSIS — Z955 Presence of coronary angioplasty implant and graft: Secondary | ICD-10-CM | POA: Diagnosis not present

## 2022-07-27 NOTE — Assessment & Plan Note (Signed)
Patient with hyperlipidemia and CAD, with elevated calcium score.  Unsure if her complaints of body aches are due to rosuvastatin, but she is hesitant to stop because of calcium score.  She would like to start CoQ10, so will have her hold rosuvastatin for 1 week and start 200-300 mg of CoQ10 tonight.  She can restart rosuvastatin in 1 week, but if myalgias return, she is to contact me for further options.  Discussed mechanisms of action, dosing, side effects and potential decreases in LDL cholesterol of the statin drugs as well as overview of other options, although not in detail.  Answered all patient questions.  Based on this information, patient would prefer to start

## 2022-07-27 NOTE — Progress Notes (Signed)
Cardiac Individual Treatment Plan  Patient Details  Name: Sonoma Firkus MRN: [id number] Date of Birth: November 25, 1945 Referring Provider:   Flowsheet Row Cardiac Rehab from 05/23/2022 in [facility] Cardiac and Pulmonary Rehab  Referring Provider Dr. Kirk Ruths       Initial Encounter Date:  Flowsheet Row Cardiac Rehab from 05/23/2022 in [facility] Cardiac and Pulmonary Rehab  Date 05/23/22       Visit Diagnosis: Status post coronary artery stent placement  Patient's Home Medications on Admission:  Current Outpatient Medications:    aspirin 81 MG tablet, Take 81 mg by mouth daily., Disp: , Rfl:    cyclobenzaprine (FLEXERIL) 5 MG tablet, Take 1-2 tablets (5-10 mg total) by mouth 3 (three) times daily as needed for muscle spasms. (Patient not taking: Reported on 07/26/2022), Disp: 30 tablet, Rfl: 0   ezetimibe (ZETIA) 10 MG tablet, Take 1 tablet (10 mg total) by mouth daily., Disp: 90 tablet, Rfl: 3   levothyroxine (SYNTHROID) 50 MCG tablet, TAKE 1 TABLET BY MOUTH EVERY DAY, Disp: 90 tablet, Rfl: 3   metoprolol succinate (TOPROL XL) 25 MG 24 hr tablet, Take 1 tablet (25 mg total) by mouth daily., Disp: 90 tablet, Rfl: 3   omeprazole (PRILOSEC) 20 MG capsule, Take 1 capsule (20 mg total) by mouth daily as needed. (Patient taking differently: Take 20 mg by mouth daily.), Disp: 90 capsule, Rfl: 3   rosuvastatin (CRESTOR) 40 MG tablet, Take 1 tablet (40 mg total) by mouth daily., Disp: 90 tablet, Rfl: 3   ticagrelor (BRILINTA) 90 MG TABS tablet, Take 1 tablet (90 mg total) by mouth 2 (two) times daily., Disp: 60 tablet, Rfl: 11  Past Medical History: Past Medical History:  Diagnosis Date   Anxiety and depression 09/11/2016   Borderline diabetes mellitus    Chest pain    Elevated TSH 06/23/2017   GERD (gastroesophageal reflux disease)    Grief reaction    Hyperlipidemia    Palpitations    Personal history of urinary calculi    Prediabetes 06/21/2016   Squamous cell skin cancer     bridge of nose ( Dr Tonia Brooms)   Subclinical hypothyroidism 05/17/2011   Venous insufficiency of right leg 06/23/2017   Follows with vascular   Weakness     Tobacco Use: Social History   Tobacco Use  Smoking Status Never  Smokeless Tobacco Never    Labs: Review Flowsheet  More data exists      Latest Ref Rng & Units 01/01/2020 10/21/2020 04/16/2021 01/31/2022 05/25/2022  Labs for ITP Cardiac and Pulmonary Rehab  Cholestrol 100 - 199 mg/dL - 208  221  219  120   LDL (calc) 0 - 99 mg/dL - 125  135  136  50   HDL-C >39 mg/dL - 64.60  65.00  64.00  56   Trlycerides 0 - 149 mg/dL - 93.0  102.0  92.0  68   Hemoglobin A1c 4.6 - 6.5 % 5.6  5.8  5.8  5.8  -     Exercise Target Goals: Exercise Program Goal: Individual exercise prescription set using results from initial 6 min walk test and THRR while considering  patient's activity barriers and safety.   Exercise Prescription Goal: Initial exercise prescription builds to 30-45 minutes a day of aerobic activity, 2-3 days per week.  Home exercise guidelines will be given to patient during program as part of exercise prescription that the participant will acknowledge.   Education: Aerobic Exercise: - Group verbal and visual presentation on  the components of exercise prescription. Introduces F.I.T.T principle from ACSM for exercise prescriptions.  Reviews F.I.T.T. principles of aerobic exercise including progression. Written material given at graduation. Flowsheet Row Cardiac Rehab from 07/20/2022 in [facility] Cardiac and Pulmonary Rehab  Date 06/01/22  Educator [facility]  Instruction Review Code 1- Verbalizes Understanding       Education: Resistance Exercise: - Group verbal and visual presentation on the components of exercise prescription. Introduces F.I.T.T principle from ACSM for exercise prescriptions  Reviews F.I.T.T. principles of resistance exercise including progression. Written material given at graduation. Flowsheet Row Cardiac Rehab from  07/20/2022 in [facility] Cardiac and Pulmonary Rehab  Date 06/08/22  Educator NT  Instruction Review Code 1- Verbalizes Understanding        Education: Exercise & Equipment Safety: - Individual verbal instruction and demonstration of equipment use and safety with use of the equipment. Flowsheet Row Cardiac Rehab from 07/20/2022 in [facility] Cardiac and Pulmonary Rehab  Date 05/23/22  Educator [facility]  Instruction Review Code 1- Verbalizes Understanding       Education: Exercise Physiology & General Exercise Guidelines: - Group verbal and written instruction with models to review the exercise physiology of the cardiovascular system and associated critical values. Provides general exercise guidelines with specific guidelines to those with heart or lung disease.  Flowsheet Row Cardiac Rehab from 07/20/2022 in [facility] Cardiac and Pulmonary Rehab  Education need identified 05/23/22  Date 05/25/22  Educator [place]  Instruction Review Code 1- [employer] Understanding       Education: Flexibility, Balance, Mind/Body Relaxation: - Group verbal and visual presentation with interactive activity on the components of exercise prescription. Introduces F.I.T.T principle from ACSM for exercise prescriptions. Reviews F.I.T.T. principles of flexibility and balance exercise training including progression. Also discusses the mind body connection.  Reviews various relaxation techniques to help reduce and manage stress (i.e. Deep breathing, progressive muscle relaxation, and visualization). Balance handout provided to take home. Written material given at graduation. Flowsheet Row Cardiac Rehab from 07/20/2022 in [facility] Cardiac and Pulmonary Rehab  Date 06/08/22  Educator NT  Instruction Review Code 1- Verbalizes Understanding       Activity Barriers & Risk Stratification:  Activity Barriers & Cardiac Risk Stratification - 05/13/22 1413       Activity Barriers & Cardiac Risk Stratification   Activity Barriers None     Cardiac Risk Stratification Moderate             6 Minute Walk:  6 Minute Walk     Row Name 05/23/22 1509         6 Minute Walk   Phase Initial     Distance 1340 feet     Walk Time 6 minutes     # of Rest Breaks 0     MPH 2.54     METS 2.31     RPE 10     Perceived Dyspnea  0     VO2 Peak 8.08     Symptoms No     Resting HR 70 bpm     Resting BP 122/80     Resting Oxygen Saturation  98 %     Exercise Oxygen Saturation  during 6 min walk 97 %     Max Ex. HR 98 bpm     Max Ex. BP 138/72     2 Minute Post BP 128/72              Oxygen Initial Assessment:   Oxygen Re-Evaluation:   Oxygen Discharge (Final  Oxygen Re-Evaluation):   Initial Exercise Prescription:  Initial Exercise Prescription - 05/23/22 1500       Date of Initial Exercise RX and Referring Provider   Date 05/23/22    Referring Provider Dr. Kirk Ruths      Oxygen   Maintain Oxygen Saturation 88% or higher      NuStep   Level 2    SPM 80    Minutes 15    METs 2.31      REL-XR   Level 1    Speed 50    Minutes 15    METs 2.31      Track   Laps 35    Minutes 15    METs 2.9      Prescription Details   Frequency (times per week) 2    Duration Progress to 30 minutes of continuous aerobic without signs/symptoms of physical distress      Intensity   THRR 40-80% of Max Heartrate 100-130    Ratings of Perceived Exertion 11-13    Perceived Dyspnea 0-4      Progression   Progression Continue to progress workloads to maintain intensity without signs/symptoms of physical distress.      Resistance Training   Training Prescription Yes    Weight 3    Reps 10-15             Perform Capillary Blood Glucose checks as needed.  Exercise Prescription Changes:   Exercise Prescription Changes     Row Name 05/23/22 1500 06/01/22 1600 06/15/22 1500 06/28/22 1500 07/12/22 1400     Response to Exercise   Blood Pressure (Admit) 122/80 116/68 124/72 126/64 124/68   Blood  Pressure (Exercise) 138/72 148/66 142/62 144/68 --   Blood Pressure (Exit) 128/72 122/66 128/68 124/60 108/70   Heart Rate (Admit) 70 bpm 75 bpm 66 bpm 77 bpm 77 bpm   Heart Rate (Exercise) 109 bpm 109 bpm 102 bpm 103 bpm 87 bpm   Heart Rate (Exit) 72 bpm 80 bpm 78 bpm 77 bpm 74 bpm   Oxygen Saturation (Admit) 98 % -- -- -- --   Oxygen Saturation (Exercise) 97 % -- -- -- --   Oxygen Saturation (Exit) 98 % -- -- -- --   Rating of Perceived Exertion (Exercise) _0 Perceived Dyspnea (Exercise) 0 -- -- -- --   Symptoms _1    Comments 6 MWT results First full day of exercise -- -- --   Duration -- Continue with 30 min of aerobic exercise without signs/symptoms of physical distress. Continue with 30 min of aerobic exercise without signs/symptoms of physical distress. Continue with 30 min of aerobic exercise without signs/symptoms of physical distress. Continue with 30 min of aerobic exercise without signs/symptoms of physical distress.   Intensity -- THRR unchanged THRR unchanged THRR unchanged THRR unchanged     Progression   Progression -- Continue to progress workloads to maintain intensity without signs/symptoms of physical distress. Continue to progress workloads to maintain intensity without signs/symptoms of physical distress. Continue to progress workloads to maintain intensity without signs/symptoms of physical distress. Continue to progress workloads to maintain intensity without signs/symptoms of physical distress.   Average METs -- 2.3 3.3 2.91 2.42     Resistance Training   Training Prescription -- Yes Yes Yes Yes   Weight -- 3 lb 3 lb 2 lb 2 lb   Reps -- 10-15 10-15 10-15 10-15     Interval Training  Interval Training -- -- No No No     NuStep   Level -- 3 3 -- --   Minutes -- 15 15 -- --   METs -- 1.4 -- -- --     REL-XR   Level -- _0 Minutes -- _1 METs -- 2.1 4.8 3 2.7     Track   Laps -- 34 40 25 25   Minutes -- _2 METs -- 2.85 3.18 2.36 2.36     Oxygen   Maintain Oxygen Saturation -- -- 88% or higher 88% or higher 88% or higher    Row Name 07/26/22 1500             Response to Exercise   Blood Pressure (Admit) 116/68       Blood Pressure (Exit) 118/60       Heart Rate (Admit) 68 bpm       Heart Rate (Exercise) 90 bpm       Heart Rate (Exit) 67 bpm       Rating of Perceived Exertion (Exercise) 13       Symptoms none       Duration Continue with 30 min of aerobic exercise without signs/symptoms of physical distress.       Intensity THRR unchanged         Progression   Progression Continue to progress workloads to maintain intensity without signs/symptoms of physical distress.       Average METs 2.67         Resistance Training   Training Prescription Yes       Weight 3 lb       Reps 10-15         Interval Training   Interval Training No         REL-XR   Level 2       Minutes 15       METs 3.4         Track   Laps 25       Minutes 15       METs 2.36         Oxygen   Maintain Oxygen Saturation 88% or higher                Exercise Comments:   Exercise Goals and Review:   Exercise Goals     Row Name 05/23/22 1513             Exercise Goals   Increase Physical Activity Yes       Intervention Provide advice, education, support and counseling about physical activity/exercise needs.;Develop an individualized exercise prescription for aerobic and resistive training based on initial evaluation findings, risk stratification, comorbidities and participant's personal goals.       Expected Outcomes Short Term: Attend rehab on a regular basis to increase amount of physical activity.;Long Term: Add in home exercise to make exercise part of routine and to increase amount of physical activity.;Long Term: Exercising regularly at least 3-5 days a week.       Increase Strength and Stamina Yes       Intervention Provide advice, education, support and counseling about  physical activity/exercise needs.;Develop an individualized exercise prescription for aerobic and resistive training based on initial evaluation findings, risk stratification, comorbidities and participant's personal goals.       Expected Outcomes Short Term: Increase workloads from initial exercise prescription for resistance,  speed, and METs.;Short Term: Perform resistance training exercises routinely during rehab and add in resistance training at home;Long Term: Improve cardiorespiratory fitness, muscular endurance and strength as measured by increased METs and functional capacity (6MWT)       Able to understand and use rate of perceived exertion (RPE) scale Yes       Intervention Provide education and explanation on how to use RPE scale       Expected Outcomes Short Term: Able to use RPE daily in rehab to express subjective intensity level;Long Term:  Able to use RPE to guide intensity level when exercising independently       Able to understand and use Dyspnea scale Yes       Intervention Provide education and explanation on how to use Dyspnea scale       Expected Outcomes Short Term: Able to use Dyspnea scale daily in rehab to express subjective sense of shortness of breath during exertion;Long Term: Able to use Dyspnea scale to guide intensity level when exercising independently       Knowledge and understanding of Target Heart Rate Range (THRR) Yes       Intervention Provide education and explanation of THRR including how the numbers were predicted and where they are located for reference       Expected Outcomes Short Term: Able to state/look up THRR;Short Term: Able to use daily as guideline for intensity in rehab;Long Term: Able to use THRR to govern intensity when exercising independently       Able to check pulse independently Yes       Intervention Provide education and demonstration on how to check pulse in carotid and radial arteries.;Review the importance of being able to check your own  pulse for safety during independent exercise       Expected Outcomes Short Term: Able to explain why pulse checking is important during independent exercise;Long Term: Able to check pulse independently and accurately       Understanding of Exercise Prescription Yes       Intervention Provide education, explanation, and written materials on patient's individual exercise prescription       Expected Outcomes Short Term: Able to explain program exercise prescription;Long Term: Able to explain home exercise prescription to exercise independently                Exercise Goals Re-Evaluation :  Exercise Goals Re-Evaluation     Row Name 05/25/22 1337 06/01/22 1627 06/15/22 1533 06/28/22 1522 07/12/22 1442     Exercise Goal Re-Evaluation   Exercise Goals Review Increase Physical Activity;Able to understand and use rate of perceived exertion (RPE) scale;Knowledge and understanding of Target Heart Rate Range (THRR);Understanding of Exercise Prescription;Increase Strength and Stamina;Able to check pulse independently Increase Physical Activity;Understanding of Exercise Prescription;Increase Strength and Stamina Increase Physical Activity;Understanding of Exercise Prescription;Increase Strength and Stamina -- Increase Physical Activity;Understanding of Exercise Prescription;Increase Strength and Stamina   Comments Reviewed RPE and dyspnea scales, THR and program prescription with pt today.  Pt voiced understanding and was given a copy of goals to take home. Brena Windsor is off to a good start in rehab. She had an average overall MET level of 2.3 METs. She also was able to walk 34 laps on the track. She tolerated level 1 on the XR and level 3 on the T4 as well. We will continue to monitor her progress in the program. Kaityln Kallstrom continues to do well in rehab. She increased her overall average MET level to  3.3 METs. She also was able to walk up to 40 laps on the track. She improved to level 3 on the XR as well. We  will continue to monitor her progress in the program. Haylen Bellotti continues to do well in rehab. She did see a decrease in her METs to 2.91 METs. She did tolerate the XR machine at level 3. Her laps on the track did see a little bit of a decrease also. We will continue to monitor her progress in the program. marykathryn carboni is doing well in rehab. She did see a decrease in her overall average METs to 2.42. She increased her level on the XR to level 4. She also has consistently reached 25 laps on the track. We will continue to monitor her progress in the program.   Expected Outcomes Short: Use RPE daily to regulate intensity. Long: Follow program prescription in THR. Short: Continue to increase workloads. Long: Continue to increase strength and stamina. Short: Continue to increase laps on the track. Long: Continue to increase strength and stamina. Short: Continue to increase workloads and laps on the track. Long: Continue to increase strength and stamina. Short: Continue to increase workloads and laps on the track. Long: Continue to increase strength and stamina.    Vaughn Name 07/13/22 1344 07/26/22 1515           Exercise Goal Re-Evaluation   Exercise Goals Review Increase Physical Activity;Understanding of Exercise Prescription;Increase Strength and Stamina;Able to check pulse independently;Knowledge and understanding of Target Heart Rate Range (THRR) Increase Physical Activity;Understanding of Exercise Prescription;Increase Strength and Stamina      Comments Reviewed home exercise with pt today.  Pt plans to walk for exercise. Patient is also a member at [employer] and plans to go there when the weather gets colder or not safe to walk ouside.  Reviewed THR, pulse, RPE, sign and symptoms, pulse oximetery and when to call 911 or MD.  Also discussed weather considerations and indoor options.  Pt voiced understanding. She has a pulse ox to wear but not sure if it works and she was encouraged to bring it in to  check. Westlyn Glaza continues to come to rehab. She has been working primarily on the XR and track and staff will encourage to use other different machines. She has been at a consistent 25 laps on the track and should be encouraged to increase more over time. She is also due for her post 6MWT next week and we hope to see improvement. Will continue to monitor.      Expected Outcomes Short: Bring in pulse ox to check HR, start checking during exercise Long: Continue to exercise independently at appropriate prescription Short: Increase laps on track and improve on post 6MWT Long: Continue to increase overall MET level               Discharge Exercise Prescription (Final Exercise Prescription Changes):  Exercise Prescription Changes - 07/26/22 1500       Response to Exercise   Blood Pressure (Admit) 116/68    Blood Pressure (Exit) 118/60    Heart Rate (Admit) 68 bpm    Heart Rate (Exercise) 90 bpm    Heart Rate (Exit) 67 bpm    Rating of Perceived Exertion (Exercise) 13    Symptoms none    Duration Continue with 30 min of aerobic exercise without signs/symptoms of physical distress.    Intensity THRR unchanged      Progression   Progression Continue to progress  workloads to maintain intensity without signs/symptoms of physical distress.    Average METs 2.67      Resistance Training   Training Prescription Yes    Weight 3 lb    Reps 10-15      Interval Training   Interval Training No      REL-XR   Level 2    Minutes 15    METs 3.4      Track   Laps 25    Minutes 15    METs 2.36      Oxygen   Maintain Oxygen Saturation 88% or higher             Nutrition:  Target Goals: Understanding of nutrition guidelines, daily intake of sodium <1585m, cholesterol <2031m calories 30% from fat and 7% or less from saturated fats, daily to have 5 or more servings of fruits and vegetables.  Education: All About Nutrition: -Group instruction provided by verbal, written material,  interactive activities, discussions, models, and posters to present general guidelines for heart healthy nutrition including fat, fiber, MyPlate, the role of sodium in heart healthy nutrition, utilization of the nutrition label, and utilization of this knowledge for meal planning. Follow up email sent as well. Written material given at graduation. [facility]  Education need identified 05/23/22  Date 06/22/22  Educator M[place]ostonInstruction Review Code 1- Verbalizes Understanding       Biometrics:  Pre Biometrics - 05/23/22 1514       Pre Biometrics   Height 5' 7.75" (1.721 m)    Weight 245 lb 3.2 oz (111.2 kg)    Waist Circumference 42.25 inches    Hip Circumference 55 inches    Waist to Hip Ratio 0.77 %    BMI (Calculated) 37.55    Single Leg Stand 4.42 seconds              Nutrition Therapy Plan and Nutrition Goals:  Nutrition Therapy & Goals - 07/13/22 1359       Nutrition Therapy   RD appointment deferred Yes             Nutrition Assessments:  MEDIFICTS Score Key: ?70 Need to make dietary changes  40-70 Heart Healthy Diet ? 40 Therapeutic Level Cholesterol Diet  [facility]  Picture Your Plate Total Score on Admission 64      Picture Your Plate Scores: <4<29nhealthy dietary pattern with much room for improvement. 41-50 Dietary pattern unlikely to meet recommendations for good health and room for improvement. 51-60 More healthful dietary pattern, with some room for improvement.  >60 Healthy dietary pattern, although there may be some specific behaviors that could be improved.    Nutrition Goals Re-Evaluation:  Nutrition Goals Re-Evaluation     RoYemasseeame 07/13/22 139242           Goals   Nutrition Goal JeJavonas still declining nutrition at this time.                Nutrition Goals Discharge (Final Nutrition  Goals Re-Evaluation):  Nutrition Goals Re-Evaluation - 07/13/22 1359       Goals   Nutrition Goal JeJessabelles still declining nutrition at this time.             Psychosocial: Target Goals: Acknowledge presence or absence of significant depression and/or stress, maximize coping skills, provide positive support system.  Participant is able to verbalize types and ability to use techniques and skills needed for reducing stress and depression.   Education: Stress, Anxiety, and Depression - Group verbal and visual presentation to define topics covered.  Reviews how body is impacted by stress, anxiety, and depression.  Also discusses healthy ways to reduce stress and to treat/manage anxiety and depression.  Written material given at graduation. Flowsheet Row Cardiac Rehab from 07/20/2022 in [facility] Cardiac and Pulmonary Rehab  Date 07/20/22  Educator [place]  Instruction Review Code 1- Verbalizes Understanding       Education: Sleep Hygiene -Provides group verbal and written instruction about how sleep can affect your health.  Define sleep hygiene, discuss sleep cycles and impact of sleep habits. Review good sleep hygiene tips.    Initial Review & Psychosocial Screening:  Initial Psych Review & Screening - 05/13/22 1420       Initial Review   Current issues with Current Stress Concerns      Family Dynamics   Good Support System? Yes      Barriers   Psychosocial barriers to participate in program There are no identifiable barriers or psychosocial needs.;The patient should benefit from training in stress management and relaxation.      Screening Interventions   Interventions Encouraged to exercise;Provide feedback about the scores to participant;To provide support and resources with identified psychosocial needs    Expected Outcomes Short Term goal: Utilizing psychosocial counselor, staff and physician to assist with identification of specific Stressors or current issues interfering with  healing process. Setting desired goal for each stressor or current issue identified.;Long Term Goal: Stressors or current issues are controlled or eliminated.;Short Term goal: Identification and review with participant of any Quality of Life or Depression concerns found by scoring the questionnaire.;Long Term goal: The participant improves quality of Life and PHQ9 Scores as seen by post scores and/or verbalization of changes             Quality of Life Scores:   Quality of Life - 05/23/22 1517       Quality of Life   Select Quality of Life      Quality of Life Scores   Health/Function Pre 25.71 %    Socioeconomic Pre 27 %    Psych/Spiritual Pre 26.57 %    Family Pre 24 %    GLOBAL Pre 26.14 %            Scores of 19 and below usually indicate a poorer quality of life in these areas.  A difference of  2-3 points is a clinically meaningful difference.  A difference of 2-3 points in the total score of the Quality of Life Index has been associated with significant improvement in overall quality of life, self-image, physical symptoms, and general health in studies assessing change in quality of life.  PHQ-9: Review Flowsheet  More data exists      06/30/2022 05/23/2022 04/12/2022 03/23/2021 04/15/2020  Depression screen PHQ 2/9  Decreased Interest 0 1 0 0 0  Down, Depressed, Hopeless 0 1 0 0 0  PHQ - 2 Score 0 2 0 0 0  Altered sleeping - 0 - - 0  Tired, decreased energy - 1 - - 0  Change in appetite - 0 - - 0  Feeling bad or failure about yourself  - 0 - - 0  Trouble concentrating - 1 - - 0  Moving slowly or fidgety/restless - 0 - - 0  Suicidal thoughts - 0 - -  0  PHQ-9 Score - 4 - - 0  Difficult doing work/chores - Not difficult at all - - Not difficult at all   Interpretation of Total Score  Total Score Depression Severity:  1-4 = Minimal depression, 5-9 = Mild depression, 10-14 = Moderate depression, 15-19 = Moderately severe depression, 20-27 = Severe depression    Psychosocial Evaluation and Intervention:  Psychosocial Evaluation - 05/13/22 1436       Psychosocial Evaluation & Interventions   Interventions Encouraged to exercise with the program and follow exercise prescription;Stress management education;Relaxation education    Comments Maika Mcelveen has only been on thyroid medication until her recent stent which led to being put on blood pressure, cholesteral and blood thinner medication. She really wants to get off as much medication as she can as she doesn't like the side effects. She has a history of increased stress. She states she realized something was off in the spring when she was putting out mulch and kept getting short of breath which prompted her to seek evaluation from her MD. She feels thankful it was just a stent and not bypass like her husband (who has passed now) needed years ago. She is very interested in the education component, especially nutrition.    Expected Outcomes Short: attend cardiac rehab for education and exercise. Long: develop and maintian positive self care habits.    Continue Psychosocial Services  Follow up required by staff             Psychosocial Re-Evaluation:  Psychosocial Re-Evaluation     [place] Name 06/23/22 1411 07/13/22 1406           Psychosocial Re-Evaluation   Current issues with None Identified None Identified      Comments Patient reports no issues with their current mental states, sleep, stress, depression or anxiety. Will follow up with patient in a few weeks for any changes. Janeisha Ryle is doing well mentally. She has been in a long-term relationship for a while which had some hiccups but says they are much better now. 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 She knows to reach out if anything changes. Her sleep has been OK, sometimes she gets up in the middle of the night to use  the bathroom and will get back to sleep some nights but others it takes her hours. She is not looking for any intervention with that. She has been feeling low energy and wants to talk to her doctor about taking B12.      Expected Outcomes Short: Continue to exercise regularly to support mental health and notify staff of any changes. Long: maintain mental health and well being through teaching of rehab or prescribed medications independently. Short: Talk to doctor about B12 Long: Continue to utilize exercise for stress management and maintain positive attitude      Interventions Encouraged to attend Cardiac Rehabilitation for the exercise Encouraged to attend Cardiac Rehabilitation for the exercise      Continue Psychosocial Services  Follow up required by staff Follow up required by staff               Psychosocial Discharge (Final Psychosocial Re-Evaluation):  Psychosocial Re-Evaluation - 07/13/22 1406       Psychosocial Re-Evaluation   Current issues with None Identified    Comments Jaleah Lefevre is doing well mentally. She has been in  a long-term relationship for a while which had some hiccups but says they are much better now. 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 She knows to reach out if anything changes. Her sleep has been OK, sometimes she gets up in the middle of the night to use the bathroom and will get back to sleep some nights but others it takes her hours. She is not looking for any intervention with that. She has been feeling low energy and wants to talk to her doctor about taking B12.    Expected Outcomes Short: Talk to doctor about B12 Long: Continue to utilize exercise for stress management and maintain positive attitude    Interventions Encouraged to attend Cardiac Rehabilitation for the exercise    Continue Psychosocial Services  Follow up required by staff              Vocational Rehabilitation: Provide vocational rehab assistance to qualifying candidates.   Vocational Rehab Evaluation & Intervention:  Vocational Rehab - 05/13/22 1420       Initial Vocational Rehab Evaluation & Intervention   Assessment shows need for Vocational Rehabilitation No             Education: Education Goals: Education classes will be provided on a variety of topics geared toward better understanding of heart health and risk factor modification. Participant will state understanding/return demonstration of topics presented as noted by education test scores.  Learning Barriers/Preferences:  Learning Barriers/Preferences - 05/13/22 1420       Learning Barriers/Preferences   Learning Barriers None    Learning Preferences Individual Instruction             General Cardiac Education Topics:  AED/CPR: - Group verbal and written instruction with the use of models to demonstrate the basic use of the AED with the basic ABC's of resuscitation.   Anatomy and Cardiac Procedures: - Group verbal and visual presentation and models provide information about basic cardiac anatomy and function. Reviews the testing methods done to diagnose heart disease and the outcomes of the test results. Describes the treatment choices: Medical Management, Angioplasty, or Coronary Bypass Surgery for treating various heart conditions including Myocardial Infarction, Angina, Valve Disease, and Cardiac Arrhythmias.  Written material given at graduation. Flowsheet Row Cardiac Rehab from 07/20/2022 in [facility] Cardiac and Pulmonary Rehab  Education need identified 05/23/22  Date 06/15/22  Educator SB  Instruction Review Code 1- Verbalizes Understanding       Medication Safety: - Group verbal and visual instruction to review commonly prescribed medications for heart and lung disease. Reviews the medication, class of the drug, and side effects. Includes the steps to properly  store meds and maintain the prescription regimen.  Written material given at graduation. Flowsheet Row Cardiac Rehab from 07/20/2022 in [facility] Cardiac and Pulmonary Rehab  Date 06/29/22  Educator SB  Instruction Review Code 1- Verbalizes Understanding       Intimacy: - Group verbal instruction through game format to discuss how heart and lung disease can affect sexual intimacy. Written material given at graduation.. Flowsheet Row Cardiac Rehab from 07/20/2022 in [facility] Cardiac and Pulmonary Rehab  Education need identified 05/23/22  Date 06/01/22  Educator [facility]  Instruction Review Code 1- Verbalizes Understanding       Know Your Numbers and Heart Failure: - Group verbal and visual instruction to discuss disease risk factors for cardiac  and pulmonary disease and treatment options.  Reviews associated critical values for Overweight/Obesity, Hypertension, Cholesterol, and Diabetes.  Discusses basics of heart failure: signs/symptoms and treatments.  Introduces Heart Failure Zone chart for action plan for heart failure.  Written material given at graduation. Flowsheet Row Cardiac Rehab from 07/20/2022 in [facility] Cardiac and Pulmonary Rehab  Education need identified 05/23/22  Date 07/06/22  Educator SB  Instruction Review Code 1- Verbalizes Understanding       Infection Prevention: - Provides verbal and written material to individual with discussion of infection control including proper hand washing and proper equipment cleaning during exercise session. Flowsheet Row Cardiac Rehab from 07/20/2022 in [facility] Cardiac and Pulmonary Rehab  Date 05/23/22  Educator [facility]  Instruction Review Code 1- Verbalizes Understanding       Falls Prevention: - Provides verbal and written material to individual with discussion of falls prevention and safety. Flowsheet Row Cardiac Rehab from 07/20/2022 in [facility] Cardiac and Pulmonary Rehab  Date 05/23/22  Educator [facility]  Instruction Review Code 1- Verbalizes  Understanding       Other: -Provides group and verbal instruction on various topics (see comments)   Knowledge Questionnaire Score:  Knowledge Questionnaire Score - 05/23/22 1518       Knowledge Questionnaire Score   Pre Score 19/26             Core Components/Risk Factors/Patient Goals at Admission:  Personal Goals and Risk Factors at Admission - 05/23/22 1519       Core Components/Risk Factors/Patient Goals on Admission    Weight Management Yes    Intervention Weight Management: Develop a combined nutrition and exercise program designed to reach desired caloric intake, while maintaining appropriate intake of nutrient and fiber, sodium and fats, and appropriate energy expenditure required for the weight goal.;Weight Management: Provide education and appropriate resources to help participant work on and attain dietary goals.;Weight Management/Obesity: Establish reasonable short term and long term weight goals.;Obesity: Provide education and appropriate resources to help participant work on and attain dietary goals.    Admit Weight 245 lb 3.2 oz (111.2 kg)    Goal Weight: Short Term 240 lb (108.9 kg)    Goal Weight: Long Term 220 lb (99.8 kg)    Expected Outcomes Short Term: Continue to assess and modify interventions until short term weight is achieved;Long Term: Adherence to nutrition and physical activity/exercise program aimed toward attainment of established weight goal;Weight Loss: Understanding of general recommendations for a balanced deficit meal plan, which promotes 1-2 lb weight loss per week and includes a negative energy balance of [phone] kcal/d;Understanding recommendations for meals to include 15-35% energy as protein, 25-35% energy from fat, 35-60% energy from carbohydrates, less than 247m of dietary cholesterol, 20-35 gm of total fiber daily;Understanding of distribution of calorie intake throughout the day with the consumption of 4-5 meals/snacks    Lipids Yes     Intervention Provide education and support for participant on nutrition & aerobic/resistive exercise along with prescribed medications to achieve LDL <769m HDL >4025m   Expected Outcomes Short Term: Participant states understanding of desired cholesterol values and is compliant with medications prescribed. Participant is following exercise prescription and nutrition guidelines.;Long Term: Cholesterol controlled with medications as prescribed, with individualized exercise RX and with personalized nutrition plan. Value goals: LDL < 37m36mDL > 40 mg.             Education:Diabetes - Individual verbal and written instruction to review signs/symptoms of diabetes, desired ranges of glucose level fasting,  after meals and with exercise. Acknowledge that pre and post exercise glucose checks will be done for 3 sessions at entry of program.   Core Components/Risk Factors/Patient Goals Review:   Goals and Risk Factor Review     Row Name 06/23/22 1408 07/13/22 1357           Core Components/Risk Factors/Patient Goals Review   Personal Goals Review Weight Management/Obesity Weight Management/Obesity;Lipids      Review Rina wants to lose more weight. She does not have alot of energy and is not able to do what she wants for exercise. She is trying to get more focused on her diet and is trying to eat healthier. Shizuko Wojdyla states she has lost 10 lbs since she has started the program. She started off at 250 lb and is now down to 241 lb. Her goal is under 200 lb. She has been eating different, specifically eating less hotdogs, cheeseburgers, fries, and less processed food. She has a scale at home and weighs herself every other day. She has also been doing a lot more walking at home. She has been taking all of her medications as prescribed but has complaints of muscle and joint pain which she may think is from her Rosuvastatin. She wants to to talk to her doctor about changing off a statin tp something else.       Expected Outcomes Short: lose 5 pounds in a couple weeks. Long: reach her weight goal. Short: Talk to doctor about possible stain side effects Long: Continue to manage lifestyle risk factors               Core Components/Risk Factors/Patient Goals at Discharge (Final Review):   Goals and Risk Factor Review - 07/13/22 1357       Core Components/Risk Factors/Patient Goals Review   Personal Goals Review Weight Management/Obesity;Lipids    Review Shaterica Mcclatchy states she has lost 10 lbs since she has started the program. She started off at 250 lb and is now down to 241 lb. Her goal is under 200 lb. She has been eating different, specifically eating less hotdogs, cheeseburgers, fries, and less processed food. She has a scale at home and weighs herself every other day. She has also been doing a lot more walking at home. She has been taking all of her medications as prescribed but has complaints of muscle and joint pain which she may think is from her Rosuvastatin. She wants to to talk to her doctor about changing off a statin tp something else.    Expected Outcomes Short: Talk to doctor about possible stain side effects Long: Continue to manage lifestyle risk factors             ITP Comments:  ITP Comments     Row Name 05/13/22 1412 05/23/22 1508 05/25/22 1337 06/01/22 0753 06/29/22 0701   ITP Comments Initial telephone orientation completed. Diagnosis can be found in [facility] 7/31. EP orientation scheduled for Monday 8/28 at 1:30pm. Completed 6MWT and gym orientation. Initial ITP created and sent for review to Dr. Emily Filbert, Medical Director. First full day of exercise!  Patient was oriented to gym and equipment including functions, settings, policies, and procedures.  Patient's individual exercise prescription and treatment plan were reviewed.  All starting workloads were established based on the results of the 6 minute walk test done at initial orientation visit.  The plan for exercise  progression was also introduced and progression will be customized based on patient's performance and goals.'  30 Day review completed. Medical Director ITP review done, changes made as directed, and signed approval by Medical Director.   NEW 30 Day review completed. Medical Director ITP review done, changes made as directed, and signed approval by Medical Director.    [place] Name 07/27/22 0915           ITP Comments 30 Day review completed. Medical Director ITP review done, changes made as directed, and signed approval by Medical Director.                Comments:

## 2022-07-27 NOTE — Progress Notes (Signed)
Daily Session Note  Patient Details  Name: [name] MRN: [id number] Date of Birth: 09/30/1945 Referring Provider:   Flowsheet Row Cardiac Rehab from 05/23/2022 in [facility] Cardiac and Pulmonary Rehab  Referring Provider Dr. Kirk Ruths       Encounter Date: 07/27/2022  Check In:  Session Check In - 07/27/22 1339       Check-In   Supervising physician immediately available to respond to emergencies See telemetry face sheet for immediately available ER MD    Location [facility]-Cardiac & Pulmonary Rehab    Staff Present Alberteen Sam, MA, RCEP, CCRP, CCET;Myreon Wimer [facility], RN Odelia Gage, RN, ADN    Virtual Visit No    Medication changes reported     No    Fall or balance concerns reported    No    Warm-up and Cool-down Performed on first and last piece of equipment    Resistance Training Performed Yes    VAD Patient? No    PAD/SET Patient? No      Pain Assessment   Currently in Pain? No/denies                Social History   Tobacco Use  Smoking Status Never  Smokeless Tobacco Never    Goals Met:  Independence with exercise equipment Exercise tolerated well No report of concerns or symptoms today Strength training completed today  Goals Unmet:  Not Applicable  Comments: Pt able to follow exercise prescription today without complaint.  Will continue to monitor for progression.    Dr. Emily Filbert is Medical Director for [place].  Dr. Ottie Glazier is Medical Director for [facility] Pulmonary Rehabilitation.

## 2022-07-28 ENCOUNTER — Encounter: Payer: PPO | Admitting: *Deleted

## 2022-07-28 VITALS — Wt 244.8 lb

## 2022-07-28 DIAGNOSIS — Z955 Presence of coronary angioplasty implant and graft: Secondary | ICD-10-CM | POA: Diagnosis not present

## 2022-07-28 NOTE — Progress Notes (Signed)
Daily Session Note  Patient Details  Name: [name] MRN: [id number] Date of Birth: 08-14-1946 Referring Provider:   Flowsheet Row Cardiac Rehab from 05/23/2022 in [facility] Cardiac and Pulmonary Rehab  Referring Provider Dr. Kirk Ruths       Encounter Date: 07/28/2022  Check In:  Session Check In - 07/28/22 1356       Check-In   Supervising physician immediately available to respond to emergencies See telemetry face sheet for immediately available ER MD    Location [facility]-Cardiac & Pulmonary Rehab    Staff Present Antionette Fairy, BS, Exercise Physiologist;Joseph Rosebud Poles, RN, [place]    Virtual Visit No    Medication changes reported     Yes    Comments discontinued statin    Fall or balance concerns reported    No    Warm-up and Cool-down Performed on first and last piece of equipment    Resistance Training Performed Yes    VAD Patient? No    PAD/SET Patient? No      Pain Assessment   Currently in Pain? No/denies                Social History   Tobacco Use  Smoking Status Never  Smokeless Tobacco Never    Goals Met:  Independence with exercise equipment Exercise tolerated well No report of concerns or symptoms today Strength training completed today  Goals Unmet:  Not Applicable  Comments: Pt able to follow exercise prescription today without complaint.  Will continue to monitor for progression.    Dr. Emily Filbert is Medical Director for [facility].  Dr. Ottie Glazier is Medical Director for [facility] Pulmonary Rehabilitation.

## 2022-07-28 NOTE — Patient Instructions (Signed)
Discharge Patient Instructions  Patient Details  Name: [name] MRN: [id number] Date of Birth: 05/18/1946 Referring Provider:  Binnie Rail, MD   Number of Visits: 69  Reason for Discharge:  Patient reached a stable level of exercise. Patient independent in their exercise. Patient has met program and personal goals.  Smoking History:  Social History   Tobacco Use  Smoking Status Never  Smokeless Tobacco Never    Diagnosis:  Status post coronary artery stent placement  Initial Exercise Prescription:  Initial Exercise Prescription - 05/23/22 1500       Date of Initial Exercise RX and Referring Provider   Date 05/23/22    Referring Provider Dr. Kirk Ruths      Oxygen   Maintain Oxygen Saturation 88% or higher      NuStep   Level 2    SPM 80    Minutes 15    METs 2.31      REL-XR   Level 1    Speed 50    Minutes 15    METs 2.31      Track   Laps 35    Minutes 15    METs 2.9      Prescription Details   Frequency (times per week) 2    Duration Progress to 30 minutes of continuous aerobic without signs/symptoms of physical distress      Intensity   THRR 40-80% of Max Heartrate 100-130    Ratings of Perceived Exertion 11-13    Perceived Dyspnea 0-4      Progression   Progression Continue to progress workloads to maintain intensity without signs/symptoms of physical distress.      Resistance Training   Training Prescription Yes    Weight 3    Reps 10-15             Discharge Exercise Prescription (Final Exercise Prescription Changes):  Exercise Prescription Changes - 07/26/22 1500       Response to Exercise   Blood Pressure (Admit) 116/68    Blood Pressure (Exit) 118/60    Heart Rate (Admit) 68 bpm    Heart Rate (Exercise) 90 bpm    Heart Rate (Exit) 67 bpm    Rating of Perceived Exertion (Exercise) 13    Symptoms none    Duration Continue with 30 min of aerobic exercise without signs/symptoms of physical distress.     Intensity THRR unchanged      Progression   Progression Continue to progress workloads to maintain intensity without signs/symptoms of physical distress.    Average METs 2.67      Resistance Training   Training Prescription Yes    Weight 3 lb    Reps 10-15      Interval Training   Interval Training No      REL-XR   Level 2    Minutes 15    METs 3.4      Track   Laps 25    Minutes 15    METs 2.36      Oxygen   Maintain Oxygen Saturation 88% or higher             Functional Capacity:  6 Minute Walk     Row Name 05/23/22 1509 07/28/22 1412       6 Minute Walk   Phase Initial Discharge    Distance 1340 feet 1435 feet    Distance % Change -- 7 %    Distance Feet Change -- 95 ft  Walk Time 6 minutes 6 minutes    # of Rest Breaks 0 0    MPH 2.54 2.72    METS 2.31 2.69    RPE 10 13    Perceived Dyspnea  0 2    VO2 Peak 8.08 9.43    Symptoms No Yes (comment)    Comments -- SOB    Resting HR 70 bpm 70 bpm    Resting BP 122/80 120/68    Resting Oxygen Saturation  98 % 96 %    Exercise Oxygen Saturation  during 6 min walk 97 % 95 %    Max Ex. HR 98 bpm 115 bpm    Max Ex. BP 138/72 156/78    2 Minute Post BP 128/72 --            Nutrition & Weight - Outcomes:  Pre Biometrics - 05/23/22 1514       Pre Biometrics   Height 5' 7.75" (1.721 m)    Weight 245 lb 3.2 oz (111.2 kg)    Waist Circumference 42.25 inches    Hip Circumference 55 inches    Waist to Hip Ratio 0.77 %    BMI (Calculated) 37.55    Single Leg Stand 4.42 seconds             Post Biometrics - 07/28/22 1414        Post  Biometrics   Weight 244 lb 12.8 oz (111 kg)    Single Leg Stand 5.92 seconds             Nutrition:  Nutrition Therapy & Goals - 07/13/22 1359       Nutrition Therapy   RD appointment deferred Yes

## 2022-08-03 ENCOUNTER — Encounter: Payer: PPO | Admitting: *Deleted

## 2022-08-03 DIAGNOSIS — Z955 Presence of coronary angioplasty implant and graft: Secondary | ICD-10-CM | POA: Diagnosis not present

## 2022-08-03 NOTE — Progress Notes (Signed)
Daily Session Note  Patient Details  Name: Leeanna Slaby MRN: [id number] Date of Birth: December 07, 1945 Referring Provider:   Flowsheet Row Cardiac Rehab from 05/23/2022 in [facility] Cardiac and Pulmonary Rehab  Referring Provider Dr. Kirk Ruths       Encounter Date: 08/03/2022  Check In:  Session Check In - 08/03/22 1353       Check-In   Supervising physician immediately available to respond to emergencies See telemetry face sheet for immediately available ER MD    Location [facility]-Cardiac & Pulmonary Rehab    Staff Present Alberteen Sam, MA, RCEP, CCRP, CCET;Jaimie Pippins [place], RN Odelia Gage, RN, ADN    Virtual Visit No    Medication changes reported     No    Fall or balance concerns reported    No    Warm-up and Cool-down Performed on first and last piece of equipment    Resistance Training Performed Yes    VAD Patient? No    PAD/SET Patient? No      Pain Assessment   Currently in Pain? No/denies                Social History   Tobacco Use  Smoking Status Never  Smokeless Tobacco Never    Goals Met:  Independence with exercise equipment Exercise tolerated well No report of concerns or symptoms today Strength training completed today  Goals Unmet:  Not Applicable  Comments: Pt able to follow exercise prescription today without complaint.  Will continue to monitor for progression.    Dr. Emily Filbert is Medical Director for [place].  Dr. Ottie Glazier is Medical Director for [facility] Pulmonary Rehabilitation.

## 2022-08-04 ENCOUNTER — Encounter: Payer: PPO | Admitting: *Deleted

## 2022-08-04 DIAGNOSIS — Z955 Presence of coronary angioplasty implant and graft: Secondary | ICD-10-CM

## 2022-08-04 NOTE — Progress Notes (Deleted)
Cardiac Individual Treatment Plan  Patient Details  Name: Shaana Acocella MRN: [id number] Date of Birth: 1946-08-16 Referring Provider:   Flowsheet Row Cardiac Rehab from 05/23/2022 in [facility] Cardiac and Pulmonary Rehab  Referring Provider Dr. Kirk Ruths       Initial Encounter Date:  Flowsheet Row Cardiac Rehab from 05/23/2022 in [facility] Cardiac and Pulmonary Rehab  Date 05/23/22       Visit Diagnosis: Status post coronary artery stent placement  Patient's Home Medications on Admission:  Current Outpatient Medications:    aspirin 81 MG tablet, Take 81 mg by mouth daily., Disp: , Rfl:    cyclobenzaprine (FLEXERIL) 5 MG tablet, Take 1-2 tablets (5-10 mg total) by mouth 3 (three) times daily as needed for muscle spasms. (Patient not taking: Reported on 07/26/2022), Disp: 30 tablet, Rfl: 0   ezetimibe (ZETIA) 10 MG tablet, Take 1 tablet (10 mg total) by mouth daily., Disp: 90 tablet, Rfl: 3   levothyroxine (SYNTHROID) 50 MCG tablet, TAKE 1 TABLET BY MOUTH EVERY DAY, Disp: 90 tablet, Rfl: 3   metoprolol succinate (TOPROL XL) 25 MG 24 hr tablet, Take 1 tablet (25 mg total) by mouth daily., Disp: 90 tablet, Rfl: 3   omeprazole (PRILOSEC) 20 MG capsule, Take 1 capsule (20 mg total) by mouth daily as needed. (Patient taking differently: Take 20 mg by mouth daily.), Disp: 90 capsule, Rfl: 3   rosuvastatin (CRESTOR) 40 MG tablet, Take 1 tablet (40 mg total) by mouth daily., Disp: 90 tablet, Rfl: 3   ticagrelor (BRILINTA) 90 MG TABS tablet, Take 1 tablet (90 mg total) by mouth 2 (two) times daily., Disp: 60 tablet, Rfl: 11  Past Medical History: Past Medical History:  Diagnosis Date   Anxiety and depression 09/11/2016   Borderline diabetes mellitus    Chest pain    Elevated TSH 06/23/2017   GERD (gastroesophageal reflux disease)    Grief reaction    Hyperlipidemia    Palpitations    Personal history of urinary calculi    Prediabetes 06/21/2016   Squamous cell skin cancer     bridge of nose ( Dr Tonia Brooms)   Subclinical hypothyroidism 05/17/2011   Venous insufficiency of right leg 06/23/2017   Follows with vascular   Weakness     Tobacco Use: Social History   Tobacco Use  Smoking Status Never  Smokeless Tobacco Never    Labs: Review Flowsheet  More data exists      Latest Ref Rng & Units 01/01/2020 10/21/2020 04/16/2021 01/31/2022 05/25/2022  Labs for ITP Cardiac and Pulmonary Rehab  Cholestrol 100 - 199 mg/dL - 208  221  219  120   LDL (calc) 0 - 99 mg/dL - 125  135  136  50   HDL-C >39 mg/dL - 64.60  65.00  64.00  56   Trlycerides 0 - 149 mg/dL - 93.0  102.0  92.0  68   Hemoglobin A1c 4.6 - 6.5 % 5.6  5.8  5.8  5.8  -     Exercise Target Goals: Exercise Program Goal: Individual exercise prescription set using results from initial 6 min walk test and THRR while considering  patient's activity barriers and safety.   Exercise Prescription Goal: Initial exercise prescription builds to 30-45 minutes a day of aerobic activity, 2-3 days per week.  Home exercise guidelines will be given to patient during program as part of exercise prescription that the participant will acknowledge.   Education: Aerobic Exercise: - Group verbal and visual presentation on  the components of exercise prescription. Introduces F.I.T.T principle from ACSM for exercise prescriptions.  Reviews F.I.T.T. principles of aerobic exercise including progression. Written material given at graduation. Flowsheet Row Cardiac Rehab from 08/03/2022 in [facility] Cardiac and Pulmonary Rehab  Date 08/03/22  Educator [facility]  Instruction Review Code 1- Verbalizes Understanding       Education: Resistance Exercise: - Group verbal and visual presentation on the components of exercise prescription. Introduces F.I.T.T principle from ACSM for exercise prescriptions  Reviews F.I.T.T. principles of resistance exercise including progression. Written material given at graduation. Flowsheet Row Cardiac Rehab from  08/03/2022 in [facility] Cardiac and Pulmonary Rehab  Date 06/08/22  Educator NT  Instruction Review Code 1- Verbalizes Understanding        Education: Exercise & Equipment Safety: - Individual verbal instruction and demonstration of equipment use and safety with use of the equipment. Flowsheet Row Cardiac Rehab from 08/03/2022 in [facility] Cardiac and Pulmonary Rehab  Date 05/23/22  Educator [facility]  Instruction Review Code 1- Verbalizes Understanding       Education: Exercise Physiology & General Exercise Guidelines: - Group verbal and written instruction with models to review the exercise physiology of the cardiovascular system and associated critical values. Provides general exercise guidelines with specific guidelines to those with heart or lung disease.  Flowsheet Row Cardiac Rehab from 08/03/2022 in [facility] Cardiac and Pulmonary Rehab  Education need identified 05/23/22  Date 07/27/22  Educator [place]  Instruction Review Code 1- Verbalizes Understanding       Education: Flexibility, Balance, Mind/Body Relaxation: - Group verbal and visual presentation with interactive activity on the components of exercise prescription. Introduces F.I.T.T principle from ACSM for exercise prescriptions. Reviews F.I.T.T. principles of flexibility and balance exercise training including progression. Also discusses the mind body connection.  Reviews various relaxation techniques to help reduce and manage stress (i.e. Deep breathing, progressive muscle relaxation, and visualization). Balance handout provided to take home. Written material given at graduation. Flowsheet Row Cardiac Rehab from 08/03/2022 in [facility] Cardiac and Pulmonary Rehab  Date 06/08/22  Educator NT  Instruction Review Code 1- Verbalizes Understanding       Activity Barriers & Risk Stratification:  Activity Barriers & Cardiac Risk Stratification - 05/13/22 1413       Activity Barriers & Cardiac Risk Stratification   Activity Barriers None     Cardiac Risk Stratification Moderate             6 Minute Walk:  6 Minute Walk     Row Name 05/23/22 1509 07/28/22 1412       6 Minute Walk   Phase Initial Discharge    Distance 1340 feet 1435 feet    Distance % Change -- 7 %    Distance Feet Change -- 95 ft    Walk Time 6 minutes 6 minutes    # of Rest Breaks 0 0    MPH 2.54 2.72    METS 2.31 2.69    RPE 10 13    Perceived Dyspnea  0 2    VO2 Peak 8.08 9.43    Symptoms No Yes (comment)    Comments -- SOB    Resting HR 70 bpm 70 bpm    Resting BP 122/80 120/68    Resting Oxygen Saturation  98 % 96 %    Exercise Oxygen Saturation  during 6 min walk 97 % 95 %    Max Ex. HR 98 bpm 115 bpm    Max Ex. BP 138/72 156/78  2 Minute Post BP 128/72 --             Oxygen Initial Assessment:   Oxygen Re-Evaluation:   Oxygen Discharge (Final Oxygen Re-Evaluation):   Initial Exercise Prescription:  Initial Exercise Prescription - 05/23/22 1500       Date of Initial Exercise RX and Referring Provider   Date 05/23/22    Referring Provider Dr. Kirk Ruths      Oxygen   Maintain Oxygen Saturation 88% or higher      NuStep   Level 2    SPM 80    Minutes 15    METs 2.31      REL-XR   Level 1    Speed 50    Minutes 15    METs 2.31      Track   Laps 35    Minutes 15    METs 2.9      Prescription Details   Frequency (times per week) 2    Duration Progress to 30 minutes of continuous aerobic without signs/symptoms of physical distress      Intensity   THRR 40-80% of Max Heartrate 100-130    Ratings of Perceived Exertion 11-13    Perceived Dyspnea 0-4      Progression   Progression Continue to progress workloads to maintain intensity without signs/symptoms of physical distress.      Resistance Training   Training Prescription Yes    Weight 3    Reps 10-15             Perform Capillary Blood Glucose checks as needed.  Exercise Prescription Changes:   Exercise Prescription Changes      Row Name 05/23/22 1500 06/01/22 1600 06/15/22 1500 06/28/22 1500 07/12/22 1400     Response to Exercise   Blood Pressure (Admit) 122/80 116/68 124/72 126/64 124/68   Blood Pressure (Exercise) 138/72 148/66 142/62 144/68 --   Blood Pressure (Exit) 128/72 122/66 128/68 124/60 108/70   Heart Rate (Admit) 70 bpm 75 bpm 66 bpm 77 bpm 77 bpm   Heart Rate (Exercise) 109 bpm 109 bpm 102 bpm 103 bpm 87 bpm   Heart Rate (Exit) 72 bpm 80 bpm 78 bpm 77 bpm 74 bpm   Oxygen Saturation (Admit) 98 % -- -- -- --   Oxygen Saturation (Exercise) 97 % -- -- -- --   Oxygen Saturation (Exit) 98 % -- -- -- --   Rating of Perceived Exertion (Exercise) _0 Perceived Dyspnea (Exercise) 0 -- -- -- --   Symptoms _1    Comments 6 MWT results First full day of exercise -- -- --   Duration -- Continue with 30 min of aerobic exercise without signs/symptoms of physical distress. Continue with 30 min of aerobic exercise without signs/symptoms of physical distress. Continue with 30 min of aerobic exercise without signs/symptoms of physical distress. Continue with 30 min of aerobic exercise without signs/symptoms of physical distress.   Intensity -- THRR unchanged THRR unchanged THRR unchanged THRR unchanged     Progression   Progression -- Continue to progress workloads to maintain intensity without signs/symptoms of physical distress. Continue to progress workloads to maintain intensity without signs/symptoms of physical distress. Continue to progress workloads to maintain intensity without signs/symptoms of physical distress. Continue to progress workloads to maintain intensity without signs/symptoms of physical distress.   Average METs -- 2.3 3.3 2.91 2.42     Resistance Training   Training Prescription --  Yes Yes Yes Yes   Weight -- 3 lb 3 lb 2 lb 2 lb   Reps -- 10-15 10-15 10-15 10-15     Interval Training   Interval Training -- -- No No No     NuStep   Level -- 3 3 -- --    Minutes -- 15 15 -- --   METs -- 1.4 -- -- --     REL-XR   Level -- _0 Minutes -- _1 METs -- 2.1 4.8 3 2.7     Track   Laps -- 34 40 25 25   Minutes -- _2 METs -- 2.85 3.18 2.36 2.36     Oxygen   Maintain Oxygen Saturation -- -- 88% or higher 88% or higher 88% or higher    Row Name 07/26/22 1500             Response to Exercise   Blood Pressure (Admit) 116/68       Blood Pressure (Exit) 118/60       Heart Rate (Admit) 68 bpm       Heart Rate (Exercise) 90 bpm       Heart Rate (Exit) 67 bpm       Rating of Perceived Exertion (Exercise) 13       Symptoms none       Duration Continue with 30 min of aerobic exercise without signs/symptoms of physical distress.       Intensity THRR unchanged         Progression   Progression Continue to progress workloads to maintain intensity without signs/symptoms of physical distress.       Average METs 2.67         Resistance Training   Training Prescription Yes       Weight 3 lb       Reps 10-15         Interval Training   Interval Training No         REL-XR   Level 2       Minutes 15       METs 3.4         Track   Laps 25       Minutes 15       METs 2.36         Oxygen   Maintain Oxygen Saturation 88% or higher                Exercise Comments:   Exercise Goals and Review:   Exercise Goals     Row Name 05/23/22 1513             Exercise Goals   Increase Physical Activity Yes       Intervention Provide advice, education, support and counseling about physical activity/exercise needs.;Develop an individualized exercise prescription for aerobic and resistive training based on initial evaluation findings, risk stratification, comorbidities and participant's personal goals.       Expected Outcomes Short Term: Attend rehab on a regular basis to increase amount of physical activity.;Long Term: Add in home exercise to make exercise part of routine and to increase amount of physical  activity.;Long Term: Exercising regularly at least 3-5 days a week.       Increase Strength and Stamina Yes       Intervention Provide advice, education, support and counseling about physical activity/exercise needs.;Develop an individualized exercise prescription for aerobic and  resistive training based on initial evaluation findings, risk stratification, comorbidities and participant's personal goals.       Expected Outcomes Short Term: Increase workloads from initial exercise prescription for resistance, speed, and METs.;Short Term: Perform resistance training exercises routinely during rehab and add in resistance training at home;Long Term: Improve cardiorespiratory fitness, muscular endurance and strength as measured by increased METs and functional capacity (6MWT)       Able to understand and use rate of perceived exertion (RPE) scale Yes       Intervention Provide education and explanation on how to use RPE scale       Expected Outcomes Short Term: Able to use RPE daily in rehab to express subjective intensity level;Long Term:  Able to use RPE to guide intensity level when exercising independently       Able to understand and use Dyspnea scale Yes       Intervention Provide education and explanation on how to use Dyspnea scale       Expected Outcomes Short Term: Able to use Dyspnea scale daily in rehab to express subjective sense of shortness of breath during exertion;Long Term: Able to use Dyspnea scale to guide intensity level when exercising independently       Knowledge and understanding of Target Heart Rate Range (THRR) Yes       Intervention Provide education and explanation of THRR including how the numbers were predicted and where they are located for reference       Expected Outcomes Short Term: Able to state/look up THRR;Short Term: Able to use daily as guideline for intensity in rehab;Long Term: Able to use THRR to govern intensity when exercising independently       Able to check  pulse independently Yes       Intervention Provide education and demonstration on how to check pulse in carotid and radial arteries.;Review the importance of being able to check your own pulse for safety during independent exercise       Expected Outcomes Short Term: Able to explain why pulse checking is important during independent exercise;Long Term: Able to check pulse independently and accurately       Understanding of Exercise Prescription Yes       Intervention Provide education, explanation, and written materials on patient's individual exercise prescription       Expected Outcomes Short Term: Able to explain program exercise prescription;Long Term: Able to explain home exercise prescription to exercise independently                Exercise Goals Re-Evaluation :  Exercise Goals Re-Evaluation     Row Name 05/25/22 1337 06/01/22 1627 06/15/22 1533 06/28/22 1522 07/12/22 1442     Exercise Goal Re-Evaluation   Exercise Goals Review Increase Physical Activity;Able to understand and use rate of perceived exertion (RPE) scale;Knowledge and understanding of Target Heart Rate Range (THRR);Understanding of Exercise Prescription;Increase Strength and Stamina;Able to check pulse independently Increase Physical Activity;Understanding of Exercise Prescription;Increase Strength and Stamina Increase Physical Activity;Understanding of Exercise Prescription;Increase Strength and Stamina -- Increase Physical Activity;Understanding of Exercise Prescription;Increase Strength and Stamina   Comments Reviewed RPE and dyspnea scales, THR and program prescription with pt today.  Pt voiced understanding and was given a copy of goals to take home. Danah Reinecke is off to a good start in rehab. She had an average overall MET level of 2.3 METs. She also was able to walk 34 laps on the track. She tolerated level 1 on the XR and level  3 on the T4 as well. We will continue to monitor her progress in the program. Demeisha Geraghty  continues to do well in rehab. She increased her overall average MET level to 3.3 METs. She also was able to walk up to 40 laps on the track. She improved to level 3 on the XR as well. We will continue to monitor her progress in the program. Brunetta Newingham continues to do well in rehab. She did see a decrease in her METs to 2.91 METs. She did tolerate the XR machine at level 3. Her laps on the track did see a little bit of a decrease also. We will continue to monitor her progress in the program. leyna vanderkolk is doing well in rehab. She did see a decrease in her overall average METs to 2.42. She increased her level on the XR to level 4. She also has consistently reached 25 laps on the track. We will continue to monitor her progress in the program.   Expected Outcomes Short: Use RPE daily to regulate intensity. Long: Follow program prescription in THR. Short: Continue to increase workloads. Long: Continue to increase strength and stamina. Short: Continue to increase laps on the track. Long: Continue to increase strength and stamina. Short: Continue to increase workloads and laps on the track. Long: Continue to increase strength and stamina. Short: Continue to increase workloads and laps on the track. Long: Continue to increase strength and stamina.    [place] Name 07/13/22 1344 07/26/22 1515 08/04/22 1406         Exercise Goal Re-Evaluation   Exercise Goals Review Increase Physical Activity;Understanding of Exercise Prescription;Increase Strength and Stamina;Able to check pulse independently;Knowledge and understanding of Target Heart Rate Range (THRR) Increase Physical Activity;Understanding of Exercise Prescription;Increase Strength and Stamina Increase Physical Activity;Understanding of Exercise Prescription;Increase Strength and Stamina     Comments Reviewed home exercise with pt today.  Pt plans to walk for exercise. Patient is also a member at [employer] and plans to go there when the weather gets colder or  not safe to walk ouside.  Reviewed THR, pulse, RPE, sign and symptoms, pulse oximetery and when to call 911 or MD.  Also discussed weather considerations and indoor options.  Pt voiced understanding. She has a pulse ox to wear but not sure if it works and she was encouraged to bring it in to check. Arti Trang continues to come to rehab. She has been working primarily on the XR and track and staff will encourage to use other different machines. She has been at a consistent 25 laps on the track and should be encouraged to increase more over time. She is also due for her post 6MWT next week and we hope to see improvement. Will continue to monitor. Tenita Cue is doing well. She graduates next week.  She plans to continue to exercise by walking at home and going back to [employer]. She feels that she has more stamina.     Expected Outcomes Short: Bring in pulse ox to check HR, start checking during exercise Long: Continue to exercise independently at appropriate prescription Short: Increase laps on track and improve on post 6MWT Long: Continue to increase overall MET level Continue to exercise indpendently              Discharge Exercise Prescription (Final Exercise Prescription Changes):  Exercise Prescription Changes - 07/26/22 1500       Response to Exercise   Blood Pressure (Admit) 116/68    Blood  Pressure (Exit) 118/60    Heart Rate (Admit) 68 bpm    Heart Rate (Exercise) 90 bpm    Heart Rate (Exit) 67 bpm    Rating of Perceived Exertion (Exercise) 13    Symptoms none    Duration Continue with 30 min of aerobic exercise without signs/symptoms of physical distress.    Intensity THRR unchanged      Progression   Progression Continue to progress workloads to maintain intensity without signs/symptoms of physical distress.    Average METs 2.67      Resistance Training   Training Prescription Yes    Weight 3 lb    Reps 10-15      Interval Training   Interval Training No      REL-XR    Level 2    Minutes 15    METs 3.4      Track   Laps 25    Minutes 15    METs 2.36      Oxygen   Maintain Oxygen Saturation 88% or higher             Nutrition:  Target Goals: Understanding of nutrition guidelines, daily intake of sodium <1584m, cholesterol <2028m calories 30% from fat and 7% or less from saturated fats, daily to have 5 or more servings of fruits and vegetables.  Education: All About Nutrition: -Group instruction provided by verbal, written material, interactive activities, discussions, models, and posters to present general guidelines for heart healthy nutrition including fat, fiber, MyPlate, the role of sodium in heart healthy nutrition, utilization of the nutrition label, and utilization of this knowledge for meal planning. Follow up email sent as well. Written material given at graduation. [facility]  Education need identified 05/23/22  Date 06/22/22  Educator [place] Code 1- Verbalizes Understanding       Biometrics:  Pre Biometrics - 05/23/22 1514       Pre Biometrics   Height 5' 7.75" (1.721 m)    Weight 245 lb 3.2 oz (111.2 kg)    Waist Circumference 42.25 inches    Hip Circumference 55 inches    Waist to Hip Ratio 0.77 %    BMI (Calculated) 37.55    Single Leg Stand 4.42 seconds             Post Biometrics - 07/28/22 1414        Post  Biometrics   Weight 244 lb 12.8 oz (111 kg)    Single Leg Stand 5.92 seconds             Nutrition Therapy Plan and Nutrition Goals:  Nutrition Therapy & Goals - 07/13/22 1359       Nutrition Therapy   RD appointment deferred Yes             Nutrition Assessments:  MEDIFICTS Score Key: ?70 Need to make dietary changes  40-70 Heart Healthy Diet ? 40 Therapeutic Level Cholesterol Diet  [facility]  Picture Your Plate Total Score  on Admission 64      Picture Your Plate Scores: <4<79nhealthy dietary pattern with much room for improvement. 41-50 Dietary pattern unlikely to meet recommendations for good health and room for improvement. 51-60 More healthful dietary pattern, with some room for improvement.  >60 Healthy dietary pattern, although there may be some specific behaviors that could be improved.    Nutrition  Goals Re-Evaluation:  Nutrition Goals Re-Evaluation     [place] Name 07/13/22 3354             Goals   Nutrition Goal Aicia is still declining nutrition at this time.                Nutrition Goals Discharge (Final Nutrition Goals Re-Evaluation):  Nutrition Goals Re-Evaluation - 07/13/22 1359       Goals   Nutrition Goal Lovena is still declining nutrition at this time.             Psychosocial: Target Goals: Acknowledge presence or absence of significant depression and/or stress, maximize coping skills, provide positive support system. Participant is able to verbalize types and ability to use techniques and skills needed for reducing stress and depression.   Education: Stress, Anxiety, and Depression - Group verbal and visual presentation to define topics covered.  Reviews how body is impacted by stress, anxiety, and depression.  Also discusses healthy ways to reduce stress and to treat/manage anxiety and depression.  Written material given at graduation. Flowsheet Row Cardiac Rehab from 08/03/2022 in [facility] Cardiac and Pulmonary Rehab  Date 07/20/22  Educator [place]  Instruction Review Code 1- Verbalizes Understanding       Education: Sleep Hygiene -Provides group verbal and written instruction about how sleep can affect your health.  Define sleep hygiene, discuss sleep cycles and impact of sleep habits. Review good sleep hygiene tips.    Initial Review & Psychosocial Screening:  Initial Psych Review & Screening - 05/13/22 1420       Initial Review   Current issues with Current  Stress Concerns      Family Dynamics   Good Support System? Yes      Barriers   Psychosocial barriers to participate in program There are no identifiable barriers or psychosocial needs.;The patient should benefit from training in stress management and relaxation.      Screening Interventions   Interventions Encouraged to exercise;Provide feedback about the scores to participant;To provide support and resources with identified psychosocial needs    Expected Outcomes Short Term goal: Utilizing psychosocial counselor, staff and physician to assist with identification of specific Stressors or current issues interfering with healing process. Setting desired goal for each stressor or current issue identified.;Long Term Goal: Stressors or current issues are controlled or eliminated.;Short Term goal: Identification and review with participant of any Quality of Life or Depression concerns found by scoring the questionnaire.;Long Term goal: The participant improves quality of Life and PHQ9 Scores as seen by post scores and/or verbalization of changes             Quality of Life Scores:   Quality of Life - 05/23/22 1517       Quality of Life   Select Quality of Life      Quality of Life Scores   Health/Function Pre 25.71 %    Socioeconomic Pre 27 %    Psych/Spiritual Pre 26.57 %    Family Pre 24 %    GLOBAL Pre 26.14 %            Scores of 19 and below usually indicate a poorer quality of life in these areas.  A difference of  2-3 points is a clinically meaningful difference.  A difference of 2-3 points in the total score of the Quality of Life Index has been associated with significant improvement in overall quality of life, self-image, physical symptoms, and general health in studies assessing change  in quality of life.  PHQ-9: Review Flowsheet  More data exists      06/30/2022 05/23/2022 04/12/2022 03/23/2021 04/15/2020  Depression screen PHQ 2/9  Decreased Interest 0 1 0 0 0  Down,  Depressed, Hopeless 0 1 0 0 0  PHQ - 2 Score 0 2 0 0 0  Altered sleeping - 0 - - 0  Tired, decreased energy - 1 - - 0  Change in appetite - 0 - - 0  Feeling bad or failure about yourself  - 0 - - 0  Trouble concentrating - 1 - - 0  Moving slowly or fidgety/restless - 0 - - 0  Suicidal thoughts - 0 - - 0  PHQ-9 Score - 4 - - 0  Difficult doing work/chores - Not difficult at all - - Not difficult at all   Interpretation of Total Score  Total Score Depression Severity:  1-4 = Minimal depression, 5-9 = Mild depression, 10-14 = Moderate depression, 15-19 = Moderately severe depression, 20-27 = Severe depression   Psychosocial Evaluation and Intervention:  Psychosocial Evaluation - 05/13/22 1436       Psychosocial Evaluation & Interventions   Interventions Encouraged to exercise with the program and follow exercise prescription;Stress management education;Relaxation education    Comments Aleshia Cartelli has only been on thyroid medication until her recent stent which led to being put on blood pressure, cholesteral and blood thinner medication. She really wants to get off as much medication as she can as she doesn't like the side effects. She has a history of increased stress. She states she realized something was off in the spring when she was putting out mulch and kept getting short of breath which prompted her to seek evaluation from her MD. She feels thankful it was just a stent and not bypass like her husband (who has passed now) needed years ago. She is very interested in the education component, especially nutrition.    Expected Outcomes Short: attend cardiac rehab for education and exercise. Long: develop and maintian positive self care habits.    Continue Psychosocial Services  Follow up required by staff             Psychosocial Re-Evaluation:  Psychosocial Re-Evaluation     [place] Name 06/23/22 1411 07/13/22 1406 08/04/22 1414         Psychosocial Re-Evaluation   Current issues  with None Identified None Identified None Identified     Comments Patient reports no issues with their current mental states, sleep, stress, depression or anxiety. Will follow up with patient in a few weeks for any changes. Letetia Romanello is doing well mentally. She has been in a long-term relationship for a while which had some hiccups but says they are much better now. 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 She knows to reach out if anything changes. Her sleep has been OK, sometimes she gets up in the middle of the night to use the bathroom and will get back to sleep some nights but others it takes her hours. She is not looking for any intervention with that. She has been feeling low energy and wants to talk to her doctor about taking B12. Jamielyn Petrucci has done well in rehab.  She has enjoyed the classes and her classmates and staff.  She learned a lot in education classes too.  Expected Outcomes Short: Continue to exercise regularly to support mental health and notify staff of any changes. Long: maintain mental health and well being through teaching of rehab or prescribed medications independently. Short: Talk to doctor about B12 Long: Continue to utilize exercise for stress management and maintain positive attitude Continue to exercise for mental boost and stay positive     Interventions Encouraged to attend Cardiac Rehabilitation for the exercise Encouraged to attend Cardiac Rehabilitation for the exercise Encouraged to attend Cardiac Rehabilitation for the exercise     Continue Psychosocial Services  Follow up required by staff Follow up required by staff --              Psychosocial Discharge (Final Psychosocial Re-Evaluation):  Psychosocial Re-Evaluation - 08/04/22 1414       Psychosocial Re-Evaluation   Current issues with None Identified    Comments Casara Perrier has done  well in rehab.  She has enjoyed the classes and her classmates and staff.  She learned a lot in education classes too.    Expected Outcomes Continue to exercise for mental boost and stay positive    Interventions Encouraged to attend Cardiac Rehabilitation for the exercise             Vocational Rehabilitation: Provide vocational rehab assistance to qualifying candidates.   Vocational Rehab Evaluation & Intervention:  Vocational Rehab - 05/13/22 1420       Initial Vocational Rehab Evaluation & Intervention   Assessment shows need for Vocational Rehabilitation No             Education: Education Goals: Education classes will be provided on a variety of topics geared toward better understanding of heart health and risk factor modification. Participant will state understanding/return demonstration of topics presented as noted by education test scores.  Learning Barriers/Preferences:  Learning Barriers/Preferences - 05/13/22 1420       Learning Barriers/Preferences   Learning Barriers None    Learning Preferences Individual Instruction             General Cardiac Education Topics:  AED/CPR: - Group verbal and written instruction with the use of models to demonstrate the basic use of the AED with the basic ABC's of resuscitation.   Anatomy and Cardiac Procedures: - Group verbal and visual presentation and models provide information about basic cardiac anatomy and function. Reviews the testing methods done to diagnose heart disease and the outcomes of the test results. Describes the treatment choices: Medical Management, Angioplasty, or Coronary Bypass Surgery for treating various heart conditions including Myocardial Infarction, Angina, Valve Disease, and Cardiac Arrhythmias.  Written material given at graduation. Flowsheet Row Cardiac Rehab from 08/03/2022 in [facility] Cardiac and Pulmonary Rehab  Education need identified 05/23/22  Date 06/15/22  Educator SB  Instruction  Review Code 1- Verbalizes Understanding       Medication Safety: - Group verbal and visual instruction to review commonly prescribed medications for heart and lung disease. Reviews the medication, class of the drug, and side effects. Includes the steps to properly store meds and maintain the prescription regimen.  Written material given at graduation. Flowsheet Row Cardiac Rehab from 08/03/2022 in [facility] Cardiac and Pulmonary Rehab  Date 06/29/22  Educator SB  Instruction Review Code 1- Verbalizes Understanding       Intimacy: - Group verbal instruction through game format to discuss how heart and lung disease can affect sexual intimacy. Written material given at graduation.. Flowsheet Row Cardiac Rehab from 08/03/2022 in [facility] Cardiac and Pulmonary Rehab  Education need identified 05/23/22  Date 08/03/22  Educator [facility]  Instruction Review Code 1- Verbalizes Understanding       Know Your Numbers and Heart Failure: - Group verbal and visual instruction to discuss disease risk factors for cardiac and pulmonary disease and treatment options.  Reviews associated critical values for Overweight/Obesity, Hypertension, Cholesterol, and Diabetes.  Discusses basics of heart failure: signs/symptoms and treatments.  Introduces Heart Failure Zone chart for action plan for heart failure.  Written material given at graduation. Flowsheet Row Cardiac Rehab from 08/03/2022 in [facility] Cardiac and Pulmonary Rehab  Education need identified 05/23/22  Date 07/06/22  Educator SB  Instruction Review Code 1- Verbalizes Understanding       Infection Prevention: - Provides verbal and written material to individual with discussion of infection control including proper hand washing and proper equipment cleaning during exercise session. Flowsheet Row Cardiac Rehab from 08/03/2022 in [facility] Cardiac and Pulmonary Rehab  Date 05/23/22  Educator [facility]  Instruction Review Code 1- Verbalizes Understanding       Falls  Prevention: - Provides verbal and written material to individual with discussion of falls prevention and safety. Flowsheet Row Cardiac Rehab from 08/03/2022 in [facility] Cardiac and Pulmonary Rehab  Date 05/23/22  Educator [facility]  Instruction Review Code 1- Verbalizes Understanding       Other: -Provides group and verbal instruction on various topics (see comments)   Knowledge Questionnaire Score:  Knowledge Questionnaire Score - 05/23/22 1518       Knowledge Questionnaire Score   Pre Score 19/26             Core Components/Risk Factors/Patient Goals at Admission:  Personal Goals and Risk Factors at Admission - 05/23/22 1519       Core Components/Risk Factors/Patient Goals on Admission    Weight Management Yes    Intervention Weight Management: Develop a combined nutrition and exercise program designed to reach desired caloric intake, while maintaining appropriate intake of nutrient and fiber, sodium and fats, and appropriate energy expenditure required for the weight goal.;Weight Management: Provide education and appropriate resources to help participant work on and attain dietary goals.;Weight Management/Obesity: Establish reasonable short term and long term weight goals.;Obesity: Provide education and appropriate resources to help participant work on and attain dietary goals.    Admit Weight 245 lb 3.2 oz (111.2 kg)    Goal Weight: Short Term 240 lb (108.9 kg)    Goal Weight: Long Term 220 lb (99.8 kg)    Expected Outcomes Short Term: Continue to assess and modify interventions until short term weight is achieved;Long Term: Adherence to nutrition and physical activity/exercise program aimed toward attainment of established weight goal;Weight Loss: Understanding of general recommendations for a balanced deficit meal plan, which promotes 1-2 lb weight loss per week and includes a negative energy balance of [phone] kcal/d;Understanding recommendations for meals to include 15-35% energy as  protein, 25-35% energy from fat, 35-60% energy from carbohydrates, less than 269m of dietary cholesterol, 20-35 gm of total fiber daily;Understanding of distribution of calorie intake throughout the day with the consumption of 4-5 meals/snacks    Lipids Yes    Intervention Provide education and support for participant on nutrition & aerobic/resistive exercise along with prescribed medications to achieve LDL <749m HDL >4047m   Expected Outcomes Short Term: Participant states understanding of desired cholesterol values and is compliant with medications prescribed. Participant is following exercise prescription and nutrition guidelines.;Long Term: Cholesterol controlled with medications as prescribed, with individualized exercise RX and with  personalized nutrition plan. Value goals: LDL < 57m, HDL > 40 mg.             Education:Diabetes - Individual verbal and written instruction to review signs/symptoms of diabetes, desired ranges of glucose level fasting, after meals and with exercise. Acknowledge that pre and post exercise glucose checks will be done for 3 sessions at entry of program.   Core Components/Risk Factors/Patient Goals Review:   Goals and Risk Factor Review     Row Name 06/23/22 1408 07/13/22 1357           Core Components/Risk Factors/Patient Goals Review   Personal Goals Review Weight Management/Obesity Weight Management/Obesity;Lipids      Review JEmberliwants to lose more weight. She does not have alot of energy and is not able to do what she wants for exercise. She is trying to get more focused on her diet and is trying to eat healthier. JDalphine Cowiestates she has lost 10 lbs since she has started the program. She started off at 250 lb and is now down to 241 lb. Her goal is under 200 lb. She has been eating different, specifically eating less hotdogs, cheeseburgers, fries, and less processed food. She has a scale at home and weighs herself every other day. She has also been  doing a lot more walking at home. She has been taking all of her medications as prescribed but has complaints of muscle and joint pain which she may think is from her Rosuvastatin. She wants to to talk to her doctor about changing off a statin tp something else.      Expected Outcomes Short: lose 5 pounds in a couple weeks. Long: reach her weight goal. Short: Talk to doctor about possible stain side effects Long: Continue to manage lifestyle risk factors               Core Components/Risk Factors/Patient Goals at Discharge (Final Review):   Goals and Risk Factor Review - 07/13/22 1357       Core Components/Risk Factors/Patient Goals Review   Personal Goals Review Weight Management/Obesity;Lipids    Review JJimya Cianistates she has lost 10 lbs since she has started the program. She started off at 250 lb and is now down to 241 lb. Her goal is under 200 lb. She has been eating different, specifically eating less hotdogs, cheeseburgers, fries, and less processed food. She has a scale at home and weighs herself every other day. She has also been doing a lot more walking at home. She has been taking all of her medications as prescribed but has complaints of muscle and joint pain which she may think is from her Rosuvastatin. She wants to to talk to her doctor about changing off a statin tp something else.    Expected Outcomes Short: Talk to doctor about possible stain side effects Long: Continue to manage lifestyle risk factors             ITP Comments:  ITP Comments     Row Name 05/13/22 1412 05/23/22 1508 05/25/22 1337 06/01/22 0753 06/29/22 0701   ITP Comments Initial telephone orientation completed. [facility] EP orientation scheduled for Monday 8/28 at 1:30pm. Completed 6MWT and gym orientation. Initial ITP created and sent for review to Dr. MEmily Filbert Medical Director. First full day of exercise!  Patient was oriented to gym and equipment including functions,  settings, policies, and procedures.  Patient's individual exercise prescription and treatment plan were  reviewed.  All starting workloads were established based on the results of the 6 minute walk test done at initial orientation visit.  The plan for exercise progression was also introduced and progression will be customized based on patient's performance and goals.' 30 Day review completed. Medical Director ITP review done, changes made as directed, and signed approval by Medical Director.   NEW 30 Day review completed. Medical Director ITP review done, changes made as directed, and signed approval by Medical Director.    [place] Name 07/27/22 0915           ITP Comments 30 Day review completed. Medical Director ITP review done, changes made as directed, and signed approval by Medical Director.                Comments: ***

## 2022-08-04 NOTE — Progress Notes (Signed)
Daily Session Note  Patient Details  Name: Lexianna Weinrich MRN: [id number] Date of Birth: 1946/06/13 Referring Provider:   Flowsheet Row Cardiac Rehab from 05/23/2022 in [facility] Cardiac and Pulmonary Rehab  Referring Provider Dr. Kirk Ruths       Encounter Date: 08/04/2022  Check In:  Session Check In - 08/04/22 1422       Check-In   Supervising physician immediately available to respond to emergencies See telemetry face sheet for immediately available ER MD    Location [facility]-Cardiac & Pulmonary Rehab    Staff Present Heath Lark, RN, BSN, CCRP;Jessica [address], MA, RCEP, CCRP, CCET;Joseph [place], [place]    Virtual Visit No    Medication changes reported     No    Fall or balance concerns reported    No    Warm-up and Cool-down Performed on first and last piece of equipment    Resistance Training Performed Yes    VAD Patient? No    PAD/SET Patient? No      Pain Assessment   Currently in Pain? No/denies                Social History   Tobacco Use  Smoking Status Never  Smokeless Tobacco Never    Goals Met:  Independence with exercise equipment Exercise tolerated well No report of concerns or symptoms today  Goals Unmet:  Not Applicable  Comments: Pt able to follow exercise prescription today without complaint.  Will continue to monitor for progression.    Dr. Emily Filbert is Medical Director for [place].  Dr. Ottie Glazier is Medical Director for [facility] Pulmonary Rehabilitation.

## 2022-08-05 ENCOUNTER — Telehealth: Payer: Self-pay | Admitting: Cardiology

## 2022-08-05 DIAGNOSIS — E7800 Pure hypercholesterolemia, unspecified: Secondary | ICD-10-CM

## 2022-08-05 NOTE — Telephone Encounter (Signed)
Patient stated she had a visit with the Pharmacist on 10/31 and would like to know if she needs to have another visit with the Pharmacist.

## 2022-08-05 NOTE — Telephone Encounter (Signed)
Defer to [facility] for answer

## 2022-08-10 ENCOUNTER — Encounter: Payer: PPO | Admitting: *Deleted

## 2022-08-10 DIAGNOSIS — Z955 Presence of coronary angioplasty implant and graft: Secondary | ICD-10-CM

## 2022-08-10 NOTE — Progress Notes (Signed)
Daily Session Note  Patient Details  Name: [name] MRN: [id number] Date of Birth: 08-27-1946 Referring Provider:   Flowsheet Row Cardiac Rehab from 05/23/2022 in [facility] Cardiac and Pulmonary Rehab  Referring Provider Dr. Kirk Ruths       Encounter Date: 08/10/2022  Check In:  Session Check In - 08/10/22 1346       Check-In   Supervising physician immediately available to respond to emergencies See telemetry face sheet for immediately available ER MD    Location [facility]-Cardiac & Pulmonary Rehab    Staff Present Renita Papa, RN Moises Blood, BS, ACSM CEP, Exercise Physiologist;Megan Tamala Julian, RN, ADN    Virtual Visit No    Medication changes reported     No    Fall or balance concerns reported    No    Warm-up and Cool-down Performed on first and last piece of equipment    Resistance Training Performed Yes    VAD Patient? No    PAD/SET Patient? No      Pain Assessment   Currently in Pain? No/denies                Social History   Tobacco Use  Smoking Status Never  Smokeless Tobacco Never    Goals Met:  Independence with exercise equipment Exercise tolerated well No report of concerns or symptoms today Strength training completed today  Goals Unmet:  Not Applicable  Comments:  [name] graduated today from  rehab with 36 sessions completed.  Details of the patient's exercise prescription and what She needs to do in order to continue the prescription and progress were discussed with patient.  Patient was given a copy of prescription and goals.  Patient verbalized understanding.  [name] plans to continue to exercise by walking.    Dr. Emily Filbert is Medical Director for [place].  Dr. Ottie Glazier is Medical Director for [facility] Pulmonary Rehabilitation.

## 2022-08-10 NOTE — Progress Notes (Signed)
Discharge Summary   [name] (DOB 07/02/1946)  [name] graduated today from  rehab with 36 sessions completed.  Details of the patient's exercise prescription and what She needs to do in order to continue the prescription and progress were discussed with patient.  Patient was given a copy of prescription and goals.  Patient verbalized understanding.  Salihah plans to continue to exercise by walking.   [place] Name 05/23/22 1509 07/28/22 1412       6 Minute Walk   Phase Initial Discharge    Distance 1340 feet 1435 feet    Distance % Change -- 7 %    Distance Feet Change -- 95 ft    Walk Time 6 minutes 6 minutes    # of Rest Breaks 0 0    MPH 2.54 2.72    METS 2.31 2.69    RPE 10 13    Perceived Dyspnea  0 2    VO2 Peak 8.08 9.43    Symptoms No Yes (comment)    Comments -- SOB    Resting HR 70 bpm 70 bpm    Resting BP 122/80 120/68    Resting Oxygen Saturation  98 % 96 %    Exercise Oxygen Saturation  during 6 min walk 97 % 95 %    Max Ex. HR 98 bpm 115 bpm    Max Ex. BP 138/72 156/78    2 Minute Post BP 128/72 --

## 2022-08-10 NOTE — Progress Notes (Signed)
Cardiac Individual Treatment Plan  Patient Details  Name: [name] MRN: [id number] Date of Birth: 07/18/1946 Referring Provider:   Flowsheet Row Cardiac Rehab from 05/23/2022 in [facility] Cardiac and Pulmonary Rehab  Referring Provider Dr. Kirk Ruths       Initial Encounter Date:  Flowsheet Row Cardiac Rehab from 05/23/2022 in [facility] Cardiac and Pulmonary Rehab  Date 05/23/22       Visit Diagnosis: Status post coronary artery stent placement  Patient's Home Medications on Admission:  Current Outpatient Medications:    aspirin 81 MG tablet, Take 81 mg by mouth daily., Disp: , Rfl:    cyclobenzaprine (FLEXERIL) 5 MG tablet, Take 1-2 tablets (5-10 mg total) by mouth 3 (three) times daily as needed for muscle spasms. (Patient not taking: Reported on 07/26/2022), Disp: 30 tablet, Rfl: 0   ezetimibe (ZETIA) 10 MG tablet, Take 1 tablet (10 mg total) by mouth daily., Disp: 90 tablet, Rfl: 3   levothyroxine (SYNTHROID) 50 MCG tablet, TAKE 1 TABLET BY MOUTH EVERY DAY, Disp: 90 tablet, Rfl: 3   metoprolol succinate (TOPROL XL) 25 MG 24 hr tablet, Take 1 tablet (25 mg total) by mouth daily., Disp: 90 tablet, Rfl: 3   omeprazole (PRILOSEC) 20 MG capsule, Take 1 capsule (20 mg total) by mouth daily as needed. (Patient taking differently: Take 20 mg by mouth daily.), Disp: 90 capsule, Rfl: 3   rosuvastatin (CRESTOR) 40 MG tablet, Take 1 tablet (40 mg total) by mouth daily., Disp: 90 tablet, Rfl: 3   ticagrelor (BRILINTA) 90 MG TABS tablet, Take 1 tablet (90 mg total) by mouth 2 (two) times daily., Disp: 60 tablet, Rfl: 11  Past Medical History: Past Medical History:  Diagnosis Date   Anxiety and depression 09/11/2016   Borderline diabetes mellitus    Chest pain    Elevated TSH 06/23/2017   GERD (gastroesophageal reflux disease)    Grief reaction    Hyperlipidemia    Palpitations    Personal history of urinary calculi    Prediabetes 06/21/2016   Squamous cell skin cancer     bridge of nose ( Dr Tonia Brooms)   Subclinical hypothyroidism 05/17/2011   Venous insufficiency of right leg 06/23/2017   Follows with vascular   Weakness     Tobacco Use: Social History   Tobacco Use  Smoking Status Never  Smokeless Tobacco Never    Labs: Review Flowsheet  More data exists      Latest Ref Rng & Units 01/01/2020 10/21/2020 04/16/2021 01/31/2022 05/25/2022  Labs for ITP Cardiac and Pulmonary Rehab  Cholestrol 100 - 199 mg/dL - 208  221  219  120   LDL (calc) 0 - 99 mg/dL - 125  135  136  50   HDL-C >39 mg/dL - 64.60  65.00  64.00  56   Trlycerides 0 - 149 mg/dL - 93.0  102.0  92.0  68   Hemoglobin A1c 4.6 - 6.5 % 5.6  5.8  5.8  5.8  -     Exercise Target Goals: Exercise Program Goal: Individual exercise prescription set using results from initial 6 min walk test and THRR while considering  patient's activity barriers and safety.   Exercise Prescription Goal: Initial exercise prescription builds to 30-45 minutes a day of aerobic activity, 2-3 days per week.  Home exercise guidelines will be given to patient during program as part of exercise prescription that the participant will acknowledge.   Education: Aerobic Exercise: - Group verbal and visual presentation on  the components of exercise prescription. Introduces F.I.T.T principle from ACSM for exercise prescriptions.  Reviews F.I.T.T. principles of aerobic exercise including progression. Written material given at graduation. Flowsheet Row Cardiac Rehab from 08/10/2022 in [facility] Cardiac and Pulmonary Rehab  Date 08/03/22  Educator [facility]  Instruction Review Code 1- Verbalizes Understanding       Education: Resistance Exercise: - Group verbal and visual presentation on the components of exercise prescription. Introduces F.I.T.T principle from ACSM for exercise prescriptions  Reviews F.I.T.T. principles of resistance exercise including progression. Written material given at graduation. Flowsheet Row Cardiac Rehab from  08/10/2022 in [facility] Cardiac and Pulmonary Rehab  Date 06/08/22  Educator NT  Instruction Review Code 1- Verbalizes Understanding        Education: Exercise & Equipment Safety: - Individual verbal instruction and demonstration of equipment use and safety with use of the equipment. Flowsheet Row Cardiac Rehab from 08/10/2022 in [facility] Cardiac and Pulmonary Rehab  Date 05/23/22  Educator [facility]  Instruction Review Code 1- Verbalizes Understanding       Education: Exercise Physiology & General Exercise Guidelines: - Group verbal and written instruction with models to review the exercise physiology of the cardiovascular system and associated critical values. Provides general exercise guidelines with specific guidelines to those with heart or lung disease.  Flowsheet Row Cardiac Rehab from 08/10/2022 in [facility] Cardiac and Pulmonary Rehab  Education need identified 05/23/22  Date 07/27/22  Educator [place]  Instruction Review Code 1- Verbalizes Understanding       Education: Flexibility, Balance, Mind/Body Relaxation: - Group verbal and visual presentation with interactive activity on the components of exercise prescription. Introduces F.I.T.T principle from ACSM for exercise prescriptions. Reviews F.I.T.T. principles of flexibility and balance exercise training including progression. Also discusses the mind body connection.  Reviews various relaxation techniques to help reduce and manage stress (i.e. Deep breathing, progressive muscle relaxation, and visualization). Balance handout provided to take home. Written material given at graduation. Flowsheet Row Cardiac Rehab from 08/10/2022 in [facility] Cardiac and Pulmonary Rehab  Date 06/08/22  Educator NT  Instruction Review Code 1- Verbalizes Understanding       Activity Barriers & Risk Stratification:  Activity Barriers & Cardiac Risk Stratification - 05/13/22 1413       Activity Barriers & Cardiac Risk Stratification   Activity Barriers None     Cardiac Risk Stratification Moderate             6 Minute Walk:  6 Minute Walk     Row Name 05/23/22 1509 07/28/22 1412       6 Minute Walk   Phase Initial Discharge    Distance 1340 feet 1435 feet    Distance % Change -- 7 %    Distance Feet Change -- 95 ft    Walk Time 6 minutes 6 minutes    # of Rest Breaks 0 0    MPH 2.54 2.72    METS 2.31 2.69    RPE 10 13    Perceived Dyspnea  0 2    VO2 Peak 8.08 9.43    Symptoms No Yes (comment)    Comments -- SOB    Resting HR 70 bpm 70 bpm    Resting BP 122/80 120/68    Resting Oxygen Saturation  98 % 96 %    Exercise Oxygen Saturation  during 6 min walk 97 % 95 %    Max Ex. HR 98 bpm 115 bpm    Max Ex. BP 138/72 156/78  2 Minute Post BP 128/72 --             Oxygen Initial Assessment:   Oxygen Re-Evaluation:   Oxygen Discharge (Final Oxygen Re-Evaluation):   Initial Exercise Prescription:  Initial Exercise Prescription - 05/23/22 1500       Date of Initial Exercise RX and Referring Provider   Date 05/23/22    Referring Provider Dr. Kirk Ruths      Oxygen   Maintain Oxygen Saturation 88% or higher      NuStep   Level 2    SPM 80    Minutes 15    METs 2.31      REL-XR   Level 1    Speed 50    Minutes 15    METs 2.31      Track   Laps 35    Minutes 15    METs 2.9      Prescription Details   Frequency (times per week) 2    Duration Progress to 30 minutes of continuous aerobic without signs/symptoms of physical distress      Intensity   THRR 40-80% of Max Heartrate 100-130    Ratings of Perceived Exertion 11-13    Perceived Dyspnea 0-4      Progression   Progression Continue to progress workloads to maintain intensity without signs/symptoms of physical distress.      Resistance Training   Training Prescription Yes    Weight 3    Reps 10-15             Perform Capillary Blood Glucose checks as needed.  Exercise Prescription Changes:   Exercise Prescription Changes      Row Name 05/23/22 1500 06/01/22 1600 06/15/22 1500 06/28/22 1500 07/12/22 1400     Response to Exercise   Blood Pressure (Admit) 122/80 116/68 124/72 126/64 124/68   Blood Pressure (Exercise) 138/72 148/66 142/62 144/68 --   Blood Pressure (Exit) 128/72 122/66 128/68 124/60 108/70   Heart Rate (Admit) 70 bpm 75 bpm 66 bpm 77 bpm 77 bpm   Heart Rate (Exercise) 109 bpm 109 bpm 102 bpm 103 bpm 87 bpm   Heart Rate (Exit) 72 bpm 80 bpm 78 bpm 77 bpm 74 bpm   Oxygen Saturation (Admit) 98 % -- -- -- --   Oxygen Saturation (Exercise) 97 % -- -- -- --   Oxygen Saturation (Exit) 98 % -- -- -- --   Rating of Perceived Exertion (Exercise) _0 Perceived Dyspnea (Exercise) 0 -- -- -- --   Symptoms _1    Comments 6 MWT results First full day of exercise -- -- --   Duration -- Continue with 30 min of aerobic exercise without signs/symptoms of physical distress. Continue with 30 min of aerobic exercise without signs/symptoms of physical distress. Continue with 30 min of aerobic exercise without signs/symptoms of physical distress. Continue with 30 min of aerobic exercise without signs/symptoms of physical distress.   Intensity -- THRR unchanged THRR unchanged THRR unchanged THRR unchanged     Progression   Progression -- Continue to progress workloads to maintain intensity without signs/symptoms of physical distress. Continue to progress workloads to maintain intensity without signs/symptoms of physical distress. Continue to progress workloads to maintain intensity without signs/symptoms of physical distress. Continue to progress workloads to maintain intensity without signs/symptoms of physical distress.   Average METs -- 2.3 3.3 2.91 2.42     Resistance Training   Training Prescription --  Yes Yes Yes Yes   Weight -- 3 lb 3 lb 2 lb 2 lb   Reps -- 10-15 10-15 10-15 10-15     Interval Training   Interval Training -- -- No No No     NuStep   Level -- 3 3 -- --    Minutes -- 15 15 -- --   METs -- 1.4 -- -- --     REL-XR   Level -- _0 Minutes -- _1 METs -- 2.1 4.8 3 2.7     Track   Laps -- 34 40 25 25   Minutes -- _2 METs -- 2.85 3.18 2.36 2.36     Oxygen   Maintain Oxygen Saturation -- -- 88% or higher 88% or higher 88% or higher    Row Name 07/26/22 1500 08/10/22 0900           Response to Exercise   Blood Pressure (Admit) 116/68 126/74      Blood Pressure (Exit) 118/60 112/58      Heart Rate (Admit) 68 bpm 69 bpm      Heart Rate (Exercise) 90 bpm 115 bpm      Heart Rate (Exit) 67 bpm 67 bpm      Rating of Perceived Exertion (Exercise) 13 13      Symptoms none none      Duration Continue with 30 min of aerobic exercise without signs/symptoms of physical distress. Continue with 30 min of aerobic exercise without signs/symptoms of physical distress.      Intensity THRR unchanged THRR unchanged        Progression   Progression Continue to progress workloads to maintain intensity without signs/symptoms of physical distress. Continue to progress workloads to maintain intensity without signs/symptoms of physical distress.      Average METs 2.67 2.63        Resistance Training   Training Prescription Yes Yes      Weight 3 lb 2 lb      Reps 10-15 10-15        Interval Training   Interval Training No No        REL-XR   Level 2 7      Minutes 15 15      METs 3.4 4        Track   Laps 25 21      Minutes 15 15      METs 2.36 2.14        Oxygen   Maintain Oxygen Saturation 88% or higher 88% or higher               Exercise Comments:   Exercise Goals and Review:   Exercise Goals     Row Name 05/23/22 1513             Exercise Goals   Increase Physical Activity Yes       Intervention Provide advice, education, support and counseling about physical activity/exercise needs.;Develop an individualized exercise prescription for aerobic and resistive training based on initial evaluation  findings, risk stratification, comorbidities and participant's personal goals.       Expected Outcomes Short Term: Attend rehab on a regular basis to increase amount of physical activity.;Long Term: Add in home exercise to make exercise part of routine and to increase amount of physical activity.;Long Term: Exercising regularly at least 3-5 days a week.  Increase Strength and Stamina Yes       Intervention Provide advice, education, support and counseling about physical activity/exercise needs.;Develop an individualized exercise prescription for aerobic and resistive training based on initial evaluation findings, risk stratification, comorbidities and participant's personal goals.       Expected Outcomes Short Term: Increase workloads from initial exercise prescription for resistance, speed, and METs.;Short Term: Perform resistance training exercises routinely during rehab and add in resistance training at home;Long Term: Improve cardiorespiratory fitness, muscular endurance and strength as measured by increased METs and functional capacity (6MWT)       Able to understand and use rate of perceived exertion (RPE) scale Yes       Intervention Provide education and explanation on how to use RPE scale       Expected Outcomes Short Term: Able to use RPE daily in rehab to express subjective intensity level;Long Term:  Able to use RPE to guide intensity level when exercising independently       Able to understand and use Dyspnea scale Yes       Intervention Provide education and explanation on how to use Dyspnea scale       Expected Outcomes Short Term: Able to use Dyspnea scale daily in rehab to express subjective sense of shortness of breath during exertion;Long Term: Able to use Dyspnea scale to guide intensity level when exercising independently       Knowledge and understanding of Target Heart Rate Range (THRR) Yes       Intervention Provide education and explanation of THRR including how the numbers  were predicted and where they are located for reference       Expected Outcomes Short Term: Able to state/look up THRR;Short Term: Able to use daily as guideline for intensity in rehab;Long Term: Able to use THRR to govern intensity when exercising independently       Able to check pulse independently Yes       Intervention Provide education and demonstration on how to check pulse in carotid and radial arteries.;Review the importance of being able to check your own pulse for safety during independent exercise       Expected Outcomes Short Term: Able to explain why pulse checking is important during independent exercise;Long Term: Able to check pulse independently and accurately       Understanding of Exercise Prescription Yes       Intervention Provide education, explanation, and written materials on patient's individual exercise prescription       Expected Outcomes Short Term: Able to explain program exercise prescription;Long Term: Able to explain home exercise prescription to exercise independently                Exercise Goals Re-Evaluation :  Exercise Goals Re-Evaluation     Row Name 05/25/22 1337 06/01/22 1627 06/15/22 1533 06/28/22 1522 07/12/22 1442     Exercise Goal Re-Evaluation   Exercise Goals Review Increase Physical Activity;Able to understand and use rate of perceived exertion (RPE) scale;Knowledge and understanding of Target Heart Rate Range (THRR);Understanding of Exercise Prescription;Increase Strength and Stamina;Able to check pulse independently Increase Physical Activity;Understanding of Exercise Prescription;Increase Strength and Stamina Increase Physical Activity;Understanding of Exercise Prescription;Increase Strength and Stamina -- Increase Physical Activity;Understanding of Exercise Prescription;Increase Strength and Stamina   Comments Reviewed RPE and dyspnea scales, THR and program prescription with pt today.  Pt voiced understanding and was given a copy of goals to  take home. Corianna Avallone is off to a good start in rehab.  She had an average overall MET level of 2.3 METs. She also was able to walk 34 laps on the track. She tolerated level 1 on the XR and level 3 on the T4 as well. We will continue to monitor her progress in the program. Nela Bascom continues to do well in rehab. She increased her overall average MET level to 3.3 METs. She also was able to walk up to 40 laps on the track. She improved to level 3 on the XR as well. We will continue to monitor her progress in the program. Laurie Penado continues to do well in rehab. She did see a decrease in her METs to 2.91 METs. She did tolerate the XR machine at level 3. Her laps on the track did see a little bit of a decrease also. We will continue to monitor her progress in the program. megen madewell is doing well in rehab. She did see a decrease in her overall average METs to 2.42. She increased her level on the XR to level 4. She also has consistently reached 25 laps on the track. We will continue to monitor her progress in the program.   Expected Outcomes Short: Use RPE daily to regulate intensity. Long: Follow program prescription in THR. Short: Continue to increase workloads. Long: Continue to increase strength and stamina. Short: Continue to increase laps on the track. Long: Continue to increase strength and stamina. Short: Continue to increase workloads and laps on the track. Long: Continue to increase strength and stamina. Short: Continue to increase workloads and laps on the track. Long: Continue to increase strength and stamina.    [place] Name 07/13/22 1344 07/26/22 1515 08/04/22 1406 08/10/22 0908       Exercise Goal Re-Evaluation   Exercise Goals Review Increase Physical Activity;Understanding of Exercise Prescription;Increase Strength and Stamina;Able to check pulse independently;Knowledge and understanding of Target Heart Rate Range (THRR) Increase Physical Activity;Understanding of Exercise Prescription;Increase Strength  and Stamina Increase Physical Activity;Understanding of Exercise Prescription;Increase Strength and Stamina Increase Physical Activity;Understanding of Exercise Prescription;Increase Strength and Stamina    Comments Reviewed home exercise with pt today.  Pt plans to walk for exercise. Patient is also a member at [employer] and plans to go there when the weather gets colder or not safe to walk ouside.  Reviewed THR, pulse, RPE, sign and symptoms, pulse oximetery and when to call 911 or MD.  Also discussed weather considerations and indoor options.  Pt voiced understanding. She has a pulse ox to wear but not sure if it works and she was encouraged to bring it in to check. Maloree Uplinger continues to come to rehab. She has been working primarily on the XR and track and staff will encourage to use other different machines. She has been at a consistent 25 laps on the track and should be encouraged to increase more over time. She is also due for her post 6MWT next week and we hope to see improvement. Will continue to monitor. Alianny Toelle is doing well. She graduates next week.  She plans to continue to exercise by walking at home and going back to [employer]. She feels that she has more stamina. Dawnielle Christiana is doing well in rehab and is almost ready to graduate. She recently improved on her post 6MWT by 95 feet! She also improved to level 7 on the XR. We will continue to monitor her progress until she graduates from the program.    Expected Outcomes Short: Bring in pulse ox to  check HR, start checking during exercise Long: Continue to exercise independently at appropriate prescription Short: Increase laps on track and improve on post 6MWT Long: Continue to increase overall MET level Continue to exercise indpendently Short: Graduate. Long: Continue to exercise indpendently             Discharge Exercise Prescription (Final Exercise Prescription Changes):  Exercise Prescription Changes - 08/10/22 0900        Response to Exercise   Blood Pressure (Admit) 126/74    Blood Pressure (Exit) 112/58    Heart Rate (Admit) 69 bpm    Heart Rate (Exercise) 115 bpm    Heart Rate (Exit) 67 bpm    Rating of Perceived Exertion (Exercise) 13    Symptoms none    Duration Continue with 30 min of aerobic exercise without signs/symptoms of physical distress.    Intensity THRR unchanged      Progression   Progression Continue to progress workloads to maintain intensity without signs/symptoms of physical distress.    Average METs 2.63      Resistance Training   Training Prescription Yes    Weight 2 lb    Reps 10-15      Interval Training   Interval Training No      REL-XR   Level 7    Minutes 15    METs 4      Track   Laps 21    Minutes 15    METs 2.14      Oxygen   Maintain Oxygen Saturation 88% or higher             Nutrition:  Target Goals: Understanding of nutrition guidelines, daily intake of sodium <1571m, cholesterol <2077m calories 30% from fat and 7% or less from saturated fats, daily to have 5 or more servings of fruits and vegetables.  Education: All About Nutrition: -Group instruction provided by verbal, written material, interactive activities, discussions, models, and posters to present general guidelines for heart healthy nutrition including fat, fiber, MyPlate, the role of sodium in heart healthy nutrition, utilization of the nutrition label, and utilization of this knowledge for meal planning. Follow up email sent as well. Written material given at graduation. [facility]  Education need identified 05/23/22  Date 08/10/22  [part 1 11/15]  [facility] Code 1- Verbalizes Understanding       Biometrics:  Pre Biometrics - 05/23/22 1514       Pre Biometrics   Height 5' 7.75" (1.721 m)    Weight 245 lb 3.2 oz (111.2 kg)    Waist Circumference 42.25 inches    Hip Circumference 55  inches    Waist to Hip Ratio 0.77 %    BMI (Calculated) 37.55    Single Leg Stand 4.42 seconds             Post Biometrics - 07/28/22 1414        Post  Biometrics   Weight 244 lb 12.8 oz (111 kg)    Single Leg Stand 5.92 seconds             Nutrition Therapy Plan and Nutrition Goals:  Nutrition Therapy & Goals - 07/13/22 1359       Nutrition Therapy   RD appointment deferred Yes             Nutrition Assessments:  MEDIFICTS Score Key: ?70 Need to make dietary changes  40-70 Heart Healthy  Diet ? 40 Therapeutic Level Cholesterol Diet  Flowsheet Row Cardiac Rehab from 05/23/2022 in [facility] Cardiac and Pulmonary Rehab  Picture Your Plate Total Score on Admission 64      Picture Your Plate Scores: <96 Unhealthy dietary pattern with much room for improvement. 41-50 Dietary pattern unlikely to meet recommendations for good health and room for improvement. 51-60 More healthful dietary pattern, with some room for improvement.  >60 Healthy dietary pattern, although there may be some specific behaviors that could be improved.    Nutrition Goals Re-Evaluation:  Nutrition Goals Re-Evaluation     [place] Name 07/13/22 1359 08/04/22 1431           Goals   Nutrition Goal [place] is still declining nutrition at this time. Deosha is still declining nutrition at this time.      Comment -- Camri Molloy continues to feel good about her diet.  She know what to do, just cheats with her lemon merguine pie.  She is getting in a good variety.      Expected Outcome -- Continue to aim for heart healthy diet.               Nutrition Goals Discharge (Final Nutrition Goals Re-Evaluation):  Nutrition Goals Re-Evaluation - 08/04/22 1431       Goals   Nutrition Goal Taylynn is still declining nutrition at this time.    Comment Kachina Niederer continues to feel good about her diet.  She know what to do, just cheats with her lemon merguine pie.  She is getting in a good variety.    Expected  Outcome Continue to aim for heart healthy diet.             Psychosocial: Target Goals: Acknowledge presence or absence of significant depression and/or stress, maximize coping skills, provide positive support system. Participant is able to verbalize types and ability to use techniques and skills needed for reducing stress and depression.   Education: Stress, Anxiety, and Depression - Group verbal and visual presentation to define topics covered.  Reviews how body is impacted by stress, anxiety, and depression.  Also discusses healthy ways to reduce stress and to treat/manage anxiety and depression.  Written material given at graduation. Flowsheet Row Cardiac Rehab from 08/10/2022 in [facility] Cardiac and Pulmonary Rehab  Date 07/20/22  Educator [place]  Instruction Review Code 1- Verbalizes Understanding       Education: Sleep Hygiene -Provides group verbal and written instruction about how sleep can affect your health.  Define sleep hygiene, discuss sleep cycles and impact of sleep habits. Review good sleep hygiene tips.    Initial Review & Psychosocial Screening:  Initial Psych Review & Screening - 05/13/22 1420       Initial Review   Current issues with Current Stress Concerns      Family Dynamics   Good Support System? Yes      Barriers   Psychosocial barriers to participate in program There are no identifiable barriers or psychosocial needs.;The patient should benefit from training in stress management and relaxation.      Screening Interventions   Interventions Encouraged to exercise;Provide feedback about the scores to participant;To provide support and resources with identified psychosocial needs    Expected Outcomes Short Term goal: Utilizing psychosocial counselor, staff and physician to assist with identification of specific Stressors or current issues interfering with healing process. Setting desired goal for each stressor or current issue identified.;Long Term Goal:  Stressors or current issues are controlled or eliminated.;Short  Term goal: Identification and review with participant of any Quality of Life or Depression concerns found by scoring the questionnaire.;Long Term goal: The participant improves quality of Life and PHQ9 Scores as seen by post scores and/or verbalization of changes             Quality of Life Scores:   Quality of Life - 05/23/22 1517       Quality of Life   Select Quality of Life      Quality of Life Scores   Health/Function Pre 25.71 %    Socioeconomic Pre 27 %    Psych/Spiritual Pre 26.57 %    Family Pre 24 %    GLOBAL Pre 26.14 %            Scores of 19 and below usually indicate a poorer quality of life in these areas.  A difference of  2-3 points is a clinically meaningful difference.  A difference of 2-3 points in the total score of the Quality of Life Index has been associated with significant improvement in overall quality of life, self-image, physical symptoms, and general health in studies assessing change in quality of life.  PHQ-9: Review Flowsheet  More data exists      06/30/2022 05/23/2022 04/12/2022 03/23/2021 04/15/2020  Depression screen PHQ 2/9  Decreased Interest 0 1 0 0 0  Down, Depressed, Hopeless 0 1 0 0 0  PHQ - 2 Score 0 2 0 0 0  Altered sleeping - 0 - - 0  Tired, decreased energy - 1 - - 0  Change in appetite - 0 - - 0  Feeling bad or failure about yourself  - 0 - - 0  Trouble concentrating - 1 - - 0  Moving slowly or fidgety/restless - 0 - - 0  Suicidal thoughts - 0 - - 0  PHQ-9 Score - 4 - - 0  Difficult doing work/chores - Not difficult at all - - Not difficult at all   Interpretation of Total Score  Total Score Depression Severity:  1-4 = Minimal depression, 5-9 = Mild depression, 10-14 = Moderate depression, 15-19 = Moderately severe depression, 20-27 = Severe depression   Psychosocial Evaluation and Intervention:  Psychosocial Evaluation - 05/13/22 1436       Psychosocial  Evaluation & Interventions   Interventions Encouraged to exercise with the program and follow exercise prescription;Stress management education;Relaxation education    Comments Syrai Gladwin has only been on thyroid medication until her recent stent which led to being put on blood pressure, cholesteral and blood thinner medication. She really wants to get off as much medication as she can as she doesn't like the side effects. She has a history of increased stress. She states she realized something was off in the spring when she was putting out mulch and kept getting short of breath which prompted her to seek evaluation from her MD. She feels thankful it was just a stent and not bypass like her husband (who has passed now) needed years ago. She is very interested in the education component, especially nutrition.    Expected Outcomes Short: attend cardiac rehab for education and exercise. Long: develop and maintian positive self care habits.    Continue Psychosocial Services  Follow up required by staff             Psychosocial Re-Evaluation:  Psychosocial Re-Evaluation     [place] Name 06/23/22 1411 07/13/22 1406 08/04/22 1414         Psychosocial Re-Evaluation  Current issues with None Identified None Identified None Identified     Comments Patient reports no issues with their current mental states, sleep, stress, depression or anxiety. Will follow up with patient in a few weeks for any changes. Tamerra Merkley is doing well mentally. She has been in a long-term relationship for a while which had some hiccups but says they are much better now. 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 She knows to reach out if anything changes. Her sleep has been OK, sometimes she gets up in the middle of the night to use the bathroom and will get back to sleep some nights but others it takes her hours.  She is not looking for any intervention with that. She has been feeling low energy and wants to talk to her doctor about taking B12. Gennesis Hogland has done well in rehab.  She has enjoyed the classes and her classmates and staff.  She learned a lot in education classes too.     Expected Outcomes Short: Continue to exercise regularly to support mental health and notify staff of any changes. Long: maintain mental health and well being through teaching of rehab or prescribed medications independently. Short: Talk to doctor about B12 Long: Continue to utilize exercise for stress management and maintain positive attitude Continue to exercise for mental boost and stay positive     Interventions Encouraged to attend Cardiac Rehabilitation for the exercise Encouraged to attend Cardiac Rehabilitation for the exercise Encouraged to attend Cardiac Rehabilitation for the exercise     Continue Psychosocial Services  Follow up required by staff Follow up required by staff --              Psychosocial Discharge (Final Psychosocial Re-Evaluation):  Psychosocial Re-Evaluation - 08/04/22 1414       Psychosocial Re-Evaluation   Current issues with None Identified    Comments Meris Reede has done well in rehab.  She has enjoyed the classes and her classmates and staff.  She learned a lot in education classes too.    Expected Outcomes Continue to exercise for mental boost and stay positive    Interventions Encouraged to attend Cardiac Rehabilitation for the exercise             Vocational Rehabilitation: Provide vocational rehab assistance to qualifying candidates.   Vocational Rehab Evaluation & Intervention:  Vocational Rehab - 05/13/22 1420       Initial Vocational Rehab Evaluation & Intervention   Assessment shows need for Vocational Rehabilitation No             Education: Education Goals: Education classes will be provided on a variety of topics geared toward better understanding of heart  health and risk factor modification. Participant will state understanding/return demonstration of topics presented as noted by education test scores.  Learning Barriers/Preferences:  Learning Barriers/Preferences - 05/13/22 1420       Learning Barriers/Preferences   Learning Barriers None    Learning Preferences Individual Instruction             General Cardiac Education Topics:  AED/CPR: - Group verbal and written instruction with the use of models to demonstrate the basic use of the AED with the basic ABC's of resuscitation.   Anatomy and Cardiac Procedures: - Group verbal and visual presentation and models provide information about basic cardiac anatomy and function. Reviews the  testing methods done to diagnose heart disease and the outcomes of the test results. Describes the treatment choices: Medical Management, Angioplasty, or Coronary Bypass Surgery for treating various heart conditions including Myocardial Infarction, Angina, Valve Disease, and Cardiac Arrhythmias.  Written material given at graduation. Flowsheet Row Cardiac Rehab from 08/10/2022 in [facility] Cardiac [facility] Rehab  Education need identified 05/23/22  Date 06/15/22  Educator SB  Instruction Review Code 1- Verbalizes Understanding       Medication Safety: - Group verbal and visual instruction to review commonly prescribed medications for heart and lung disease. Reviews the medication, class of the drug, and side effects. Includes the steps to properly store meds and maintain the prescription regimen.  Written material given at graduation. Flowsheet Row Cardiac Rehab from 08/10/2022 in [facility] Cardiac and Pulmonary Rehab  Date 06/29/22  Educator SB  Instruction Review Code 1- Verbalizes Understanding       Intimacy: - Group verbal instruction through game format to discuss how heart and lung disease can affect sexual intimacy. Written material given at graduation.. Flowsheet Row Cardiac Rehab from  08/10/2022 in [facility] Cardiac and Pulmonary Rehab  Education need identified 05/23/22  Date 08/03/22  Educator [facility]  Instruction Review Code 1- Verbalizes Understanding       Know Your Numbers and Heart Failure: - Group verbal and visual instruction to discuss disease risk factors for cardiac and pulmonary disease and treatment options.  Reviews associated critical values for Overweight/Obesity, Hypertension, Cholesterol, and Diabetes.  Discusses basics of heart failure: signs/symptoms and treatments.  Introduces Heart Failure Zone chart for action plan for heart failure.  Written material given at graduation. Flowsheet Row Cardiac Rehab from 08/10/2022 in [facility] Cardiac and Pulmonary Rehab  Education need identified 05/23/22  Date 07/06/22  Educator SB  Instruction Review Code 1- Verbalizes Understanding       Infection Prevention: - Provides verbal and written material to individual with discussion of infection control including proper hand washing and proper equipment cleaning during exercise session. Flowsheet Row Cardiac Rehab from 08/10/2022 in [facility] Cardiac and Pulmonary Rehab  Date 05/23/22  Educator [facility]  Instruction Review Code 1- Verbalizes Understanding       Falls Prevention: - Provides verbal and written material to individual with discussion of falls prevention and safety. Flowsheet Row Cardiac Rehab from 08/10/2022 in [facility] Cardiac and Pulmonary Rehab  Date 05/23/22  Educator [facility]  Instruction Review Code 1- Verbalizes Understanding       Other: -Provides group and verbal instruction on various topics (see comments)   Knowledge Questionnaire Score:  Knowledge Questionnaire Score - 05/23/22 1518       Knowledge Questionnaire Score   Pre Score 19/26             Core Components/Risk Factors/Patient Goals at Admission:  Personal Goals and Risk Factors at Admission - 05/23/22 1519       Core Components/Risk Factors/Patient Goals on Admission    Weight  Management Yes    Intervention Weight Management: Develop a combined nutrition and exercise program designed to reach desired caloric intake, while maintaining appropriate intake of nutrient and fiber, sodium and fats, and appropriate energy expenditure required for the weight goal.;Weight Management: Provide education and appropriate resources to help participant work on and attain dietary goals.;Weight Management/Obesity: Establish reasonable short term and long term weight goals.;Obesity: Provide education and appropriate resources to help participant work on and attain dietary goals.    Admit Weight 245 lb 3.2 oz (111.2 kg)  Goal Weight: Short Term 240 lb (108.9 kg)    Goal Weight: Long Term 220 lb (99.8 kg)    Expected Outcomes Short Term: Continue to assess and modify interventions until short term weight is achieved;Long Term: Adherence to nutrition and physical activity/exercise program aimed toward attainment of established weight goal;Weight Loss: Understanding of general recommendations for a balanced deficit meal plan, which promotes 1-2 lb weight loss per week and includes a negative energy balance of [phone] kcal/d;Understanding recommendations for meals to include 15-35% energy as protein, 25-35% energy from fat, 35-60% energy from carbohydrates, less than 229m of dietary cholesterol, 20-35 gm of total fiber daily;Understanding of distribution of calorie intake throughout the day with the consumption of 4-5 meals/snacks    Lipids Yes    Intervention Provide education and support for participant on nutrition & aerobic/resistive exercise along with prescribed medications to achieve LDL <741m HDL >405m   Expected Outcomes Short Term: Participant states understanding of desired cholesterol values and is compliant with medications prescribed. Participant is following exercise prescription and nutrition guidelines.;Long Term: Cholesterol controlled with medications as prescribed, with  individualized exercise RX and with personalized nutrition plan. Value goals: LDL < 55m12mDL > 40 mg.             Education:Diabetes - Individual verbal and written instruction to review signs/symptoms of diabetes, desired ranges of glucose level fasting, after meals and with exercise. Acknowledge that pre and post exercise glucose checks will be done for 3 sessions at entry of program.   Core Components/Risk Factors/Patient Goals Review:   Goals and Risk Factor Review     Row Name 06/23/22 1408 07/13/22 1357 08/04/22 1433         Core Components/Risk Factors/Patient Goals Review   Personal Goals Review Weight Management/Obesity Weight Management/Obesity;Lipids Weight Management/Obesity;Lipids     Review JerrNenets to lose more weight. She does not have alot of energy and is not able to do what she wants for exercise. She is trying to get more focused on her diet and is trying to eat healthier. JerrPressley Barskytes she has lost 10 lbs since she has started the program. She started off at 250 lb and is now down to 241 lb. Her goal is under 200 lb. She has been eating different, specifically eating less hotdogs, cheeseburgers, fries, and less processed food. She has a scale at home and weighs herself every other day. She has also been doing a lot more walking at home. She has been taking all of her medications as prescribed but has complaints of muscle and joint pain which she may think is from her Rosuvastatin. She wants to to talk to her doctor about changing off a statin tp something else. JerrClaribel Sachs done well in rehab. She continues to work on weight loss and hopes to continue to lose.  Her pressures are doing well.     Expected Outcomes Short: lose 5 pounds in a couple weeks. Long: reach her weight goal. Short: Talk to doctor about possible stain side effects Long: Continue to manage lifestyle risk factors Continue to monitor risk factors              Core Components/Risk  Factors/Patient Goals at Discharge (Final Review):   Goals and Risk Factor Review - 08/04/22 1433       Core Components/Risk Factors/Patient Goals Review   Personal Goals Review Weight Management/Obesity;Lipids    Review JerrSereena Marando done well in rehab. She continues to work  on weight loss and hopes to continue to lose.  Her pressures are doing well.    Expected Outcomes Continue to monitor risk factors             ITP Comments:  ITP Comments     Row Name 05/13/22 1412 05/23/22 1508 05/25/22 1337 06/01/22 0753 06/29/22 0701   ITP Comments Initial telephone orientation completed. Diagnosis can be found in [facility] 7/[facility]. EP orientation scheduled for Monday 8/28 at 1:30pm. Completed 6MWT and gym orientation. Initial ITP created and sent for review to Dr. Emily Filbert, Medical Director. First full day of exercise!  Patient was oriented to gym and equipment including functions, settings, policies, and procedures.  Patient's individual exercise prescription and treatment plan were reviewed.  All starting workloads were established based on the results of the 6 minute walk test done at initial orientation visit.  The plan for exercise progression was also introduced and progression will be customized based on patient's performance and goals.' 30 Day review completed. Medical Director ITP review done, changes made as directed, and signed approval by Medical Director.   NEW 30 Day review completed. Medical Director ITP review done, changes made as directed, and signed approval by Medical Director.    [place] Name 07/27/22 0915 08/10/22 1347         ITP Comments 30 Day review completed. Medical Director ITP review done, changes made as directed, and signed approval by Medical Director. Yarimar graduated today from  rehab with 36 sessions completed.  Details of the patient's exercise prescription and what She needs to do in order to continue the prescription and progress were discussed with patient.  Patient was  given a copy of prescription and goals.  Patient verbalized understanding.  Javonda plans to continue to exercise by walking.               Comments: discharge ITP

## 2022-08-11 ENCOUNTER — Telehealth: Payer: Self-pay

## 2022-08-11 NOTE — Telephone Encounter (Signed)
        Patient  visited [facility] on 07/26/2022  for Low back pain, unspecified.   Telephone encounter attempt :  1st  Unable to leave message voicemail if full.   [place] Resource Care Guide   ??[email or web address]  ?? [phone]   Website: [email or web address]

## 2022-08-12 ENCOUNTER — Telehealth: Payer: Self-pay

## 2022-08-12 NOTE — Telephone Encounter (Signed)
        Patient  visited [facility] on 07/26/2022  for Low back pain, unspecified.   Telephone encounter attempt :  2nd  Unable to leave message voicemail is full.   [place] Resource Care Guide   ??[email or web address]  ?? [phone]   Website: [email or web address]

## 2022-08-15 ENCOUNTER — Telehealth: Payer: Self-pay

## 2022-08-15 NOTE — Telephone Encounter (Addendum)
        Patient  visited [facility] on 07/26/2022  for Low back pain, unspecified.   Telephone encounter attempt :  3rd  Unable to leave message voicemail is full.   [place] Resource Care Guide   ??[email or web address]  ?? [phone]   Website: [email or web address]

## 2022-08-15 NOTE — Addendum Note (Signed)
Addended by: Rockne Menghini on: 08/15/2022 11:47 AM   Modules accepted: Orders

## 2022-08-26 ENCOUNTER — Encounter: Payer: Self-pay | Admitting: Pharmacist Clinician (PhC)/ Clinical Pharmacy Specialist

## 2022-08-29 ENCOUNTER — Other Ambulatory Visit: Payer: Self-pay

## 2022-08-29 ENCOUNTER — Emergency Department (HOSPITAL_BASED_OUTPATIENT_CLINIC_OR_DEPARTMENT_OTHER): Payer: PPO

## 2022-08-29 ENCOUNTER — Other Ambulatory Visit (HOSPITAL_BASED_OUTPATIENT_CLINIC_OR_DEPARTMENT_OTHER): Payer: Self-pay

## 2022-08-29 ENCOUNTER — Encounter (HOSPITAL_BASED_OUTPATIENT_CLINIC_OR_DEPARTMENT_OTHER): Payer: Self-pay | Admitting: Emergency Medicine

## 2022-08-29 ENCOUNTER — Emergency Department (HOSPITAL_BASED_OUTPATIENT_CLINIC_OR_DEPARTMENT_OTHER)
Admission: EM | Admit: 2022-08-29 | Discharge: 2022-08-29 | Disposition: A | Discharge status: Home / Self Care | Payer: PPO | Attending: Emergency Medicine | Admitting: Emergency Medicine

## 2022-08-29 DIAGNOSIS — J210 Acute bronchiolitis due to respiratory syncytial virus: Secondary | ICD-10-CM | POA: Diagnosis not present

## 2022-08-29 DIAGNOSIS — Z7982 Long term (current) use of aspirin: Secondary | ICD-10-CM | POA: Diagnosis not present

## 2022-08-29 DIAGNOSIS — N183 Chronic kidney disease, stage 3 unspecified: Secondary | ICD-10-CM | POA: Diagnosis not present

## 2022-08-29 DIAGNOSIS — Z79899 Other long term (current) drug therapy: Secondary | ICD-10-CM | POA: Diagnosis not present

## 2022-08-29 DIAGNOSIS — R058 Other specified cough: Secondary | ICD-10-CM | POA: Diagnosis not present

## 2022-08-29 DIAGNOSIS — M791 Myalgia, unspecified site: Secondary | ICD-10-CM | POA: Diagnosis not present

## 2022-08-29 DIAGNOSIS — Z1152 Encounter for screening for COVID-19: Secondary | ICD-10-CM | POA: Diagnosis not present

## 2022-08-29 DIAGNOSIS — Z85828 Personal history of other malignant neoplasm of skin: Secondary | ICD-10-CM | POA: Insufficient documentation

## 2022-08-29 DIAGNOSIS — E039 Hypothyroidism, unspecified: Secondary | ICD-10-CM | POA: Insufficient documentation

## 2022-08-29 DIAGNOSIS — E119 Type 2 diabetes mellitus without complications: Secondary | ICD-10-CM | POA: Insufficient documentation

## 2022-08-29 DIAGNOSIS — I2510 Atherosclerotic heart disease of native coronary artery without angina pectoris: Secondary | ICD-10-CM | POA: Insufficient documentation

## 2022-08-29 DIAGNOSIS — Z8616 Personal history of COVID-19: Secondary | ICD-10-CM | POA: Insufficient documentation

## 2022-08-29 DIAGNOSIS — R059 Cough, unspecified: Secondary | ICD-10-CM | POA: Diagnosis not present

## 2022-08-29 DIAGNOSIS — J9811 Atelectasis: Secondary | ICD-10-CM | POA: Diagnosis not present

## 2022-08-29 LAB — BASIC METABOLIC PANEL
Anion gap: 11 (ref 5–15)
BUN: 13 mg/dL (ref 8–23)
CO2: 25 mmol/L (ref 22–32)
Calcium: 9.1 mg/dL (ref 8.9–10.3)
Chloride: 99 mmol/L (ref 98–111)
Creatinine, Ser: 0.91 mg/dL (ref 0.44–1.00)
GFR, Estimated: >60 mL/min (ref >60)
Glucose, Bld: 122 mg/dL — ABNORMAL HIGH (ref 70–99)
Potassium: 4.2 mmol/L (ref 3.5–5.1)
Sodium: 135 mmol/L (ref 135–145)

## 2022-08-29 LAB — CBC WITH DIFFERENTIAL/PLATELET
Abs Immature Granulocytes: 0.02 10*3/uL (ref 0.00–0.07)
Basophils Absolute: 0 10*3/uL (ref 0.0–0.1)
Basophils Relative: 0 %
Eosinophils Absolute: 0 10*3/uL (ref 0.0–0.5)
Eosinophils Relative: 0 %
HCT: 41 % (ref 36.0–46.0)
Hemoglobin: 13.4 g/dL (ref 12.0–15.0)
Immature Granulocytes: 0 %
Lymphocytes Relative: 9 %
Lymphs Abs: 0.9 10*3/uL (ref 0.7–4.0)
MCH: 29.5 pg (ref 26.0–34.0)
MCHC: 32.7 g/dL (ref 30.0–36.0)
MCV: 90.3 fL (ref 80.0–100.0)
Monocytes Absolute: 0.8 10*3/uL (ref 0.1–1.0)
Monocytes Relative: 8 %
Neutro Abs: 7.5 10*3/uL (ref 1.7–7.7)
Neutrophils Relative %: 83 %
Platelets: 135 10*3/uL — ABNORMAL LOW (ref 150–400)
RBC: 4.54 MIL/uL (ref 3.87–5.11)
RDW: 13.3 % (ref 11.5–15.5)
WBC: 9.2 10*3/uL (ref 4.0–10.5)
nRBC: 0 % (ref 0.0–0.2)

## 2022-08-29 LAB — RESP PANEL BY RT-PCR (RSV, FLU A&B, COVID)  RVPGX2
Influenza A by PCR: NEGATIVE
Influenza B by PCR: NEGATIVE
Resp Syncytial Virus by PCR: POSITIVE — AB
SARS Coronavirus 2 by RT PCR: NEGATIVE

## 2022-08-29 MED ORDER — ACETAMINOPHEN 325 MG PO TABS
650.0000 mg | ORAL_TABLET | Freq: Once | ORAL | Status: AC
  Administered 2022-08-29: 650 mg via ORAL

## 2022-08-29 MED ORDER — IPRATROPIUM-ALBUTEROL 0.5-2.5 (3) MG/3ML IN SOLN
3.0000 mL | Freq: Once | RESPIRATORY_TRACT | Status: AC
  Administered 2022-08-29: 3 mL via RESPIRATORY_TRACT
  Filled 2022-08-29: qty 3

## 2022-08-29 MED ORDER — SODIUM CHLORIDE 0.9 % IV BOLUS
1000.0000 mL | Freq: Once | INTRAVENOUS | Status: AC
  Administered 2022-08-29: 1000 mL via INTRAVENOUS

## 2022-08-29 MED ORDER — GUAIFENESIN 100 MG/5ML PO LIQD
5.0000 mL | ORAL | 0 refills | Status: DC | PRN
  Filled 2022-08-29: qty 118, 4d supply, fill #0

## 2022-08-29 MED ORDER — FLUTICASONE PROPIONATE 50 MCG/ACT NA SUSP
1.0000 | Freq: Every day | NASAL | 0 refills | Status: DC
  Filled 2022-08-29: qty 16, 7d supply, fill #0

## 2022-08-29 MED ORDER — KETOROLAC TROMETHAMINE 15 MG/ML IJ SOLN
15.0000 mg | Freq: Once | INTRAMUSCULAR | Status: AC
  Administered 2022-08-29: 15 mg via INTRAVENOUS
  Filled 2022-08-29: qty 1

## 2022-08-29 MED ORDER — GUAIFENESIN 100 MG/5ML PO LIQD
5.0000 mL | Freq: Once | ORAL | Status: AC
  Administered 2022-08-29: 5 mL via ORAL
  Filled 2022-08-29: qty 10

## 2022-08-29 MED ORDER — IBUPROFEN 600 MG PO TABS
600.0000 mg | ORAL_TABLET | Freq: Four times a day (QID) | ORAL | 0 refills | Status: DC | PRN
  Filled 2022-08-29: qty 30, 8d supply, fill #0

## 2022-08-29 MED ORDER — ACETAMINOPHEN 325 MG PO TABS
650.0000 mg | ORAL_TABLET | Freq: Four times a day (QID) | ORAL | 0 refills | Status: DC | PRN
  Filled 2022-08-29: qty 100, 13d supply, fill #0

## 2022-08-29 NOTE — ED Provider Notes (Signed)
[facility]ER[facility]ENC[facility] DEPT Provider Note  CSN: [id number] Arrival date & time: 08/29/22 7124  Chief Complaint(s) Cough  HPI [name] is a 76 y.o. female with history as below significant for GERD< hld, palpitations, thyroid abnormality who presents with complaint of cough, body aches, low grade temp, congestion. Symptom onset 3 days ago ?exposure to friend with RSV a few days prior Non productive cough, clear rhinorrhea, body aches, no dib, rib pain a/w coughing No fevers or chills No n/v, no change in bowel/bladder function Took apap this am PTA which did help body aches, otherwise no medications for symptoms since onset No hx pulm dz per pt Non smoker    Past Medical History 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 ( Dr Tonia Brooms)   Subclinical hypothyroidism 05/17/2011   Venous insufficiency of right leg 06/23/2017   Follows with vascular   Weakness    Patient Active Problem List   Diagnosis Date Noted   Right flank pain 06/30/2022   Acute cystitis with hematuria 06/30/2022   Acute low back pain 06/30/2022   CAD (coronary artery disease), native coronary artery 04/25/2022   Coronary artery disease involving native coronary artery of native heart without angina pectoris    CKD (chronic kidney disease), stage III ([place]) 02/02/2022   Change in stool 02/02/2022   Chest tightness 02/02/2022   Abnormal bowel habits 05/06/2021   COVID 04/21/2021   Aortic atherosclerosis ([place]) 10/26/2020   Sigmoid diverticulosis 10/26/2020   Occipital neuralgia of right side 03/24/2020   Venous insufficiency of right leg 06/23/2017   Prediabetes 06/21/2016    Hypothyroidism (acquired) 05/17/2011   GERD (gastroesophageal reflux disease) 01/17/2009   Hyperlipidemia 12/03/2007   NEPHROLITHIASIS, HX OF 12/03/2007   Home Medication(s) Prior to Admission medications   Medication Sig Start Date End Date Taking? Authorizing Provider  acetaminophen (TYLENOL) 325 MG tablet Take 2 tablets (650 mg total) by mouth every 6 (six) hours as needed. 08/29/22  Yes Wynona Dove A, DO  fluticasone (FLONASE) 50 MCG/ACT nasal spray Place 1 spray into both nostrils daily for 7 days. 08/29/22 09/05/22 Yes Jeanell Sparrow, DO  guaiFENesin (ROBITUSSIN) 100 MG/5ML liquid Take 5 mLs by mouth every 4 (four) hours as needed for cough or to loosen phlegm. 08/29/22  Yes Wynona Dove A, DO  ibuprofen (ADVIL) 600 MG tablet Take 1 tablet (600 mg total) by mouth every 6 (six) hours as needed. 08/29/22  Yes Wynona Dove A, DO  aspirin 81 MG tablet Take 81 mg by mouth daily.    [provider]  cyclobenzaprine (FLEXERIL) 5 MG tablet Take 1-2 tablets (5-10 mg total) by mouth 3 (three) times daily as needed for muscle spasms. Patient not taking: Reported on 07/26/2022 06/30/22   Binnie Rail, MD  ezetimibe (ZETIA) 10 MG tablet Take 1 tablet (10 mg total) by mouth daily. 05/06/22 05/01/23  Troy Sine, MD  levothyroxine (SYNTHROID) 50 MCG tablet TAKE 1 TABLET BY MOUTH EVERY DAY 11/08/21   Binnie Rail, MD  metoprolol succinate (TOPROL XL) 25 MG 24 hr tablet Take 1 tablet (25 mg total) by  mouth daily. 02/17/22   Lelon Perla, MD  omeprazole (PRILOSEC) 20 MG capsule Take 1 capsule (20 mg total) by mouth daily as needed. Patient taking differently: Take 20 mg by mouth daily. 02/02/22   Binnie Rail, MD  rosuvastatin (CRESTOR) 40 MG tablet Take 1 tablet (40 mg total) by mouth daily. 05/03/22   Almyra Deforest, PA  ticagrelor (BRILINTA) 90 MG TABS tablet Take 1 tablet (90 mg total) by mouth 2 (two) times daily. 05/03/22   Almyra Deforest, PA                                                                                                                                     Past Surgical History Past Surgical History:  Procedure Laterality Date   CHOLECYSTECTOMY N/A 06/13/2017   Procedure: LAPAROSCOPIC CHOLECYSTECTOMY;  Surgeon: Vickie Epley, MD;  Location: [facility];  Service: General;  Laterality: N/A;   COLONOSCOPY     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/25/2022   Procedure: CORONARY STENT INTERVENTION;  Surgeon: Troy Sine, MD;  Location: [facility];  Service: Cardiovascular;  Laterality: N/A;   LEFT HEART CATH AND CORONARY ANGIOGRAPHY N/A 04/25/2022   Procedure: LEFT HEART CATH AND CORONARY ANGIOGRAPHY;  Surgeon: Troy Sine, MD;  Location: [facility];  Service: Cardiovascular;  Laterality: N/A;   MASS EXCISION     right upper inner thigh or cyst   WISDOM TOOTH EXTRACTION     Family History Family History  Problem Relation Age of Onset   COPD Mother        deceased 10-28-08   Alzheimer's disease Father        deceased Aug 28, 2009   Coronary artery disease Father        s/p cabg   Breast cancer Maternal Grandmother    Colon cancer Neg Hx    Colon polyps Neg Hx    Esophageal cancer Neg Hx    Rectal cancer Neg Hx    Stomach cancer Neg Hx     Social History Social History   Tobacco Use   Smoking status: Never   Smokeless tobacco: Never  Vaping Use   Vaping Use: Never used  Substance Use Topics   Alcohol use: No   Drug use: No   Allergies Atorvastatin and Augmentin [amoxicillin-pot clavulanate]  Review of Systems Review of Systems  Constitutional:  Negative for chills and fever.  HENT:  Positive for congestion, rhinorrhea and sneezing. Negative for facial swelling and trouble swallowing.   Eyes:  Negative for photophobia and visual disturbance.  Respiratory:  Positive for cough and chest tightness. Negative for shortness of breath.   Cardiovascular:  Negative for chest pain and palpitations.  Gastrointestinal:  Negative for abdominal pain,  nausea and vomiting.  Endocrine: Negative for polydipsia and polyuria.  Genitourinary:  Negative for difficulty urinating and hematuria.  Musculoskeletal:  Negative for gait problem and joint swelling.  Skin:  Negative for pallor and rash.  Neurological:  Negative for syncope and headaches.  Psychiatric/Behavioral:  Negative for agitation and confusion.     Physical Exam Vital Signs  I have reviewed the triage vital signs BP (!) 157/56   Pulse 85   Temp (!) 101.1 F (38.4 C) (Oral)   Resp 20   Ht '5\' 8"'$  (1.727 m)   Wt 108.9 kg   SpO2 90%   BMI 36.49 kg/m  Physical Exam Vitals and nursing note reviewed.  Constitutional:      General: She is not in acute distress.    Appearance: Normal appearance. She is not ill-appearing, toxic-appearing or diaphoretic.  HENT:     Head: Normocephalic and atraumatic. No raccoon eyes, Battle's sign, right periorbital erythema or left periorbital erythema.     Jaw: There is normal jaw occlusion. No trismus or tenderness.     Right Ear: External ear normal.     Left Ear: External ear normal.     Nose: Nose normal.     Mouth/Throat:     Mouth: Mucous membranes are moist.     Pharynx: Uvula midline. No pharyngeal swelling, oropharyngeal exudate or uvula swelling.     Comments: Cobblestoning to posterior oropharynx Eyes:     General: No scleral icterus.       Right eye: No discharge.        Left eye: No discharge.  Cardiovascular:     Rate and Rhythm: Normal rate and regular rhythm.     Pulses: Normal pulses.     Heart sounds: Normal heart sounds.  Pulmonary:     Effort: Pulmonary effort is normal. No tachypnea, accessory muscle usage or respiratory distress.     Breath sounds: Normal breath sounds. No decreased breath sounds.  Abdominal:     General: Abdomen is flat.     Tenderness: There is no abdominal tenderness.  Musculoskeletal:        General: Normal range of motion.     Cervical back: Normal range of motion.     Right lower leg:  No edema.     Left lower leg: No edema.  Skin:    General: Skin is warm and dry.     Capillary Refill: Capillary refill takes less than 2 seconds.  Neurological:     Mental Status: She is alert.  Psychiatric:        Mood and Affect: Mood normal.        Behavior: Behavior normal.     ED Results and Treatments Labs (all labs ordered are listed, but only abnormal results are displayed) Labs Reviewed  RESP PANEL BY RT-PCR (RSV, FLU A&B, COVID)  RVPGX2 - Abnormal; Notable for the following components:      Result Value   Resp Syncytial Virus by PCR POSITIVE (*)    All other components within normal limits  CBC WITH DIFFERENTIAL/PLATELET - Abnormal; Notable for the following components:   Platelets 135 (*)    All other components within normal limits  BASIC METABOLIC PANEL - Abnormal; Notable for the following components:   Glucose, Bld 122 (*)    All other components within normal limits  Radiology DG Chest Portable 1 View  Result Date: 08/29/2022 CLINICAL DATA:  Cough and congestion. EXAM: PORTABLE CHEST 1 VIEW COMPARISON:  06/12/2017 FINDINGS: The lungs are clear without focal pneumonia, edema, pneumothorax or pleural effusion. Trace atelectasis noted left base. The cardiopericardial silhouette is within normal limits for size. The visualized bony structures of the thorax are unremarkable. Telemetry leads overlie the chest. IMPRESSION: No active disease. Electronically Signed   By: Misty Stanley M.D.   On: 08/29/2022 08:01    Pertinent labs & imaging results that were available during my care of the patient were reviewed by me and considered in my medical decision making (see MDM for details).  Medications Ordered in ED Medications  ipratropium-albuterol (DUONEB) 0.5-2.5 (3) MG/3ML nebulizer solution 3 mL (3 mLs Nebulization Given 08/29/22 0806)  guaiFENesin  (ROBITUSSIN) 100 MG/5ML liquid 5 mL (5 mLs Oral Given 08/29/22 0802)  acetaminophen (TYLENOL) tablet 650 mg (650 mg Oral Given 08/29/22 1030)  ketorolac (TORADOL) 15 MG/ML injection 15 mg (15 mg Intravenous Given 08/29/22 1045)  sodium chloride 0.9 % bolus 1,000 mL (1,000 mLs Intravenous New Bag/Given 08/29/22 1050)                                                                                                                                     Procedures Procedures  (including critical care time)  Medical Decision Making / ED Course   MDM:  [name] is a 76 y.o. female with history as noted above who presents with complaint of uri s/s.  In my evaluation of this patient's dyspnea my DDx includes, but is not limited to, pneumonia, pulmonary embolism, pneumothorax, pulmonary edema, metabolic acidosis, asthma, COPD, cardiac cause, anemia, anxiety, etc.  Differential diagnosis for adult fever includes but is not exclusive to community-acquired pneumonia, urinary tract infection, acute cholecystitis, viral syndrome, cellulitis, tick bourne disease,  decubitus ulcer, necrotizing fasciitis, meningitis, encephalitis, influenza, etc. She has RSV, +sick contact. Her o2 sat is lower than typical but she is feeling much better, no conversational dyspnea, no chest pain. She is feeling much better, ambulatory w/ steady gait. Observation was recommended but patient wants to go home, does not want to be admitted at this time. Advised she f/u with her pcp closely as an outpatient. Discussed supportive care at home. She will RTED if she changes her mind or if symptoms worsen/do not improve.   The patient improved significantly and was discharged in stable condition. Detailed discussions were had with the patient regarding current findings, and need for close f/u with PCP or on call doctor. The patient has been instructed to return immediately if the symptoms worsen in any way for re-evaluation.  Patient verbalized understanding and is in agreement with current care plan. All questions answered prior to discharge.         Additional history obtained: -Additional history obtained from na -External records from outside source obtained and  reviewed including: Chart review including previous notes, labs, imaging, consultation notes including recent primary care notes, (advised to start mucinex which she hasn't started), prior ED visits. Home meds    Lab Tests: -I ordered, reviewed, and interpreted labs.   The pertinent results include:   Labs Reviewed  RESP PANEL BY RT-PCR (RSV, FLU A&B, COVID)  RVPGX2 - Abnormal; Notable for the following components:      Result Value   Resp Syncytial Virus by PCR POSITIVE (*)    All other components within normal limits  CBC WITH DIFFERENTIAL/PLATELET - Abnormal; Notable for the following components:   Platelets 135 (*)    All other components within normal limits  BASIC METABOLIC PANEL - Abnormal; Notable for the following components:   Glucose, Bld 122 (*)    All other components within normal limits    Notable for RSV was positive Labs o/w stable  EKG   EKG Interpretation  Date/Time:  Monday August 29 2022 07:35:48 EST Ventricular Rate:  85 PR Interval:  143 QRS Duration: 72 QT Interval:  361 QTC Calculation: 430 R Axis:   49 Text Interpretation: Sinus rhythm Confirmed by Wynona Dove ([id number]) on 08/29/2022 10:09:16 AM         Imaging Studies ordered: I ordered imaging studies including CXR On my interpretation imaging demonstrates no acute process I independently visualized and interpreted imaging. I agree with the radiologist interpretation   Medicines ordered and prescription drug management: Meds ordered this encounter  Medications   ipratropium-albuterol (DUONEB) 0.5-2.5 (3) MG/3ML nebulizer solution 3 mL   guaiFENesin (ROBITUSSIN) 100 MG/5ML liquid 5 mL   acetaminophen (TYLENOL) tablet 650 mg   ketorolac  (TORADOL) 15 MG/ML injection 15 mg   sodium chloride 0.9 % bolus 1,000 mL   guaiFENesin (ROBITUSSIN) 100 MG/5ML liquid    Sig: Take 5 mLs by mouth every 4 (four) hours as needed for cough or to loosen phlegm.    Dispense:  120 mL    Refill:  0   acetaminophen (TYLENOL) 325 MG tablet    Sig: Take 2 tablets (650 mg total) by mouth every 6 (six) hours as needed.    Dispense:  100 tablet    Refill:  0   ibuprofen (ADVIL) 600 MG tablet    Sig: Take 1 tablet (600 mg total) by mouth every 6 (six) hours as needed.    Dispense:  30 tablet    Refill:  0   fluticasone (FLONASE) 50 MCG/ACT nasal spray    Sig: Place 1 spray into both nostrils daily for 7 days.    Dispense:  16 g    Refill:  0    -I have reviewed the patients home medicines and have made adjustments as needed   Consultations Obtained: na   Cardiac Monitoring: Continuous pulse oximetry   Social Determinants of Health:  Diagnosis or treatment significantly limited by social determinants of health:  Social History   Tobacco Use   Smoking status: Never   Smokeless tobacco: Never  Vaping Use   Vaping Use: Never used  Substance Use Topics   Alcohol use: No   Drug use: No      Reevaluation: After the interventions noted above, I reevaluated the patient and found that they have improved  Co morbidities that complicate the patient evaluation  Past Medical History:  Diagnosis Date   Anxiety and depression 09/11/2016   Borderline diabetes mellitus    Chest pain    Elevated TSH 06/23/2017  GERD (gastroesophageal reflux disease)    Grief reaction    Hyperlipidemia    Palpitations    Personal history of urinary calculi    Prediabetes 06/21/2016   Squamous cell skin cancer    bridge of nose ( Dr Tonia Brooms)   Subclinical hypothyroidism 05/17/2011   Venous insufficiency of right leg 06/23/2017   Follows with vascular   Weakness       Dispostion: Disposition decision including need for hospitalization was considered,  and patient discharged from emergency department.    Final Clinical Impression(s) / ED Diagnoses Final diagnoses:  RSV (acute bronchiolitis due to respiratory syncytial virus)     This chart was dictated using voice recognition software.  Despite best efforts to proofread,  errors can occur which can change the documentation meaning.    Jeanell Sparrow, DO 08/29/22 1224

## 2022-08-29 NOTE — ED Triage Notes (Signed)
Patient arrives ambulatory by POV c/o started Friday afternoon having cough, chest congestion and generalized body aches. Patient reports exposed to friend last week with RSV.

## 2022-08-29 NOTE — ED Notes (Signed)
Patient verbalizes understanding of discharge instructions. Opportunity for questioning and answers were provided. Patient discharged from ED.  °

## 2022-08-29 NOTE — ED Notes (Signed)
RT note: Pt. seen shortly after arrival with 91-92% sats while on room air, n/c s/u, asked to take couple deep breath while on room air and noted sats >'d to 93-94% then returning to original baseline, given HHN aerosol tx. with X1 Duoneb per order and  afterwards I/S given with pt. Educated on due to LLL Atelectasis noted on CXR, instructed/educated on both, RT to follow.

## 2022-08-29 NOTE — Discharge Instructions (Addendum)
It was a pleasure caring for you today in the emergency department.  Please return if symptoms worsen, breathing worsens, if not improving.  Please return to the emergency department for any worsening or worrisome symptoms.

## 2022-09-01 DIAGNOSIS — J210 Acute bronchiolitis due to respiratory syncytial virus: Secondary | ICD-10-CM | POA: Insufficient documentation

## 2022-09-01 NOTE — Patient Instructions (Addendum)
      You had a treatment today of albuterol    Medications changes include :   cough syrup. Prednisone and albuterol inhaler.    Return if symptoms worsen or fail to improve.

## 2022-09-01 NOTE — Progress Notes (Unsigned)
Subjective:    Patient ID: [name], female    DOB: 18-Mar-1946, 76 y.o.   MRN: [id number]     HPI [name] is here for follow up from the hospital  Went to ED 12/4 for cough, body aches, low grade temp, congestion.   She was exposed to a friend with RSV.   Flu, covid negative.  RSV positive.  CXR w/o active disease.  BP slightly elevated, temp 101.1, O2 sat 90%  Coughing all day - bringing up mucus Blowing nose a lot - lots of mucus Gets SOB with activity Concerned she is not getting better   Tylenol, advil, robitussin, floanse  Medications and allergies reviewed with patient and updated if appropriate.  Current Outpatient Medications on File Prior to Visit  Medication Sig Dispense Refill   acetaminophen (TYLENOL) 325 MG tablet Take 2 tablets (650 mg total) by mouth every 6 (six) hours as needed. 100 tablet 0   aspirin 81 MG tablet Take 81 mg by mouth daily.     cyclobenzaprine (FLEXERIL) 5 MG tablet Take 1-2 tablets (5-10 mg total) by mouth 3 (three) times daily as needed for muscle spasms. 30 tablet 0   ezetimibe (ZETIA) 10 MG tablet Take 1 tablet (10 mg total) by mouth daily. 90 tablet 3   fluticasone (FLONASE) 50 MCG/ACT nasal spray Place 1 spray into both nostrils daily for 7 days. 16 g 0   guaiFENesin (ROBITUSSIN) 100 MG/5ML liquid Take 5 mLs by mouth every 4 (four) hours as needed for cough or to loosen phlegm. 120 mL 0   ibuprofen (ADVIL) 600 MG tablet Take 1 tablet (600 mg total) by mouth every 6 (six) hours as needed. 30 tablet 0   levothyroxine (SYNTHROID) 50 MCG tablet TAKE 1 TABLET BY MOUTH EVERY DAY 90 tablet 3   metoprolol succinate (TOPROL XL) 25 MG 24 hr tablet Take 1 tablet (25 mg total) by mouth daily. 90 tablet 3   omeprazole (PRILOSEC) 20 MG capsule Take 1 capsule (20 mg total) by mouth daily as needed. (Patient taking differently: Take 20 mg by mouth daily.) 90 capsule 3   rosuvastatin (CRESTOR) 40 MG tablet Take 1 tablet (40 mg total) by  mouth daily. 90 tablet 3   ticagrelor (BRILINTA) 90 MG TABS tablet Take 1 tablet (90 mg total) by mouth 2 (two) times daily. 60 tablet 11   No current facility-administered medications on file prior to visit.     Review of Systems  Constitutional:  Positive for fever.  HENT:  Positive for congestion. Negative for ear pain, sinus pain and sore throat.   Respiratory:  Positive for cough, shortness of breath and wheezing. Negative for chest tightness.   Cardiovascular:  Positive for palpitations.  Musculoskeletal:  Positive for myalgias (mild).  Neurological:  Negative for dizziness, light-headedness and headaches.       Objective:   Vitals:   09/02/22 1440  BP: 130/68  Pulse: 80  Temp: 99.4 F (37.4 C)  SpO2: 94%   BP Readings from Last 3 Encounters:  09/02/22 130/68  08/29/22 (!) 112/59  07/26/22 (!) 122/58   Wt Readings from Last 3 Encounters:  09/02/22 244 lb (110.7 kg)  08/29/22 240 lb (108.9 kg)  07/28/22 244 lb 12.8 oz (111 kg)   Body mass index is 37.1 kg/m.    Physical Exam Constitutional:      General: She is not in acute distress.    Appearance: Normal appearance. She is not  ill-appearing.  HENT:     Head: Normocephalic and atraumatic.     Right Ear: Tympanic membrane, ear canal and external ear normal.     Left Ear: Tympanic membrane, ear canal and external ear normal.     Nose: Congestion present.     Mouth/Throat:     Mouth: Mucous membranes are moist.     Pharynx: No oropharyngeal exudate or posterior oropharyngeal erythema.  Eyes:     Conjunctiva/sclera: Conjunctivae normal.  Cardiovascular:     Rate and Rhythm: Normal rate and regular rhythm.  Pulmonary:     Effort: Pulmonary effort is normal. No respiratory distress.     Breath sounds: Wheezing (mild at end expiration - lung bases) and rales (mild at end expiration - lung bases) present.     Comments: Frequent coughing Musculoskeletal:     Cervical back: Neck supple. No tenderness.   Lymphadenopathy:     Cervical: No cervical adenopathy.  Skin:    General: Skin is warm and dry.  Neurological:     Mental Status: She is alert.        Lab Results  Component Value Date   WBC 9.2 08/29/2022   HGB 13.4 08/29/2022   HCT 41.0 08/29/2022   PLT 135 (L) 08/29/2022   GLUCOSE 122 (H) 08/29/2022   CHOL 120 05/25/2022   TRIG 68 05/25/2022   HDL 56 05/25/2022   LDLDIRECT 152.1 06/26/2013   LDLCALC 50 05/25/2022   ALT 49 (H) 07/26/2022   AST 37 07/26/2022   NA 135 08/29/2022   K 4.2 08/29/2022   CL 99 08/29/2022   CREATININE 0.91 08/29/2022   BUN 13 08/29/2022   CO2 25 08/29/2022   TSH 2.99 01/31/2022   HGBA1C 5.8 01/31/2022   MICROALBUR 0.9 06/26/2013   DG Chest Portable 1 View CLINICAL DATA:  Cough and congestion.  EXAM: PORTABLE CHEST 1 VIEW  COMPARISON:  06/12/2017  FINDINGS: The lungs are clear without focal pneumonia, edema, pneumothorax or pleural effusion. Trace atelectasis noted left base. The cardiopericardial silhouette is within normal limits for size. The visualized bony structures of the thorax are unremarkable. Telemetry leads overlie the chest.  IMPRESSION: No active disease.  Electronically Signed   By: Misty Stanley M.D.   On: 08/29/2022 08:01    Assessment & Plan:    See Problem List for Assessment and Plan of chronic medical problems.

## 2022-09-02 ENCOUNTER — Encounter: Payer: Self-pay | Admitting: Internal Medicine

## 2022-09-02 ENCOUNTER — Telehealth: Payer: Self-pay

## 2022-09-02 ENCOUNTER — Ambulatory Visit (INDEPENDENT_AMBULATORY_CARE_PROVIDER_SITE_OTHER): Payer: PPO | Admitting: Internal Medicine

## 2022-09-02 VITALS — BP 130/68 | HR 80 | Temp 99.4°F | Ht 68.0 in | Wt 244.0 lb

## 2022-09-02 DIAGNOSIS — J210 Acute bronchiolitis due to respiratory syncytial virus: Secondary | ICD-10-CM | POA: Diagnosis not present

## 2022-09-02 MED ORDER — HYDROCODONE BIT-HOMATROP MBR 5-1.5 MG/5ML PO SOLN: 5.0000 mL | Freq: Three times a day (TID) | ORAL | 0 refills | Status: DC | PRN

## 2022-09-02 MED ORDER — ALBUTEROL SULFATE HFA 108 (90 BASE) MCG/ACT IN AERS: 2.0000 | INHALATION_SPRAY | Freq: Four times a day (QID) | RESPIRATORY_TRACT | 0 refills | Status: DC | PRN

## 2022-09-02 MED ORDER — PREDNISONE 20 MG PO TABS: 40.0000 mg | ORAL_TABLET | Freq: Every day | ORAL | 0 refills | Status: DC

## 2022-09-02 NOTE — Telephone Encounter (Signed)
     Patient  visit on 12/4  at [facility]  Have you been able to follow up with your primary care physician? Yes   The patient was or was not able to obtain any needed medicine or equipment. Yes   Are there diet recommendations that you are having difficulty following? Na   Patient expresses understanding of discharge instructions and education provided has no other needs at this time.  Yes      [facility]  [phone] [address], [place], [place] [address] Phone: [phone] Email: [email or web address]

## 2022-09-03 NOTE — Assessment & Plan Note (Signed)
Acute Cxr in ED neg for PNA Symptoms still moderate in nature Albuterol/ ipratropium neb treatment today here Albuterol inhaler Prednisone 40 mg daily x 5 days  Hycodan cough syrup Rest, fluids Reassured symptoms will improve but may last 7-14 days

## 2022-09-04 ENCOUNTER — Inpatient Hospital Stay
Admission: EM | Admit: 2022-09-04 | Discharge: 2022-09-08 | DRG: 871 | Disposition: A | Discharge status: Home / Self Care | Payer: PPO | Attending: Internal Medicine | Admitting: Internal Medicine

## 2022-09-04 ENCOUNTER — Emergency Department: Payer: PPO

## 2022-09-04 DIAGNOSIS — A419 Sepsis, unspecified organism: Principal | ICD-10-CM

## 2022-09-04 DIAGNOSIS — N179 Acute kidney failure, unspecified: Secondary | ICD-10-CM

## 2022-09-04 DIAGNOSIS — I872 Venous insufficiency (chronic) (peripheral): Secondary | ICD-10-CM | POA: Diagnosis not present

## 2022-09-04 DIAGNOSIS — I2510 Atherosclerotic heart disease of native coronary artery without angina pectoris: Secondary | ICD-10-CM

## 2022-09-04 DIAGNOSIS — J210 Acute bronchiolitis due to respiratory syncytial virus: Secondary | ICD-10-CM | POA: Diagnosis not present

## 2022-09-04 DIAGNOSIS — E876 Hypokalemia: Secondary | ICD-10-CM | POA: Diagnosis not present

## 2022-09-04 DIAGNOSIS — Z79890 Hormone replacement therapy (postmenopausal): Secondary | ICD-10-CM

## 2022-09-04 DIAGNOSIS — R7401 Elevation of levels of liver transaminase levels: Secondary | ICD-10-CM | POA: Diagnosis not present

## 2022-09-04 DIAGNOSIS — Z888 Allergy status to other drugs, medicaments and biological substances status: Secondary | ICD-10-CM | POA: Diagnosis not present

## 2022-09-04 DIAGNOSIS — F32A Depression, unspecified: Secondary | ICD-10-CM | POA: Diagnosis present

## 2022-09-04 DIAGNOSIS — Z820 Family history of epilepsy and other diseases of the nervous system: Secondary | ICD-10-CM | POA: Diagnosis not present

## 2022-09-04 DIAGNOSIS — J121 Respiratory syncytial virus pneumonia: Secondary | ICD-10-CM | POA: Diagnosis present

## 2022-09-04 DIAGNOSIS — E039 Hypothyroidism, unspecified: Secondary | ICD-10-CM | POA: Diagnosis present

## 2022-09-04 DIAGNOSIS — Z9861 Coronary angioplasty status: Secondary | ICD-10-CM | POA: Diagnosis not present

## 2022-09-04 DIAGNOSIS — Z803 Family history of malignant neoplasm of breast: Secondary | ICD-10-CM

## 2022-09-04 DIAGNOSIS — Z85828 Personal history of other malignant neoplasm of skin: Secondary | ICD-10-CM

## 2022-09-04 DIAGNOSIS — Z79899 Other long term (current) drug therapy: Secondary | ICD-10-CM

## 2022-09-04 DIAGNOSIS — Z1152 Encounter for screening for COVID-19: Secondary | ICD-10-CM | POA: Diagnosis not present

## 2022-09-04 DIAGNOSIS — J159 Unspecified bacterial pneumonia: Secondary | ICD-10-CM | POA: Diagnosis present

## 2022-09-04 DIAGNOSIS — E785 Hyperlipidemia, unspecified: Secondary | ICD-10-CM | POA: Diagnosis present

## 2022-09-04 DIAGNOSIS — E119 Type 2 diabetes mellitus without complications: Secondary | ICD-10-CM | POA: Diagnosis present

## 2022-09-04 DIAGNOSIS — Z7982 Long term (current) use of aspirin: Secondary | ICD-10-CM

## 2022-09-04 DIAGNOSIS — E7800 Pure hypercholesterolemia, unspecified: Secondary | ICD-10-CM | POA: Diagnosis not present

## 2022-09-04 DIAGNOSIS — R6520 Severe sepsis without septic shock: Secondary | ICD-10-CM | POA: Diagnosis present

## 2022-09-04 DIAGNOSIS — Z8249 Family history of ischemic heart disease and other diseases of the circulatory system: Secondary | ICD-10-CM | POA: Diagnosis not present

## 2022-09-04 DIAGNOSIS — K219 Gastro-esophageal reflux disease without esophagitis: Secondary | ICD-10-CM | POA: Diagnosis not present

## 2022-09-04 DIAGNOSIS — Z880 Allergy status to penicillin: Secondary | ICD-10-CM

## 2022-09-04 DIAGNOSIS — J9601 Acute respiratory failure with hypoxia: Secondary | ICD-10-CM | POA: Insufficient documentation

## 2022-09-04 DIAGNOSIS — F419 Anxiety disorder, unspecified: Secondary | ICD-10-CM | POA: Diagnosis not present

## 2022-09-04 DIAGNOSIS — Z825 Family history of asthma and other chronic lower respiratory diseases: Secondary | ICD-10-CM

## 2022-09-04 DIAGNOSIS — R059 Cough, unspecified: Secondary | ICD-10-CM | POA: Diagnosis not present

## 2022-09-04 DIAGNOSIS — Z7952 Long term (current) use of systemic steroids: Secondary | ICD-10-CM

## 2022-09-04 DIAGNOSIS — R0602 Shortness of breath: Secondary | ICD-10-CM | POA: Diagnosis not present

## 2022-09-04 DIAGNOSIS — E1169 Type 2 diabetes mellitus with other specified complication: Secondary | ICD-10-CM | POA: Diagnosis present

## 2022-09-04 LAB — COMPREHENSIVE METABOLIC PANEL
ALT: 55 U/L — ABNORMAL HIGH (ref 0–44)
AST: 68 U/L — ABNORMAL HIGH (ref 15–41)
Albumin: 2.6 g/dL — ABNORMAL LOW (ref 3.5–5.0)
Alkaline Phosphatase: 83 U/L (ref 38–126)
Anion gap: 10 (ref 5–15)
BUN: 37 mg/dL — ABNORMAL HIGH (ref 8–23)
CO2: 21 mmol/L — ABNORMAL LOW (ref 22–32)
Calcium: 8.7 mg/dL — ABNORMAL LOW (ref 8.9–10.3)
Chloride: 102 mmol/L (ref 98–111)
Creatinine, Ser: 3.11 mg/dL — ABNORMAL HIGH (ref 0.44–1.00)
GFR, Estimated: 15 mL/min — ABNORMAL LOW (ref >60)
Glucose, Bld: 184 mg/dL — ABNORMAL HIGH (ref 70–99)
Potassium: 3.6 mmol/L (ref 3.5–5.1)
Sodium: 133 mmol/L — ABNORMAL LOW (ref 135–145)
Total Bilirubin: 0.6 mg/dL (ref 0.3–1.2)
Total Protein: 6.8 g/dL (ref 6.5–8.1)

## 2022-09-04 LAB — BRAIN NATRIURETIC PEPTIDE: B Natriuretic Peptide: 277.8 pg/mL — ABNORMAL HIGH (ref 0.0–100.0)

## 2022-09-04 LAB — TROPONIN I (HIGH SENSITIVITY): Troponin I (High Sensitivity): 58 ng/L — ABNORMAL HIGH (ref <18)

## 2022-09-04 MED ORDER — MOXIFLOXACIN HCL 0.5 % OP SOLN
1.0000 [drp] | Freq: Three times a day (TID) | OPHTHALMIC | Status: DC
  Administered 2022-09-05 – 2022-09-08 (×11): 1 [drp] via OPHTHALMIC
  Filled 2022-09-04 (×2): qty 3

## 2022-09-04 MED ORDER — SODIUM CHLORIDE 0.9 % IV SOLN: 2.0000 g | Freq: Once | INTRAVENOUS | Status: DC

## 2022-09-04 MED ORDER — SODIUM CHLORIDE 0.9 % IV BOLUS
1000.0000 mL | Freq: Once | INTRAVENOUS | Status: AC
  Administered 2022-09-05: 1000 mL via INTRAVENOUS

## 2022-09-04 MED ORDER — SODIUM CHLORIDE 0.9 % IV SOLN: 500.0000 mg | Freq: Once | INTRAVENOUS | Status: DC

## 2022-09-04 NOTE — ED Triage Notes (Signed)
Pt was dx with RSV a week ago. Pt sts that I am just not getting better. Pt sts that she has been coughing and just not feeling well.

## 2022-09-04 NOTE — ED Provider Notes (Signed)
[facility] Provider Note    Event Date/Time   First MD Initiated Contact with Patient 09/04/22 2303     (approximate)   History   Cough   HPI  [name] is a 76 y.o. female  here with cough, SOB. Pt was diagnosed with RSV  12/4. Since then she has had persistent cough, SOB, and fatigue. She went to her pcp 12/8 and was started on prednisone and albuterol. Since then, she's had mild worsening of sx with increasing and now severe SOB. She has had fevers, sputum production, general fatigue. No vomiting. No nausea. She feels weak, tired.      Physical Exam   Triage Vital Signs: ED Triage Vitals  Enc Vitals Group     BP 09/04/22 2140 (!) 153/73     Pulse Rate 09/04/22 2140 88     Resp 09/04/22 2140 19     Temp 09/04/22 2140 98.9 F (37.2 C)     Temp Source 09/04/22 2140 Oral     SpO2 09/04/22 2140 (!) 86 %     Weight 09/04/22 2144 250 lb (113.4 kg)     Height --      Head Circumference --      Peak Flow --      Pain Score --      Pain Loc --      Pain Edu? --      Excl. in [place]? --     Most recent vital signs: Vitals:   09/04/22 2153 09/05/22 0229  BP:  137/61  Pulse:  94  Resp:  20  Temp:  98 F (36.7 C)  SpO2: 98% 100%     General: Awake, no distress.  CV:  Good peripheral perfusion. RRR. No rmurmurs. Resp:  Tachypnea with bibasilar rales/rhonchi and increased WOB. Minimal wheezes. Abd:  No distention. No tenderness. Other:  No edema.   ED Results / Procedures / Treatments   Labs (all labs ordered are listed, but only abnormal results are displayed) Labs Reviewed  RESP PANEL BY RT-PCR (RSV, FLU A&B, COVID)  RVPGX2 - Abnormal; Notable for the following components:      Result Value   Resp Syncytial Virus by PCR POSITIVE (*)    All other components within normal limits  CBC WITH DIFFERENTIAL/PLATELET - Abnormal; Notable for the following components:   WBC 18.1 (*)    Hemoglobin 11.8 (*)    Neutro Abs 15.5 (*)     Monocytes Absolute 1.2 (*)    Abs Immature Granulocytes 0.22 (*)    All other components within normal limits  COMPREHENSIVE METABOLIC PANEL - Abnormal; Notable for the following components:   Sodium 133 (*)    CO2 21 (*)    Glucose, Bld 184 (*)    BUN 37 (*)    Creatinine, Ser 3.11 (*)    Calcium 8.7 (*)    Albumin 2.6 (*)    AST 68 (*)    ALT 55 (*)    GFR, Estimated 15 (*)    All other components within normal limits  BRAIN NATRIURETIC PEPTIDE - Abnormal; Notable for the following components:   B Natriuretic Peptide 277.8 (*)    All other components within normal limits  TROPONIN I (HIGH SENSITIVITY) - Abnormal; Notable for the following components:   Troponin I (High Sensitivity) 58 (*)    All other components within normal limits  CULTURE, BLOOD (ROUTINE X 2)  CULTURE, BLOOD (ROUTINE X 2)  LACTIC ACID, PLASMA  PROCALCITONIN  PROCALCITONIN  COMPREHENSIVE METABOLIC PANEL  CBC  LEGIONELLA PNEUMOPHILA SEROGP 1 UR AG  STREP PNEUMONIAE URINARY ANTIGEN     EKG    RADIOLOGY CXR: Streaky and patchy retrocardiac opacities with ill defined opacity in R infrahilar region, vascular congestion   I also independently reviewed and agree with radiologist interpretations.   PROCEDURES:  Critical Care performed: Yes, see critical care procedure note(s)  .Critical Care  Performed by: Duffy Bruce, MD Authorized by: Duffy Bruce, MD   Critical care provider statement:    Critical care time (minutes):  30   Critical care time was exclusive of:  Separately billable procedures and treating other patients   Critical care was necessary to treat or prevent imminent or life-threatening deterioration of the following conditions:  Cardiac failure, circulatory failure and respiratory failure   Critical care was time spent personally by me on the following activities:  Development of treatment plan with patient or surrogate, discussions with consultants, evaluation of patient's  response to treatment, examination of patient, ordering and review of laboratory studies, ordering and review of radiographic studies, ordering and performing treatments and interventions, pulse oximetry, re-evaluation of patient's condition and review of old charts   I assumed direction of critical care for this patient from another provider in my specialty: no     Care discussed with: admitting provider       [place] [facility]: Medications  moxifloxacin (VIGAMOX) 0.5 % ophthalmic solution 1 drop (1 drop Right Eye Given 09/05/22 0224)  aspirin EC tablet 81 mg (has no administration in time range)  metoprolol succinate (TOPROL-XL) 24 hr tablet 25 mg (has no administration in time range)  ezetimibe (ZETIA) tablet 10 mg (has no administration in time range)  rosuvastatin (CRESTOR) tablet 40 mg (has no administration in time range)  levothyroxine (SYNTHROID) tablet 50 mcg (has no administration in time range)  ticagrelor (BRILINTA) tablet 90 mg (90 mg Oral Not Given 09/05/22 0416)  cyclobenzaprine (FLEXERIL) tablet 5-10 mg (has no administration in time range)  albuterol (PROVENTIL) (2.5 MG/3ML) 0.083% nebulizer solution 2.5 mg (2.5 mg Inhalation Given 09/05/22 0149)  fluticasone (FLONASE) 50 MCG/ACT nasal spray 1 spray (has no administration in time range)  guaiFENesin (ROBITUSSIN) 100 MG/5ML liquid 5 mL (has no administration in time range)  HYDROcodone bit-homatropine (HYCODAN) 5-1.5 MG/5ML syrup 5 mL (has no administration in time range)  pantoprazole (PROTONIX) EC tablet 40 mg (has no administration in time range)  enoxaparin (LOVENOX) injection 40 mg (has no administration in time range)  dextrose 5 % in lactated ringers infusion ( Intravenous New Bag/Given 09/05/22 0416)  cefTRIAXone (ROCEPHIN) 2 g in sodium chloride 0.9 % 100 mL IVPB (0 g Intravenous Stopped 09/05/22 0209)  azithromycin (ZITHROMAX) 500 mg in sodium chloride 0.9 % 250 mL IVPB (0 mg Intravenous Stopped 09/05/22  0246)  sodium chloride 0.9 % bolus 1,000 mL (0 mLs Intravenous Stopped 09/05/22 0234)     IMPRESSION / MDM / [place] / ED COURSE  I reviewed the triage vital signs and the nursing notes.                              Differential diagnosis includes, but is not limited to, superimposed PNA, ongoing RSV, PE, CHF, ACS, anemia.  Patient's presentation is most consistent with acute presentation with potential threat to life or bodily function.  76 yo F with PMHx HLD, GERD, CKD,  CAD, here with respiratory distress, hypoxia. Pt markedly hypoxic on arrival requiring 15L NRB. CXR shows likely superimposed PNA. Labs reviewed, show significant leukocytosis of 18,000.  Lactic acid is normal.  Procalcitonin positive at 0.63.  This along with her chest x-ray suggest possible superimposed bacterial pneumonia and she has been given IV broad-spectrum antibiotics.  BNP and troponin both slightly elevated but suspect this is related to her underlying illness and demand, no chest pain, no signs of failure clinically..  Will start on empiric ABX, IVF, and supplemental O2. BCx sent. Admit to medicine.  FINAL CLINICAL IMPRESSION(S) / ED DIAGNOSES   Final diagnoses:  RSV bronchiolitis  Acute respiratory failure with hypoxemia ([place])     Rx / DC Orders   ED Discharge Orders     None        Note:  This document was prepared using Dragon voice recognition software and may include unintentional dictation errors.   Duffy Bruce, MD 09/05/22 [phone]

## 2022-09-05 ENCOUNTER — Encounter: Payer: Self-pay | Admitting: Internal Medicine

## 2022-09-05 ENCOUNTER — Other Ambulatory Visit: Payer: Self-pay

## 2022-09-05 DIAGNOSIS — J159 Unspecified bacterial pneumonia: Secondary | ICD-10-CM | POA: Diagnosis present

## 2022-09-05 DIAGNOSIS — J9601 Acute respiratory failure with hypoxia: Secondary | ICD-10-CM | POA: Diagnosis present

## 2022-09-05 DIAGNOSIS — A419 Sepsis, unspecified organism: Secondary | ICD-10-CM | POA: Diagnosis present

## 2022-09-05 DIAGNOSIS — Z9861 Coronary angioplasty status: Secondary | ICD-10-CM

## 2022-09-05 DIAGNOSIS — J121 Respiratory syncytial virus pneumonia: Secondary | ICD-10-CM | POA: Diagnosis present

## 2022-09-05 DIAGNOSIS — R7401 Elevation of levels of liver transaminase levels: Secondary | ICD-10-CM

## 2022-09-05 DIAGNOSIS — J210 Acute bronchiolitis due to respiratory syncytial virus: Secondary | ICD-10-CM | POA: Diagnosis present

## 2022-09-05 DIAGNOSIS — Z8249 Family history of ischemic heart disease and other diseases of the circulatory system: Secondary | ICD-10-CM | POA: Diagnosis not present

## 2022-09-05 DIAGNOSIS — F419 Anxiety disorder, unspecified: Secondary | ICD-10-CM | POA: Diagnosis present

## 2022-09-05 DIAGNOSIS — F32A Depression, unspecified: Secondary | ICD-10-CM | POA: Diagnosis present

## 2022-09-05 DIAGNOSIS — Z85828 Personal history of other malignant neoplasm of skin: Secondary | ICD-10-CM | POA: Diagnosis not present

## 2022-09-05 DIAGNOSIS — N179 Acute kidney failure, unspecified: Secondary | ICD-10-CM

## 2022-09-05 DIAGNOSIS — Z888 Allergy status to other drugs, medicaments and biological substances status: Secondary | ICD-10-CM | POA: Diagnosis not present

## 2022-09-05 DIAGNOSIS — E785 Hyperlipidemia, unspecified: Secondary | ICD-10-CM | POA: Diagnosis present

## 2022-09-05 DIAGNOSIS — R6520 Severe sepsis without septic shock: Secondary | ICD-10-CM | POA: Diagnosis present

## 2022-09-05 DIAGNOSIS — E7800 Pure hypercholesterolemia, unspecified: Secondary | ICD-10-CM

## 2022-09-05 DIAGNOSIS — I2510 Atherosclerotic heart disease of native coronary artery without angina pectoris: Secondary | ICD-10-CM | POA: Diagnosis present

## 2022-09-05 DIAGNOSIS — K219 Gastro-esophageal reflux disease without esophagitis: Secondary | ICD-10-CM | POA: Diagnosis present

## 2022-09-05 DIAGNOSIS — Z803 Family history of malignant neoplasm of breast: Secondary | ICD-10-CM | POA: Diagnosis not present

## 2022-09-05 DIAGNOSIS — Z825 Family history of asthma and other chronic lower respiratory diseases: Secondary | ICD-10-CM | POA: Diagnosis not present

## 2022-09-05 DIAGNOSIS — E039 Hypothyroidism, unspecified: Secondary | ICD-10-CM | POA: Diagnosis present

## 2022-09-05 DIAGNOSIS — E119 Type 2 diabetes mellitus without complications: Secondary | ICD-10-CM | POA: Diagnosis present

## 2022-09-05 DIAGNOSIS — E876 Hypokalemia: Secondary | ICD-10-CM | POA: Diagnosis not present

## 2022-09-05 DIAGNOSIS — I872 Venous insufficiency (chronic) (peripheral): Secondary | ICD-10-CM | POA: Diagnosis present

## 2022-09-05 DIAGNOSIS — Z1152 Encounter for screening for COVID-19: Secondary | ICD-10-CM | POA: Diagnosis not present

## 2022-09-05 DIAGNOSIS — Z820 Family history of epilepsy and other diseases of the nervous system: Secondary | ICD-10-CM | POA: Diagnosis not present

## 2022-09-05 LAB — CBC
HCT: 32.5 % — ABNORMAL LOW (ref 36.0–46.0)
Hemoglobin: 10.5 g/dL — ABNORMAL LOW (ref 12.0–15.0)
MCH: 28.8 pg (ref 26.0–34.0)
MCHC: 32.3 g/dL (ref 30.0–36.0)
MCV: 89.3 fL (ref 80.0–100.0)
Platelets: 166 10*3/uL (ref 150–400)
RBC: 3.64 MIL/uL — ABNORMAL LOW (ref 3.87–5.11)
RDW: 13.3 % (ref 11.5–15.5)
WBC: 19.4 10*3/uL — ABNORMAL HIGH (ref 4.0–10.5)
nRBC: 0 % (ref 0.0–0.2)

## 2022-09-05 LAB — CBC WITH DIFFERENTIAL/PLATELET
Abs Immature Granulocytes: 0.22 10*3/uL — ABNORMAL HIGH (ref 0.00–0.07)
Basophils Absolute: 0.1 10*3/uL (ref 0.0–0.1)
Basophils Relative: 1 %
Eosinophils Absolute: 0 10*3/uL (ref 0.0–0.5)
Eosinophils Relative: 0 %
HCT: 36.3 % (ref 36.0–46.0)
Hemoglobin: 11.8 g/dL — ABNORMAL LOW (ref 12.0–15.0)
Immature Granulocytes: 1 %
Lymphocytes Relative: 5 %
Lymphs Abs: 1 10*3/uL (ref 0.7–4.0)
MCH: 28.7 pg (ref 26.0–34.0)
MCHC: 32.5 g/dL (ref 30.0–36.0)
MCV: 88.3 fL (ref 80.0–100.0)
Monocytes Absolute: 1.2 10*3/uL — ABNORMAL HIGH (ref 0.1–1.0)
Monocytes Relative: 7 %
Neutro Abs: 15.5 10*3/uL — ABNORMAL HIGH (ref 1.7–7.7)
Neutrophils Relative %: 86 %
Platelets: 206 10*3/uL (ref 150–400)
RBC: 4.11 MIL/uL (ref 3.87–5.11)
RDW: 13.5 % (ref 11.5–15.5)
Smear Review: NORMAL
WBC: 18.1 10*3/uL — ABNORMAL HIGH (ref 4.0–10.5)
nRBC: 0 % (ref 0.0–0.2)

## 2022-09-05 LAB — LACTIC ACID, PLASMA: Lactic Acid, Venous: 1.4 mmol/L (ref 0.5–1.9)

## 2022-09-05 LAB — PROCALCITONIN
Procalcitonin: 0.63 ng/mL
Procalcitonin: 0.36 ng/mL

## 2022-09-05 LAB — RESP PANEL BY RT-PCR (RSV, FLU A&B, COVID)  RVPGX2
Influenza A by PCR: NEGATIVE
Influenza B by PCR: NEGATIVE
Resp Syncytial Virus by PCR: POSITIVE — AB
SARS Coronavirus 2 by RT PCR: NEGATIVE

## 2022-09-05 LAB — COMPREHENSIVE METABOLIC PANEL
ALT: 45 U/L — ABNORMAL HIGH (ref 0–44)
AST: 51 U/L — ABNORMAL HIGH (ref 15–41)
Albumin: 2.2 g/dL — ABNORMAL LOW (ref 3.5–5.0)
Alkaline Phosphatase: 76 U/L (ref 38–126)
Anion gap: 10 (ref 5–15)
BUN: 37 mg/dL — ABNORMAL HIGH (ref 8–23)
CO2: 21 mmol/L — ABNORMAL LOW (ref 22–32)
Calcium: 7.9 mg/dL — ABNORMAL LOW (ref 8.9–10.3)
Chloride: 105 mmol/L (ref 98–111)
Creatinine, Ser: 3 mg/dL — ABNORMAL HIGH (ref 0.44–1.00)
GFR, Estimated: 16 mL/min — ABNORMAL LOW (ref >60)
Glucose, Bld: 193 mg/dL — ABNORMAL HIGH (ref 70–99)
Potassium: 3.8 mmol/L (ref 3.5–5.1)
Sodium: 136 mmol/L (ref 135–145)
Total Bilirubin: 0.5 mg/dL (ref 0.3–1.2)
Total Protein: 6.2 g/dL — ABNORMAL LOW (ref 6.5–8.1)

## 2022-09-05 LAB — STREP PNEUMONIAE URINARY ANTIGEN: Strep Pneumo Urinary Antigen: NEGATIVE

## 2022-09-05 MED ORDER — ROSUVASTATIN CALCIUM 10 MG PO TABS
10.0000 mg | ORAL_TABLET | Freq: Every day | ORAL | Status: DC
  Administered 2022-09-05 – 2022-09-07 (×3): 10 mg via ORAL
  Filled 2022-09-05 (×3): qty 1

## 2022-09-05 MED ORDER — LACTATED RINGERS IV SOLN: INTRAVENOUS | Status: DC

## 2022-09-05 MED ORDER — METHYLPREDNISOLONE SODIUM SUCC 40 MG IJ SOLR
40.0000 mg | Freq: Every day | INTRAMUSCULAR | Status: DC
  Administered 2022-09-05 – 2022-09-07 (×3): 40 mg via INTRAVENOUS
  Filled 2022-09-05 (×3): qty 1

## 2022-09-05 MED ORDER — CYCLOBENZAPRINE HCL 10 MG PO TABS: 5.0000 mg | ORAL_TABLET | Freq: Three times a day (TID) | ORAL | Status: DC | PRN

## 2022-09-05 MED ORDER — SODIUM CHLORIDE 0.9 % IV SOLN
500.0000 mg | INTRAVENOUS | Status: DC
  Administered 2022-09-05 – 2022-09-08 (×4): 500 mg via INTRAVENOUS
  Filled 2022-09-05 (×4): qty 5

## 2022-09-05 MED ORDER — EZETIMIBE 10 MG PO TABS
10.0000 mg | ORAL_TABLET | Freq: Every day | ORAL | Status: DC
  Administered 2022-09-05 – 2022-09-08 (×4): 10 mg via ORAL
  Filled 2022-09-05 (×4): qty 1

## 2022-09-05 MED ORDER — LEVOTHYROXINE SODIUM 50 MCG PO TABS
50.0000 ug | ORAL_TABLET | Freq: Every day | ORAL | Status: DC
  Administered 2022-09-05 – 2022-09-08 (×4): 50 ug via ORAL
  Filled 2022-09-05 (×4): qty 1

## 2022-09-05 MED ORDER — BUDESONIDE 0.25 MG/2ML IN SUSP
0.2500 mg | Freq: Two times a day (BID) | RESPIRATORY_TRACT | Status: DC
  Administered 2022-09-05 – 2022-09-08 (×7): 0.25 mg via RESPIRATORY_TRACT
  Filled 2022-09-05 (×7): qty 2

## 2022-09-05 MED ORDER — HYDROCODONE BIT-HOMATROP MBR 5-1.5 MG/5ML PO SOLN: 5.0000 mL | Freq: Three times a day (TID) | ORAL | Status: DC | PRN

## 2022-09-05 MED ORDER — SODIUM CHLORIDE 0.9 % IV SOLN
2.0000 g | INTRAVENOUS | Status: DC
  Administered 2022-09-05 – 2022-09-08 (×4): 2 g via INTRAVENOUS
  Filled 2022-09-05 (×4): qty 20

## 2022-09-05 MED ORDER — ALBUTEROL SULFATE (2.5 MG/3ML) 0.083% IN NEBU
2.5000 mg | INHALATION_SOLUTION | Freq: Four times a day (QID) | RESPIRATORY_TRACT | Status: DC | PRN
  Administered 2022-09-05: 2.5 mg via RESPIRATORY_TRACT
  Filled 2022-09-05: qty 3

## 2022-09-05 MED ORDER — ASPIRIN 81 MG PO TBEC
81.0000 mg | DELAYED_RELEASE_TABLET | Freq: Every day | ORAL | Status: DC
  Administered 2022-09-05 – 2022-09-07 (×3): 81 mg via ORAL
  Filled 2022-09-05 (×4): qty 1

## 2022-09-05 MED ORDER — FLUTICASONE PROPIONATE 50 MCG/ACT NA SUSP
1.0000 | Freq: Every day | NASAL | Status: DC
  Administered 2022-09-07 – 2022-09-08 (×2): 1 via NASAL
  Filled 2022-09-05: qty 16

## 2022-09-05 MED ORDER — METOPROLOL SUCCINATE ER 25 MG PO TB24
25.0000 mg | ORAL_TABLET | Freq: Every day | ORAL | Status: DC
  Administered 2022-09-05 – 2022-09-08 (×4): 25 mg via ORAL
  Filled 2022-09-05 (×4): qty 1

## 2022-09-05 MED ORDER — IPRATROPIUM-ALBUTEROL 0.5-2.5 (3) MG/3ML IN SOLN
3.0000 mL | Freq: Four times a day (QID) | RESPIRATORY_TRACT | Status: DC
  Administered 2022-09-05 (×2): 3 mL via RESPIRATORY_TRACT
  Filled 2022-09-05 (×2): qty 3

## 2022-09-05 MED ORDER — PANTOPRAZOLE SODIUM 40 MG PO TBEC
40.0000 mg | DELAYED_RELEASE_TABLET | Freq: Every day | ORAL | Status: DC
  Administered 2022-09-05 – 2022-09-08 (×4): 40 mg via ORAL
  Filled 2022-09-05 (×4): qty 1

## 2022-09-05 MED ORDER — GUAIFENESIN 100 MG/5ML PO LIQD
5.0000 mL | ORAL | Status: DC | PRN
  Administered 2022-09-05 – 2022-09-06 (×3): 5 mL via ORAL
  Filled 2022-09-05 (×3): qty 10

## 2022-09-05 MED ORDER — DEXTROSE IN LACTATED RINGERS 5 % IV SOLN: INTRAVENOUS | Status: DC

## 2022-09-05 MED ORDER — ENOXAPARIN SODIUM 40 MG/0.4ML IJ SOSY
40.0000 mg | PREFILLED_SYRINGE | INTRAMUSCULAR | Status: DC
  Administered 2022-09-06: 40 mg via SUBCUTANEOUS
  Filled 2022-09-05 (×2): qty 0.4

## 2022-09-05 MED ORDER — ROSUVASTATIN CALCIUM 20 MG PO TABS
40.0000 mg | ORAL_TABLET | Freq: Every day | ORAL | Status: DC
  Filled 2022-09-05: qty 2

## 2022-09-05 MED ORDER — IPRATROPIUM-ALBUTEROL 0.5-2.5 (3) MG/3ML IN SOLN
3.0000 mL | Freq: Three times a day (TID) | RESPIRATORY_TRACT | Status: DC
  Administered 2022-09-06 – 2022-09-08 (×7): 3 mL via RESPIRATORY_TRACT
  Filled 2022-09-05 (×7): qty 3

## 2022-09-05 MED ORDER — TICAGRELOR 90 MG PO TABS
90.0000 mg | ORAL_TABLET | Freq: Two times a day (BID) | ORAL | Status: DC
  Administered 2022-09-05 – 2022-09-08 (×7): 90 mg via ORAL
  Filled 2022-09-05 (×7): qty 1

## 2022-09-05 MED ORDER — GUAIFENESIN ER 600 MG PO TB12
600.0000 mg | ORAL_TABLET | Freq: Two times a day (BID) | ORAL | Status: DC
  Administered 2022-09-05 – 2022-09-08 (×7): 600 mg via ORAL
  Filled 2022-09-05 (×7): qty 1

## 2022-09-05 NOTE — Assessment & Plan Note (Signed)
Continue Crestor 

## 2022-09-05 NOTE — Assessment & Plan Note (Addendum)
Pulse ox 88% on room air initially.  Patient on high flow nasal cannula 6 L yesterday morning, currently tapered down to regular nasal cannula 4 L.  Added Solu-Medrol for bronchospasm on 09/05/2022.  Currently off oxygen

## 2022-09-05 NOTE — Progress Notes (Signed)
Pt arrived to room 219. Call bell in reach. Bed in lowest postion

## 2022-09-05 NOTE — Assessment & Plan Note (Addendum)
Status post stent 04/2022 Continue DAPT with aspirin and Brilinta , metoprolol and rosuvastatin

## 2022-09-05 NOTE — H&P (Signed)
History and Physical    Patient: [name] EXB:[id number] DOB: 12/18/1945 DOA: 09/04/2022 DOS: the patient was seen and examined on 09/05/2022 PCP: Binnie Rail, MD  Patient coming from: Home  Chief Complaint:  Chief Complaint  Patient presents with   Cough    HPI: [name] is a 76 y.o. female with medical history significant for CAD s/p PCI 8/23 on DAPT, Type 2 DM, hypothyroidism, anxiety, depression, and venous insufficiency, diagnosed with RSV on 12/4 who presents to the ED with persistent productive cough, worsening shortness of breath and generalized malaise and weakness.  She saw her PCP on 12/8 and was started on albuterol and prednisone but her symptoms have persisted.  She denies nausea, vomiting, abdominal pain or diarrhea. ED course and data review: Tmax 98.9 with BP 153/73 pulse 88 and O2 sat 86% on room air requiring 2 L to maintain sats in the high 90s.  WBC 18,000, lactic acid pending, procalcitonin 0.63.  Troponin 58 and BNP 277.  Creatinine 3.11, up from 0.91 a week prior.  AST 68 and ALT 55. EKG, personally viewed and interpreted showing sinus rhythm at 85 with no ST-T wave changes.  Chest x-ray consistent with pneumonia as outlined below: IMPRESSION: 1. Streaky and patchy retrocardiac opacities with ill-defined opacity in the right infrahilar region. Findings may represent atelectasis or pneumonia. 2. Vascular congestion.  Patient started on Rocephin and azithromycin and given a fluid bolus and hospitalist consulted for admission.   Review of Systems: As mentioned in the history of present illness. All other systems reviewed and are negative.  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 ( Dr Tonia Brooms)   Subclinical hypothyroidism 05/17/2011   Venous insufficiency of right leg 06/23/2017   Follows with vascular   Weakness    Past Surgical History:  Procedure Laterality Date   CHOLECYSTECTOMY N/A 06/13/2017   Procedure: LAPAROSCOPIC CHOLECYSTECTOMY;  Surgeon: Vickie Epley, MD;  Location: [facility] ORS;  Service: General;  Laterality: N/A;   COLONOSCOPY     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/25/2022   Procedure: CORONARY STENT INTERVENTION;  Surgeon: Troy Sine, MD;  Location: [place] [facility];  Service: Cardiovascular;  Laterality: N/A;   LEFT HEART CATH AND CORONARY ANGIOGRAPHY N/A 04/25/2022   Procedure: LEFT HEART CATH AND CORONARY ANGIOGRAPHY;  Surgeon: Troy Sine, MD;  Location: [place] [facility];  Service: Cardiovascular;  Laterality: N/A;   MASS EXCISION     right upper inner thigh or cyst   WISDOM TOOTH EXTRACTION     Social History:  reports that she has never smoked. She has never used smokeless tobacco. She reports that she does not drink alcohol and does not use drugs.  Allergies  Allergen Reactions   Atorvastatin Other (See Comments)    REACTION: Upset stomach   Augmentin [Amoxicillin-Pot Clavulanate] Diarrhea    Family History  Problem Relation Age of Onset   COPD Mother        deceased 10-14-08   Alzheimer's disease Father        deceased 2009-08-14   Coronary artery disease Father  s/p cabg   Breast cancer Maternal Grandmother    Colon cancer Neg Hx    Colon polyps Neg Hx    Esophageal cancer Neg Hx    Rectal cancer Neg Hx    Stomach cancer Neg Hx     Prior to Admission medications   Medication Sig Start Date End Date Taking? Authorizing Provider  acetaminophen (TYLENOL) 325 MG tablet Take 2 tablets (650 mg total) by mouth every 6 (six) hours as needed. 08/29/22   Jeanell Sparrow, DO  albuterol (VENTOLIN HFA) 108 (90 Base) MCG/ACT inhaler Inhale 2 puffs into the  lungs every 6 (six) hours as needed for wheezing or shortness of breath. 09/02/22   Binnie Rail, MD  aspirin 81 MG tablet Take 81 mg by mouth daily.    [provider]  cyclobenzaprine (FLEXERIL) 5 MG tablet Take 1-2 tablets (5-10 mg total) by mouth 3 (three) times daily as needed for muscle spasms. 06/30/22   Binnie Rail, MD  ezetimibe (ZETIA) 10 MG tablet Take 1 tablet (10 mg total) by mouth daily. 05/06/22 05/01/23  Troy Sine, MD  fluticasone (FLONASE) 50 MCG/ACT nasal spray Place 1 spray into both nostrils daily for 7 days. 08/29/22 09/05/22  Jeanell Sparrow, DO  guaiFENesin (ROBITUSSIN) 100 MG/5ML liquid Take 5 mLs by mouth every 4 (four) hours as needed for cough or to loosen phlegm. 08/29/22   Jeanell Sparrow, DO  HYDROcodone bit-homatropine (HYCODAN) 5-1.5 MG/5ML syrup Take 5 mLs by mouth every 8 (eight) hours as needed for cough. 09/02/22   Binnie Rail, MD  ibuprofen (ADVIL) 600 MG tablet Take 1 tablet (600 mg total) by mouth every 6 (six) hours as needed. 08/29/22   Jeanell Sparrow, DO  levothyroxine (SYNTHROID) 50 MCG tablet TAKE 1 TABLET BY MOUTH EVERY DAY 11/08/21   Binnie Rail, MD  metoprolol succinate (TOPROL XL) 25 MG 24 hr tablet Take 1 tablet (25 mg total) by mouth daily. 02/17/22   Lelon Perla, MD  omeprazole (PRILOSEC) 20 MG capsule Take 1 capsule (20 mg total) by mouth daily as needed. Patient taking differently: Take 20 mg by mouth daily. 02/02/22   Binnie Rail, MD  predniSONE (DELTASONE) 20 MG tablet Take 2 tablets (40 mg total) by mouth daily with breakfast for 5 days. 09/02/22 09/07/22  Binnie Rail, MD  rosuvastatin (CRESTOR) 40 MG tablet Take 1 tablet (40 mg total) by mouth daily. 05/03/22   Almyra Deforest, PA  ticagrelor (BRILINTA) 90 MG TABS tablet Take 1 tablet (90 mg total) by mouth 2 (two) times daily. 05/03/22   Almyra Deforest, PA    Physical Exam: Vitals:   09/04/22 2140 09/04/22 2144 09/04/22 2149 09/04/22 2153  BP: (!) 153/73     Pulse: 88     Resp:  19     Temp: 98.9 F (37.2 C)     TempSrc: Oral     SpO2: (!) 86% (!) 89% 93% 98%  Weight:  113.4 kg     Physical Exam Vitals and nursing note reviewed.  Constitutional:      Appearance: She is ill-appearing.     Comments: Conversational dyspnea  HENT:     Head: Normocephalic and atraumatic.  Cardiovascular:     Rate and Rhythm: Normal rate and regular rhythm.     Heart sounds: Normal heart sounds.  Pulmonary:     Effort: Tachypnea present.     Breath sounds: Wheezing present.  Abdominal:  Palpations: Abdomen is soft.     Tenderness: There is no abdominal tenderness.  Neurological:     Mental Status: Mental status is at baseline.     Labs on Admission: I have personally reviewed following labs and imaging studies  CBC: Recent Labs  Lab 08/29/22 1032 09/04/22 2154  WBC 9.2 18.1*  NEUTROABS 7.5 15.5*  HGB 13.4 11.8*  HCT 41.0 36.3  MCV 90.3 88.3  PLT 135* 712   Basic Metabolic Panel: Recent Labs  Lab 08/29/22 1032 09/04/22 2154  NA 135 133*  K 4.2 3.6  CL 99 102  CO2 25 21*  GLUCOSE 122* 184*  BUN 13 37*  CREATININE 0.91 3.11*  CALCIUM 9.1 8.7*   GFR: Estimated Creatinine Clearance: 21 mL/min (A) (by C-G formula based on SCr of 3.11 mg/dL (H)). Liver Function Tests: Recent Labs  Lab 09/04/22 2154  AST 68*  ALT 55*  ALKPHOS 83  BILITOT 0.6  PROT 6.8  ALBUMIN 2.6*   No results for input(s): "LIPASE", "AMYLASE" in the last 168 hours. No results for input(s): "AMMONIA" in the last 168 hours. Coagulation Profile: No results for input(s): "INR", "PROTIME" in the last 168 hours. Cardiac Enzymes: No results for input(s): "CKTOTAL", "CKMB", "CKMBINDEX", "TROPONINI" in the last 168 hours. BNP (last 3 results) No results for input(s): "PROBNP" in the last 8760 hours. HbA1C: No results for input(s): "HGBA1C" in the last 72 hours. CBG: No results for input(s): "GLUCAP" in the last 168 hours. Lipid Profile: No results for input(s): "CHOL", "HDL",  "LDLCALC", "TRIG", "CHOLHDL", "LDLDIRECT" in the last 72 hours. Thyroid Function Tests: No results for input(s): "TSH", "T4TOTAL", "FREET4", "T3FREE", "THYROIDAB" in the last 72 hours. Anemia Panel: No results for input(s): "VITAMINB12", "FOLATE", "FERRITIN", "TIBC", "IRON", "RETICCTPCT" in the last 72 hours. Urine analysis:    Component Value Date/Time   COLORURINE YELLOW 07/19/2022 1103   APPEARANCEUR CLEAR 07/19/2022 1103   LABSPEC 1.020 07/19/2022 1103   PHURINE 6.5 07/19/2022 1103   GLUCOSEU NEGATIVE 07/19/2022 1103   HGBUR TRACE-INTACT (A) 07/19/2022 1103   BILIRUBINUR NEGATIVE 07/19/2022 1103   BILIRUBINUR negative 06/30/2022 1159   KETONESUR NEGATIVE 07/19/2022 1103   PROTEINUR Positive (A) 06/30/2022 1159   PROTEINUR NEGATIVE 01/15/2008 0603   UROBILINOGEN 0.2 07/19/2022 1103   NITRITE NEGATIVE 07/19/2022 1103   LEUKOCYTESUR NEGATIVE 07/19/2022 1103    Radiological Exams on Admission: DG Chest 2 View  Result Date: 09/04/2022 CLINICAL DATA:  Shortness of breath. Cough. Recent diagnosis of RSV. EXAM: CHEST - 2 VIEW COMPARISON:  Radiographs 6 days ago 08/29/2022 FINDINGS: Heart is upper normal in size. There is vascular congestion. Streaky and patchy retrocardiac opacities with ill-defined opacity in the right infrahilar region. No pneumothorax or pleural effusion. No acute osseous findings IMPRESSION: 1. Streaky and patchy retrocardiac opacities with ill-defined opacity in the right infrahilar region. Findings may represent atelectasis or pneumonia. 2. Vascular congestion. Electronically Signed   By: Keith Rake M.D.   On: 09/04/2022 22:07     Data Reviewed: Relevant notes from primary care and specialist visits, past discharge summaries as available in EHR, including Care Everywhere. Prior diagnostic testing as pertinent to current admission diagnoses Updated medications and problem lists for reconciliation ED course, including vitals, labs, imaging, treatment and  response to treatment Triage notes, nursing and pharmacy notes and ED provider's notes Notable results as noted in HPI   Assessment and Plan: * Pneumonia due to respiratory syncytial virus (RSV) Suspect secondary bacterial infection Acute respiratory failure with hypoxia  Possible sepsis Patient with persistent and worsening respiratory symptoms from RSV diagnosis on 12/4 with O2 sat 88% on room air WBC 18,000 with procalcitonin 0.6.  Chest x-ray showing opacities that may represent pneumonia Possible sepsis criteria includes AKI, respiratory failure, leukocytosis.  Patient reports being febrile at home Continue azithromycin and Rocephin Albuterol as needed Antitussives, incentive spirometry and supportive care Follow COVID, flu, blood culture, pneumonia antigens, lactic acid  AKI (acute kidney injury) ([place]) Creatinine 3.11 up from baseline of 0.91 Likely prerenal and related to poor oral intake in the setting of acute respiratory tract illness Possibility of sepsis, meeting borderline sepsis criteria pending lactic acid and IV hydration and monitor renal function  Transaminitis Possibly related to dehydration versus possible sepsis  CAD S/P percutaneous coronary angioplasty Status post stent 04/2022 Continue DAPT with aspirin and Brilinta , metoprolol and rosuvastatin  Hypothyroidism (acquired) Continue levothyroxine        DVT prophylaxis: Lovenox  Consults: none  Advance Care Planning:   Code Status: Prior   Family Communication: none  Disposition Plan: Back to previous home environment  Severity of Illness: The appropriate patient status for this patient is INPATIENT. Inpatient status is judged to be reasonable and necessary in order to provide the required intensity of service to ensure the patient's safety. The patient's presenting symptoms, physical exam findings, and initial radiographic and laboratory data in the context of their chronic comorbidities is felt  to place them at high risk for further clinical deterioration. Furthermore, it is not anticipated that the patient will be medically stable for discharge from the hospital within 2 midnights of admission.   * I certify that at the point of admission it is my clinical judgment that the patient will require inpatient hospital care spanning beyond 2 midnights from the point of admission due to high intensity of service, high risk for further deterioration and high frequency of surveillance required.*  Author: Athena Masse, MD 09/05/2022 12:56 AM  For on call review [email or web address].

## 2022-09-05 NOTE — Assessment & Plan Note (Addendum)
Secondary to infection

## 2022-09-05 NOTE — Progress Notes (Signed)
  Progress Note   Patient: [name] BLT:[id number] DOB: 27-Nov-1945 DOA: 09/04/2022     0 DOS: the patient was seen and examined on 09/05/2022     Assessment and Plan: * Pneumonia due to respiratory syncytial virus (RSV) Suspect secondary bacterial infection Acute respiratory failure with hypoxia Clinical sepsis, present on admission Patient with persistent and worsening respiratory symptoms from RSV diagnosis on 12/4 with O2 sat 88% on room air WBC 18,000, tachypnea with procalcitonin 0.6.  Chest x-ray showing opacities that may represent pneumonia Acute kidney injury, acute hypoxic respiratory failure Continue azithromycin and Rocephin Albuterol as needed   Acute respiratory failure with hypoxia ([facility]) Patient on high flow nasal cannula 6 L this morning, currently tapered down to regular nasal cannula 4 L.  Add Solu-Medrol for bronchospasm.  AKI (acute kidney injury) ([place]) Creatinine 3.11 up from baseline of 0.91 Likely prerenal and related to poor oral intake in the setting of acute respiratory tract illness   Transaminitis Secondary to infection  CAD S/P percutaneous coronary angioplasty Status post stent 04/2022 Continue DAPT with aspirin and Brilinta , metoprolol and rosuvastatin  Hypothyroidism (acquired) Continue levothyroxine  Hyperlipidemia Continue Crestor        Subjective: Patient feeling better than when she came in.  Was not eating very well.  No diarrhea.  No abdominal pain.  Some cough and shortness of breath.  Continuously coughing while is in the room.  Physical Exam: Vitals:   09/05/22 0738 09/05/22 0904 09/05/22 1030 09/05/22 1200  BP: (!) 169/66 (!) 155/73  139/81  Pulse: 75 79 94 70  Resp: 19 20 (!) 23 20  Temp: 97.8 F (36.6 C)     TempSrc: Oral     SpO2: 99% 96% 96% 95%  Weight:      Height:       Physical Exam HENT:     Head: Normocephalic.     Mouth/Throat:     Pharynx: No oropharyngeal exudate.  Eyes:      General: Lids are normal.     Conjunctiva/sclera: Conjunctivae normal.  Cardiovascular:     Rate and Rhythm: Normal rate and regular rhythm.     Heart sounds: Normal heart sounds, S1 normal and S2 normal.  Pulmonary:     Breath sounds: Examination of the right-lower field reveals decreased breath sounds and rhonchi. Examination of the left-lower field reveals decreased breath sounds and rhonchi. Decreased breath sounds and rhonchi present. No wheezing or rales.  Abdominal:     Palpations: Abdomen is soft.     Tenderness: There is no abdominal tenderness.  Musculoskeletal:     Right lower leg: No swelling.     Left lower leg: No swelling.  Skin:    General: Skin is warm.     Findings: No rash.  Neurological:     Mental Status: She is alert and oriented to person, place, and time.     Data Reviewed: Creatinine down to 3.0 AST 51, ALT 45 procalcitonin 0.36, white blood cell count 19.4 and hemoglobin 10.5  Family Communication: Declined  Disposition: Status is: Inpatient Remains inpatient appropriate because: Continue antibiotics.  Continue to try to taper off oxygen.  Continue IV fluids.  Planned Discharge Destination: Home    Time spent: 28 minutes  Author: Loletha Grayer, MD 09/05/2022 1:50 PM  For on call review [email or web address].

## 2022-09-05 NOTE — Assessment & Plan Note (Signed)
Continue levothyroxine 

## 2022-09-05 NOTE — Assessment & Plan Note (Addendum)
Suspect secondary bacterial infection Acute respiratory failure with hypoxia Clinical sepsis, present on admission Patient with persistent and worsening respiratory symptoms from RSV diagnosis on 12/4 with O2 sat 88% on room air WBC 18,000, tachypnea with procalcitonin 0.6.  Chest x-ray showing opacities that may represent pneumonia Acute kidney injury, acute hypoxic respiratory failure Continue azithromycin and Rocephin Albuterol as needed

## 2022-09-05 NOTE — Assessment & Plan Note (Addendum)
Creatinine 3.11 up from baseline of 0.91 Likely prerenal and related to poor oral intake in the setting of acute respiratory tract illness

## 2022-09-05 NOTE — Progress Notes (Signed)
Anticoagulation monitoring(Lovenox):  76 yo  female ordered Lovenox 30 mg Q24h    Filed Weights   09/04/22 2144  Weight: 113.4 kg (250 lb)   BMI 38   Lab Results  Component Value Date   CREATININE 3.11 (H) 09/04/2022   CREATININE 0.91 08/29/2022   CREATININE 1.08 (H) 07/26/2022   Estimated Creatinine Clearance: 21 mL/min (A) (by C-G formula based on SCr of 3.11 mg/dL (H)). Hemoglobin & Hematocrit     Component Value Date/Time   HGB 11.8 (L) 09/04/2022 2154   HGB 13.4 06/01/2022 0907   HCT 36.3 09/04/2022 2154   HCT 39.9 06/01/2022 0907     Per Protocol for Patient with estCrcl < 30 ml/min and BMI > 30, will transition to Lovenox 40 mg Q24h.

## 2022-09-06 DIAGNOSIS — A419 Sepsis, unspecified organism: Principal | ICD-10-CM

## 2022-09-06 DIAGNOSIS — R6520 Severe sepsis without septic shock: Secondary | ICD-10-CM

## 2022-09-06 LAB — BASIC METABOLIC PANEL WITH GFR
Anion gap: 8 (ref 5–15)
BUN: 39 mg/dL — ABNORMAL HIGH (ref 8–23)
CO2: 20 mmol/L — ABNORMAL LOW (ref 22–32)
Calcium: 8 mg/dL — ABNORMAL LOW (ref 8.9–10.3)
Chloride: 109 mmol/L (ref 98–111)
Creatinine, Ser: 2.75 mg/dL — ABNORMAL HIGH (ref 0.44–1.00)
GFR, Estimated: 18 mL/min — ABNORMAL LOW
Glucose, Bld: 149 mg/dL — ABNORMAL HIGH (ref 70–99)
Potassium: 3.2 mmol/L — ABNORMAL LOW (ref 3.5–5.1)
Sodium: 137 mmol/L (ref 135–145)

## 2022-09-06 LAB — CBC
HCT: 32.8 % — ABNORMAL LOW (ref 36.0–46.0)
Hemoglobin: 11 g/dL — ABNORMAL LOW (ref 12.0–15.0)
MCH: 28.8 pg (ref 26.0–34.0)
MCHC: 33.5 g/dL (ref 30.0–36.0)
MCV: 85.9 fL (ref 80.0–100.0)
Platelets: 196 K/uL (ref 150–400)
RBC: 3.82 MIL/uL — ABNORMAL LOW (ref 3.87–5.11)
RDW: 13.3 % (ref 11.5–15.5)
WBC: 22.3 K/uL — ABNORMAL HIGH (ref 4.0–10.5)
nRBC: 0 % (ref 0.0–0.2)

## 2022-09-06 LAB — LEGIONELLA PNEUMOPHILA SEROGP 1 UR AG: L. pneumophila Serogp 1 Ur Ag: NEGATIVE

## 2022-09-06 MED ORDER — ENOXAPARIN SODIUM 30 MG/0.3ML IJ SOSY
30.0000 mg | PREFILLED_SYRINGE | INTRAMUSCULAR | Status: DC
  Administered 2022-09-07 – 2022-09-08 (×2): 30 mg via SUBCUTANEOUS
  Filled 2022-09-06 (×2): qty 0.3

## 2022-09-06 MED ORDER — HYDROCOD POLI-CHLORPHE POLI ER 10-8 MG/5ML PO SUER
5.0000 mL | Freq: Every day | ORAL | Status: DC
  Administered 2022-09-06 – 2022-09-07 (×2): 5 mL via ORAL
  Filled 2022-09-06 (×2): qty 5

## 2022-09-06 MED ORDER — STERILE WATER FOR INJECTION IJ SOLN
INTRAMUSCULAR | Status: AC
  Administered 2022-09-06: 10 mL
  Filled 2022-09-06: qty 10

## 2022-09-06 MED ORDER — AMLODIPINE BESYLATE 5 MG PO TABS
5.0000 mg | ORAL_TABLET | Freq: Every day | ORAL | Status: DC
  Administered 2022-09-06 – 2022-09-07 (×2): 5 mg via ORAL
  Filled 2022-09-06 (×3): qty 1

## 2022-09-06 MED ORDER — POTASSIUM CHLORIDE CRYS ER 20 MEQ PO TBCR
40.0000 meq | EXTENDED_RELEASE_TABLET | Freq: Once | ORAL | Status: AC
  Administered 2022-09-06: 40 meq via ORAL
  Filled 2022-09-06: qty 2

## 2022-09-06 NOTE — Progress Notes (Signed)
PHARMACIST - PHYSICIAN COMMUNICATION  CONCERNING:  Enoxaparin (Lovenox) for DVT Prophylaxis    RECOMMENDATION: Patient was prescribed enoxaprin '40mg'$  q24 hours for VTE prophylaxis.   Filed Weights   09/04/22 2144 09/05/22 0229  Weight: 113.4 kg (250 lb) 113.4 kg (250 lb)    Body mass index is 38.01 kg/m.  Estimated Creatinine Clearance: 23.7 mL/min (A) (by C-G formula based on SCr of 2.75 mg/dL (H)).   Patient is candidate for enoxaparin '30mg'$  every 24 hours based on CrCl <76m/min or Weight <45kg  DESCRIPTION: [facility]  Patient is now receiving enoxaparin 30 mg every 24 hours    CGlean Salvo PharmD, BCPS Clinical Pharmacist  09/06/2022 10:34 AM

## 2022-09-06 NOTE — Progress Notes (Signed)
Progress Note   Patient: [name] RKY:[id number] DOB: [birth year] DOA: 09/04/2022     1 DOS: the patient was seen and examined on 09/06/2022   Brief hospital course: 76 year old female with history of CAD,  hypothyroidism, anxiety depression and venous insufficiency.  She is admitted with sepsis with RSV pneumonia and could not rule out bacterial pneumonia, acute kidney injury and acute respiratory failure.  Patient initially on high flow nasal cannula 6 L/min.  Creatinine 3.11 on presentation.  Steroids were added for bronchospasm.  12/12.  Currently off oxygen.  Creatinine down to 2.75.  Continue IV fluids.  Prescribed Tussionex at night.  Assessment and Plan: * Pneumonia due to respiratory syncytial virus (RSV) Suspect secondary bacterial infection Acute respiratory failure with hypoxia Clinical sepsis, present on admission Patient with persistent and worsening respiratory symptoms from RSV diagnosis on 12/4 with O2 sat 88% on room air WBC 18,000, tachypnea with procalcitonin 0.6.  Chest x-ray showing opacities that may represent pneumonia Acute kidney injury, acute hypoxic respiratory failure Continue azithromycin and Rocephin Albuterol as needed   Acute respiratory failure with hypoxemia (HCC) Pulse ox 88% on room air initially.  Patient on high flow nasal cannula 6 L yesterday morning, currently tapered down to regular nasal cannula 4 L.  Added Solu-Medrol for bronchospasm on 09/05/2022.  Currently off oxygen  AKI (acute kidney injury) ([place]) Creatinine 3.11 on presentation (baseline of 0.91).  Creatinine down to 2.75 today.  Continue IV fluids.   Transaminitis Secondary to infection  CAD S/P percutaneous coronary angioplasty Status post stent 04/2022 Continue DAPT with aspirin and Brilinta , metoprolol and rosuvastatin  Hypothyroidism (acquired) Continue levothyroxine  Hyperlipidemia Continue Crestor        Subjective: Patient feeling a little bit  better.  Stated she coughed all night asking for Tussionex at night.  Admitted with RSV infection and pneumonia.  Physical Exam: Vitals:   09/05/22 2016 09/05/22 2111 09/06/22 0522 09/06/22 0753  BP:  (!) 159/67 (!) 170/71 (!) 163/64  Pulse:  87 68 71  Resp:  '20 20 18  '$ Temp:  99 F (37.2 C) 98.3 F (36.8 C) 98 F (36.7 C)  TempSrc:  Oral Oral   SpO2: 98% 95% 95% 96%  Weight:      Height:       Physical Exam HENT:     Head: Normocephalic.     Mouth/Throat:     Pharynx: No oropharyngeal exudate.  Eyes:     General: Lids are normal.     Conjunctiva/sclera: Conjunctivae normal.  Cardiovascular:     Rate and Rhythm: Normal rate and regular rhythm.     Heart sounds: Normal heart sounds, S1 normal and S2 normal.  Pulmonary:     Breath sounds: Examination of the right-lower field reveals decreased breath sounds and rhonchi. Examination of the left-lower field reveals decreased breath sounds and rhonchi. Decreased breath sounds and rhonchi present. No wheezing or rales.  Abdominal:     Palpations: Abdomen is soft.     Tenderness: There is no abdominal tenderness.  Musculoskeletal:     Right lower leg: No swelling.     Left lower leg: No swelling.  Skin:    General: Skin is warm.     Findings: No rash.  Neurological:     Mental Status: She is alert and oriented to person, place, and time.     Data Reviewed: Potassium 3.2, creatinine 2.75, procalcitonin 0.36, hb 22.3, hb 11  Family Communication: declined  Disposition:  Status is: Inpatient Remains inpatient appropriate because: Still with acute kidney injury and treating sepsis with IV antibiotics.  Planned Discharge Destination: Home and Home with Home Health    Time spent: 28 minutes  Author: Loletha Grayer, MD 09/06/2022 1:43 PM  For on call review [email or web address].

## 2022-09-06 NOTE — Hospital Course (Signed)
76 year old female with history of CAD,  hypothyroidism, anxiety depression and venous insufficiency.  She is admitted with sepsis with RSV pneumonia and could not rule out bacterial pneumonia, acute kidney injury and acute respiratory failure.  Patient initially on high flow nasal cannula 6 L/min.  Creatinine 3.11 on presentation.  Steroids were added for bronchospasm.  12/12.  Currently off oxygen.  Creatinine down to 2.75.  Continue IV fluids.  Prescribed Tussionex at night.

## 2022-09-07 LAB — BASIC METABOLIC PANEL
Anion gap: 7 (ref 5–15)
BUN: 38 mg/dL — ABNORMAL HIGH (ref 8–23)
CO2: 23 mmol/L (ref 22–32)
Calcium: 8.1 mg/dL — ABNORMAL LOW (ref 8.9–10.3)
Chloride: 109 mmol/L (ref 98–111)
Creatinine, Ser: 2.63 mg/dL — ABNORMAL HIGH (ref 0.44–1.00)
GFR, Estimated: 19 mL/min — ABNORMAL LOW (ref >60)
Glucose, Bld: 197 mg/dL — ABNORMAL HIGH (ref 70–99)
Potassium: 4.4 mmol/L (ref 3.5–5.1)
Sodium: 139 mmol/L (ref 135–145)

## 2022-09-07 MED ORDER — STERILE WATER FOR INJECTION IJ SOLN
INTRAMUSCULAR | Status: AC
  Filled 2022-09-07: qty 10

## 2022-09-07 MED ORDER — HYDRALAZINE HCL 20 MG/ML IJ SOLN: 10.0000 mg | INTRAMUSCULAR | Status: DC | PRN

## 2022-09-07 NOTE — Progress Notes (Signed)
Mobility Specialist - Progress Note   09/07/22 1159  Mobility  Activity Ambulated independently in hallway  Level of Assistance Modified independent, requires aide device or extra time  Assistive Device None  Distance Ambulated (ft) 320 ft  Activity Response Tolerated well  $Mobility charge 1 Mobility    During mobility: HR, BP, SpO2 89-90% Post-mobility: HR, BP, SPO2 92%  Candie Mile Mobility Specialist 09/07/22 12:00 PM

## 2022-09-07 NOTE — TOC CM/SW Note (Signed)
  Transition of Care [facility]) Screening Note   Patient Details  Name: [name] Date of Birth: Jan 29, 1946   Transition [facility]) CM/SW Contact:    Candie Chroman, LCSW Phone Number: 09/07/2022, 9:51 AM    Transition of Care [facility]) has reviewed patient and no TOC needs have been identified at this time. We will continue to monitor patient advancement through interdisciplinary progression rounds. If new patient transition needs arise, please place a TOC consult.

## 2022-09-07 NOTE — Progress Notes (Signed)
PROGRESS NOTE    [name]  PPI:[id number] DOB: [birth year] DOA: 09/04/2022 PCP: Binnie Rail, MD    Brief Narrative:  76 year old female with history of CAD,  hypothyroidism, anxiety depression and venous insufficiency.  She is admitted with sepsis with RSV pneumonia and could not rule out bacterial pneumonia, acute kidney injury and acute respiratory failure.  Patient initially on high flow nasal cannula 6 L/min.  Creatinine 3.11 on presentation.  Steroids were added for bronchospasm.   12/12.  Currently off oxygen.  Creatinine down to 2.75.  Continue IV fluids.  Prescribed Tussionex at night. 12/13: Kidney function improving.  Creatinine 2.63   Assessment & Plan:   Principal Problem:   Pneumonia due to respiratory syncytial virus (RSV) Active Problems:   Acute respiratory failure with hypoxemia (HCC)   AKI (acute kidney injury) ([place])   Transaminitis   Hyperlipidemia   Hypothyroidism (acquired)   CAD S/P percutaneous coronary angioplasty   Sepsis with acute renal failure without septic shock ([place]) * Pneumonia due to respiratory syncytial virus (RSV) Suspect secondary bacterial infection Acute respiratory failure with hypoxia Clinical sepsis, present on admission Patient with persistent and worsening respiratory symptoms from RSV diagnosis on 12/4 with O2 sat 88% on room air WBC 18,000, tachypnea with procalcitonin 0.6.  Chest x-ray showing opacities that may represent pneumonia Plan: Continue azithromycin and Rocephin for empiric Treatment Bronchodilators Monitor vitals and fever curve     Acute respiratory failure with hypoxemia (HCC) Pulse ox 88% on room air initially.  Patient initially required high flow nasal cannula at 6 L.  Has subsequently been weaned to room air.  No wheezing noted on my evaluation.  Continue steroids and bronchodilators as above.   AKI (acute kidney injury) ([place]) Creatinine 3.11 on presentation (baseline of 0.91).  Creatinine  down to 2.63 today.  Continue IV fluids.     Transaminitis Secondary to infection   CAD S/P percutaneous coronary angioplasty Status post stent 04/2022 Continue DAPT with aspirin and Brilinta , metoprolol and rosuvastatin   Hypothyroidism (acquired) Continue levothyroxine   Hyperlipidemia Continue Crestor   DVT prophylaxis: SQ Lovenox Code Status: Full Family Communication: None Disposition Plan: Status is: Inpatient Remains inpatient appropriate because: AKI in setting of RSV infection with superimposed bacterial pneumonia.  Suspect discharge in 1 to 2 days.   Level of care: Med-Surg  Consultants:  None  Procedures:  None  Antimicrobials: Ceftriaxone Azithromycin   Subjective: Seen and examined.  Resting in bed.  Appears fatigued but otherwise stable.  Objective: Vitals:   09/06/22 2006 09/06/22 2111 09/07/22 0455 09/07/22 0913  BP:  (!) 151/84 134/72 (!) 172/71  Pulse:  89 70 86  Resp:  '16 18 18  '$ Temp:  98.5 F (36.9 C) 98.9 F (37.2 C) 98.8 F (37.1 C)  TempSrc:  Oral  Oral  SpO2: 94% 96% 93% 95%  Weight:      Height:       No intake or output data in the 24 hours ending 09/07/22 1311 Filed Weights   09/04/22 2144 09/05/22 0229  Weight: 113.4 kg 113.4 kg    Examination:  General exam: Appears calm and comfortable  Respiratory system: Scattered crackles wheeze.  Normal work of breathing.  Room air Cardiovascular system: S1-S2, RRR, no murmurs, no pedal edema Gastrointestinal system: Soft, NT/ND, normal bowel sounds Central nervous system: Alert and oriented. No focal neurological deficits. Extremities: Symmetric 5 x 5 power. Skin: No rashes, lesions or ulcers Psychiatry: Judgement and insight appear  normal. Mood & affect appropriate.     Data Reviewed: I have personally reviewed following labs and imaging studies  CBC: Recent Labs  Lab 09/04/22 2154 09/05/22 0417 09/06/22 0534  WBC 18.1* 19.4* 22.3*  NEUTROABS 15.5*  --   --   HGB  11.8* 10.5* 11.0*  HCT 36.3 32.5* 32.8*  MCV 88.3 89.3 85.9  PLT 206 166 629   Basic Metabolic Panel: Recent Labs  Lab 09/04/22 2154 09/05/22 0417 09/06/22 0534 09/07/22 0238  NA 133* 136 137 139  K 3.6 3.8 3.2* 4.4  CL 102 105 109 109  CO2 21* 21* 20* 23  GLUCOSE 184* 193* 149* 197*  BUN 37* 37* 39* 38*  CREATININE 3.11* 3.00* 2.75* 2.63*  CALCIUM 8.7* 7.9* 8.0* 8.1*   GFR: Estimated Creatinine Clearance: 24.8 mL/min (A) (by C-G formula based on SCr of 2.63 mg/dL (H)). Liver Function Tests: Recent Labs  Lab 09/04/22 2154 09/05/22 0417  AST 68* 51*  ALT 55* 45*  ALKPHOS 83 76  BILITOT 0.6 0.5  PROT 6.8 6.2*  ALBUMIN 2.6* 2.2*   No results for input(s): "LIPASE", "AMYLASE" in the last 168 hours. No results for input(s): "AMMONIA" in the last 168 hours. Coagulation Profile: No results for input(s): "INR", "PROTIME" in the last 168 hours. Cardiac Enzymes: No results for input(s): "CKTOTAL", "CKMB", "CKMBINDEX", "TROPONINI" in the last 168 hours. BNP (last 3 results) No results for input(s): "PROBNP" in the last 8760 hours. HbA1C: No results for input(s): "HGBA1C" in the last 72 hours. CBG: No results for input(s): "GLUCAP" in the last 168 hours. Lipid Profile: No results for input(s): "CHOL", "HDL", "LDLCALC", "TRIG", "CHOLHDL", "LDLDIRECT" in the last 72 hours. Thyroid Function Tests: No results for input(s): "TSH", "T4TOTAL", "FREET4", "T3FREE", "THYROIDAB" in the last 72 hours. Anemia Panel: No results for input(s): "VITAMINB12", "FOLATE", "FERRITIN", "TIBC", "IRON", "RETICCTPCT" in the last 72 hours. Sepsis Labs: Recent Labs  Lab 09/04/22 2154 09/05/22 0002 09/05/22 0417  PROCALCITON 0.63  --  0.36  LATICACIDVEN  --  1.4  --     Recent Results (from the past 240 hour(s))  Resp panel by RT-PCR (RSV, Flu A&B, Covid) Anterior Nasal Swab     Status: Abnormal   Collection Time: 08/29/22  7:25 AM   Specimen: Anterior Nasal Swab  Result Value Ref Range  Status   SARS Coronavirus 2 by RT PCR NEGATIVE NEGATIVE Final    Comment: (NOTE) SARS-CoV-2 target nucleic acids are NOT DETECTED.  The SARS-CoV-2 RNA is generally detectable in upper respiratory specimens during the acute phase of infection. The lowest concentration of SARS-CoV-2 viral copies this assay can detect is 138 copies/mL. A negative result does not preclude SARS-Cov-2 infection and should not be used as the sole basis for treatment or other patient management decisions. A negative result may occur with  improper specimen collection/handling, submission of specimen other than nasopharyngeal swab, presence of viral mutation(s) within the areas targeted by this assay, and inadequate number of viral copies(<138 copies/mL). A negative result must be combined with clinical observations, patient history, and epidemiological information. The expected result is Negative.  Fact Sheet for Patients:  EntrepreneurPulse.com.au  Fact Sheet for Healthcare Providers:  IncredibleEmployment.be  This test is no t yet approved or cleared by the [place] FDA and  has been authorized for detection and/or diagnosis of SARS-CoV-2 by FDA under an Emergency Use Authorization (EUA). This EUA will remain  in effect (meaning this test can be used) for the duration of the  COVID-19 declaration under Section 564(b)(1) of the Act, 21 U.S.C.section 360bbb-3(b)(1), unless the authorization is terminated  or revoked sooner.       Influenza A by PCR NEGATIVE NEGATIVE Final   Influenza B by PCR NEGATIVE NEGATIVE Final    Comment: (NOTE) The Xpert Xpress SARS-CoV-2/FLU/RSV plus assay is intended as an aid in the diagnosis of influenza from Nasopharyngeal swab specimens and should not be used as a sole basis for treatment. Nasal washings and aspirates are unacceptable for Xpert Xpress SARS-CoV-2/FLU/RSV testing.  Fact Sheet for  Patients: EntrepreneurPulse.com.au  Fact Sheet for Healthcare Providers: IncredibleEmployment.be  This test is not yet approved or cleared by the [place] FDA and has been authorized for detection and/or diagnosis of SARS-CoV-2 by FDA under an Emergency Use Authorization (EUA). This EUA will remain in effect (meaning this test can be used) for the duration of the COVID-19 declaration under Section 564(b)(1) of the Act, 21 U.S.C. section 360bbb-3(b)(1), unless the authorization is terminated or revoked.     Resp Syncytial Virus by PCR POSITIVE (A) NEGATIVE Final    Comment: (NOTE) Fact Sheet for Patients: EntrepreneurPulse.com.au  Fact Sheet for Healthcare Providers: IncredibleEmployment.be  This test is not yet approved or cleared by the [place] FDA and has been authorized for detection and/or diagnosis of SARS-CoV-2 by FDA under an Emergency Use Authorization (EUA). This EUA will remain in effect (meaning this test can be used) for the duration of the COVID-19 declaration under Section 564(b)(1) of the Act, 21 U.S.C. section 360bbb-3(b)(1), unless the authorization is terminated or revoked.  Performed at [facility], [address], [place], [place] [address]   Blood culture (routine x 2)     Status: None (Preliminary result)   Collection Time: 09/05/22 12:02 AM   Specimen: BLOOD  Result Value Ref Range Status   Specimen Description BLOOD LEFT ANTECUBITAL  Final   Special Requests Blood Culture adequate volume  Final   Culture   Final    NO GROWTH 2 DAYS Performed at [facility], [address], [place], [place] [address]    Report Status PENDING  Incomplete  Blood culture (routine x 2)     Status: None (Preliminary result)   Collection Time: 09/05/22 12:02 AM   Specimen: BLOOD  Result Value Ref Range Status   Specimen Description BLOOD BLOOD LEFT HAND   Final   Special Requests Blood Culture adequate volume  Final   Culture   Final    NO GROWTH 2 DAYS Performed at [facility], [address], [place], [place] [address]    Report Status PENDING  Incomplete  Resp panel by RT-PCR (RSV, Flu A&B, Covid) Anterior Nasal Swab     Status: Abnormal   Collection Time: 09/05/22 12:02 AM   Specimen: Anterior Nasal Swab  Result Value Ref Range Status   SARS Coronavirus 2 by RT PCR NEGATIVE NEGATIVE Final    Comment: (NOTE) SARS-CoV-2 target nucleic acids are NOT DETECTED.  The SARS-CoV-2 RNA is generally detectable in upper respiratory specimens during the acute phase of infection. The lowest concentration of SARS-CoV-2 viral copies this assay can detect is 138 copies/mL. A negative result does not preclude SARS-Cov-2 infection and should not be used as the sole basis for treatment or other patient management decisions. A negative result may occur with  improper specimen collection/handling, submission of specimen other than nasopharyngeal swab, presence of viral mutation(s) within the areas targeted by this assay, and inadequate number of viral copies(<138  copies/mL). A negative result must be combined with clinical observations, patient history, and epidemiological information. The expected result is Negative.  Fact Sheet for Patients:  EntrepreneurPulse.com.au  Fact Sheet for Healthcare Providers:  IncredibleEmployment.be  This test is no t yet approved or cleared by the [place] FDA and  has been authorized for detection and/or diagnosis of SARS-CoV-2 by FDA under an Emergency Use Authorization (EUA). This EUA will remain  in effect (meaning this test can be used) for the duration of the COVID-19 declaration under Section 564(b)(1) of the Act, 21 U.S.C.section 360bbb-3(b)(1), unless the authorization is terminated  or revoked sooner.       Influenza A by PCR NEGATIVE NEGATIVE Final    Influenza B by PCR NEGATIVE NEGATIVE Final    Comment: (NOTE) The Xpert Xpress SARS-CoV-2/FLU/RSV plus assay is intended as an aid in the diagnosis of influenza from Nasopharyngeal swab specimens and should not be used as a sole basis for treatment. Nasal washings and aspirates are unacceptable for Xpert Xpress SARS-CoV-2/FLU/RSV testing.  Fact Sheet for Patients: EntrepreneurPulse.com.au  Fact Sheet for Healthcare Providers: IncredibleEmployment.be  This test is not yet approved or cleared by the [place] FDA and has been authorized for detection and/or diagnosis of SARS-CoV-2 by FDA under an Emergency Use Authorization (EUA). This EUA will remain in effect (meaning this test can be used) for the duration of the COVID-19 declaration under Section 564(b)(1) of the Act, 21 U.S.C. section 360bbb-3(b)(1), unless the authorization is terminated or revoked.     Resp Syncytial Virus by PCR POSITIVE (A) NEGATIVE Final    Comment: (NOTE) Fact Sheet for Patients: EntrepreneurPulse.com.au  Fact Sheet for Healthcare Providers: IncredibleEmployment.be  This test is not yet approved or cleared by the [place] FDA and has been authorized for detection and/or diagnosis of SARS-CoV-2 by FDA under an Emergency Use Authorization (EUA). This EUA will remain in effect (meaning this test can be used) for the duration of the COVID-19 declaration under Section 564(b)(1) of the Act, 21 U.S.C. section 360bbb-3(b)(1), unless the authorization is terminated or revoked.  Performed at [facility], [address] [place], [place] [address]          Radiology Studies: No results found.      Scheduled Meds:  amLODipine  5 mg Oral Daily   aspirin EC  81 mg Oral Daily   budesonide (PULMICORT) nebulizer solution  0.25 mg Nebulization BID   chlorpheniramine-HYDROcodone  5 mL Oral QHS   enoxaparin  (LOVENOX) injection  30 mg Subcutaneous Q24H   ezetimibe  10 mg Oral Daily   fluticasone  1 spray Each Nare Daily   guaiFENesin  600 mg Oral BID   ipratropium-albuterol  3 mL Nebulization TID   levothyroxine  50 mcg Oral Q0600   methylPREDNISolone (SOLU-MEDROL) injection  40 mg Intravenous Daily   metoprolol succinate  25 mg Oral Daily   moxifloxacin  1 drop Right Eye TID   pantoprazole  40 mg Oral Daily   rosuvastatin  10 mg Oral Daily   ticagrelor  90 mg Oral BID   Continuous Infusions:  azithromycin 500 mg (09/07/22 0109)   cefTRIAXone (ROCEPHIN)  IV 2 g (09/07/22 0017)   lactated ringers 50 mL/hr at 09/07/22 1211     LOS: 2 days       Sidney Ace, MD [facility]   If 7PM-7AM, please contact night-coverage  09/07/2022, 1:11 PM

## 2022-09-07 NOTE — Progress Notes (Signed)
Mobility Specialist - Progress Note   09/07/22 1020  Mobility  Activity Ambulated independently in room  Level of Assistance Independent  Assistive Device None  Distance Ambulated (ft) 8 ft  Activity Response Tolerated well  $Mobility charge 1 Mobility   Pt sitting on commode upon entry, utilizing RA. Pt STS and amb indep. Pt ambulated indep to the chair, left sitting in recliner with needs within reach.   Candie Mile Mobility Specialist 09/07/22 10:23 AM

## 2022-09-07 NOTE — Progress Notes (Signed)
Mobility Specialist - Progress Note   09/07/22 1016  Mobility  Activity Transferred from bed to chair;Ambulated independently to bathroom  Level of Assistance Independent  Assistive Device None  Distance Ambulated (ft) 4 ft  Activity Response Tolerated well  Mobility Referral Yes  $Mobility charge 1 Mobility   Pt in fowler position upon entry, utilizing RA. Pt completed bed mob, STS and amb indep. Pt ambulated from the bed to the bathroom without AD, tolerated well. Pt left sitting on the commode, no complaints.   Candie Mile Mobility Specialist 09/07/22 10:19 AM

## 2022-09-07 NOTE — Progress Notes (Signed)
PT Cancellation Note  Patient Details Name: [name] MRN: [id number] DOB: [birth year]   Cancelled Treatment:    Reason Eval/Treat Not Completed: Other (comment). Consult received and chart reviewed. Pt documented as indep and level 6 mobility. Pt reports she has been mobilized with mobility specialist and feels at her baseline. No indication of acute PT needs. Will sign off. Recommend to continue with mobility specialist and re-consult if new needs arise.   Alexiah Koroma 09/07/2022, 10:28 AM Greggory Stallion, PT, DPT, [facility] [phone]

## 2022-09-08 LAB — MAGNESIUM: Magnesium: 2 mg/dL (ref 1.7–2.4)

## 2022-09-08 LAB — BASIC METABOLIC PANEL
Anion gap: 7 (ref 5–15)
BUN: 36 mg/dL — ABNORMAL HIGH (ref 8–23)
CO2: 21 mmol/L — ABNORMAL LOW (ref 22–32)
Calcium: 8 mg/dL — ABNORMAL LOW (ref 8.9–10.3)
Chloride: 109 mmol/L (ref 98–111)
Creatinine, Ser: 2.25 mg/dL — ABNORMAL HIGH (ref 0.44–1.00)
GFR, Estimated: 22 mL/min — ABNORMAL LOW (ref >60)
Glucose, Bld: 127 mg/dL — ABNORMAL HIGH (ref 70–99)
Potassium: 3.4 mmol/L — ABNORMAL LOW (ref 3.5–5.1)
Sodium: 137 mmol/L (ref 135–145)

## 2022-09-08 LAB — CBC WITH DIFFERENTIAL/PLATELET
Abs Immature Granulocytes: 0.28 10*3/uL — ABNORMAL HIGH (ref 0.00–0.07)
Basophils Absolute: 0 10*3/uL (ref 0.0–0.1)
Basophils Relative: 0 %
Eosinophils Absolute: 0 10*3/uL (ref 0.0–0.5)
Eosinophils Relative: 0 %
HCT: 33.9 % — ABNORMAL LOW (ref 36.0–46.0)
Hemoglobin: 11.3 g/dL — ABNORMAL LOW (ref 12.0–15.0)
Immature Granulocytes: 2 %
Lymphocytes Relative: 13 %
Lymphs Abs: 2.3 10*3/uL (ref 0.7–4.0)
MCH: 28.5 pg (ref 26.0–34.0)
MCHC: 33.3 g/dL (ref 30.0–36.0)
MCV: 85.6 fL (ref 80.0–100.0)
Monocytes Absolute: 1 10*3/uL (ref 0.1–1.0)
Monocytes Relative: 5 %
Neutro Abs: 14.6 10*3/uL — ABNORMAL HIGH (ref 1.7–7.7)
Neutrophils Relative %: 80 %
Platelets: 191 10*3/uL (ref 150–400)
RBC: 3.96 MIL/uL (ref 3.87–5.11)
RDW: 13.2 % (ref 11.5–15.5)
WBC: 18.2 10*3/uL — ABNORMAL HIGH (ref 4.0–10.5)
nRBC: 0 % (ref 0.0–0.2)

## 2022-09-08 MED ORDER — PREDNISONE 20 MG PO TABS
40.0000 mg | ORAL_TABLET | Freq: Every day | ORAL | Status: DC
  Administered 2022-09-08: 40 mg via ORAL
  Filled 2022-09-08: qty 2

## 2022-09-08 MED ORDER — PREDNISONE 20 MG PO TABS: 40.0000 mg | ORAL_TABLET | Freq: Every day | ORAL | 0 refills | Status: AC

## 2022-09-08 MED ORDER — CEFDINIR 300 MG PO CAPS: 300.0000 mg | ORAL_CAPSULE | Freq: Every day | ORAL | Status: DC

## 2022-09-08 MED ORDER — BENZONATATE 100 MG PO CAPS: 100.0000 mg | ORAL_CAPSULE | Freq: Three times a day (TID) | ORAL | 0 refills | Status: DC | PRN

## 2022-09-08 MED ORDER — CEFDINIR 300 MG PO CAPS: 300.0000 mg | ORAL_CAPSULE | Freq: Every day | ORAL | 0 refills | Status: AC

## 2022-09-08 NOTE — Plan of Care (Signed)
  Problem: Respiratory: Goal: Ability to maintain adequate ventilation will improve Outcome: Progressing   Problem: Education: Goal: Knowledge of General Education information will improve Description: Including pain rating scale, medication(s)/side effects and non-pharmacologic comfort measures Outcome: Progressing   Problem: Health Behavior/Discharge Planning: Goal: Ability to manage health-related needs will improve Outcome: Progressing   Problem: Activity: Goal: Risk for activity intolerance will decrease Outcome: Progressing   Problem: Nutrition: Goal: Adequate nutrition will be maintained Outcome: Progressing   Problem: Coping: Goal: Level of anxiety will decrease Outcome: Progressing   Problem: Safety: Goal: Ability to remain free from injury will improve Outcome: Progressing   Problem: Skin Integrity: Goal: Risk for impaired skin integrity will decrease Outcome: Progressing

## 2022-09-08 NOTE — Discharge Summary (Signed)
Physician Discharge Summary  Kensly Bowmer [name] BJY:[id number] DOB: [birth year] DOA: 09/04/2022  PCP: Binnie Rail, MD  Admit date: 09/04/2022 Discharge date: 09/08/2022  Admitted From: Home Disposition:  Home  Recommendations for Outpatient Follow-up:  Follow up with PCP in 1-2 weeks Repeat BMP in 1 week  Home Health:No  Equipment/Devices:None   Discharge Condition:Stable  CODE STATUS:FULL  Diet recommendation: Reg  Brief/Interim Summary: 76 year old female with history of CAD,  hypothyroidism, anxiety depression and venous insufficiency.  She is admitted with sepsis with RSV pneumonia and could not rule out bacterial pneumonia, acute kidney injury and acute respiratory failure.  Patient initially on high flow nasal cannula 6 L/min.  Creatinine 3.11 on presentation.  Steroids were added for bronchospasm.   12/12.  Currently off oxygen.  Creatinine down to 2.75.  Continue IV fluids.  Prescribed Tussionex at night. 12/13: Kidney function improving.  Creatinine 2.63 12/14: Kidney function continues to improve.  Not yet at baseline.  Patient on room air and otherwise doing well.  Breath sounds improved.  Cleared for discharge home at this time.  Complete empiric course of antibiotics for superimposed bacterial pneumonia.  Steroids for RSV and bronchodilation.  Patient has albuterol MDI at home and will resume this.  Given the kidney function is abnormal at time of discharge patient is instructed to see her primary care physician within 1 week of DC for repeat lab work including BMP.    Discharge Diagnoses:  Principal Problem:   Pneumonia due to respiratory syncytial virus (RSV) Active Problems:   Acute respiratory failure with hypoxemia (HCC)   AKI (acute kidney injury) ([place])   Transaminitis   Hyperlipidemia   Hypothyroidism (acquired)   CAD S/P percutaneous coronary angioplasty   Sepsis with acute renal failure without septic shock ([place])  * Pneumonia due to respiratory  syncytial virus (RSV) Suspect secondary bacterial infection Acute respiratory failure with hypoxia Clinical sepsis, present on admission Patient with persistent and worsening respiratory symptoms from RSV diagnosis on 12/4 with O2 sat 88% on room air.  Presented with leukocytosis, tachypnea, elevated procalcitonin.  Opacities on CXR.  Concern for superimposed bacterial pneumonia.  Patient was placed on azithromycin and Rocephin for empiric Treatment.  Started on bronchodilators and steroids.  Improved clinically.  At time of discharge patient has no leukocytosis.  She is on room air.  No adventitious breath sounds.  Stable for DC home at this time.  Will convert to cefdinir.  Complete additional 4 days for total 7-day antibiotic course.  Additional 4 days of prednisone prescribed.  Resume home bronchodilators.  As needed [employer]  Stable for DC home.  Ambulating with physical therapy without desaturation.  Acute respiratory failure with hypoxemia (HCC) Resolved   AKI (acute kidney injury) ([place]) Creatinine 3.11 on presentation (baseline of 0.91).  Creatinine down to 2.25 at time of discharge.  Patient was on IV fluids.  Suspect prerenal azotemia versus ATN in the setting of infection.  Patient encouraged to drink adequate fluids and will be instructed to see her primary care physician within 1 week of discharge for follow-up and repeat lab work.     CAD S/P percutaneous coronary angioplasty Status post stent 04/2022 Continue DAPT with aspirin and Brilinta , metoprolol and rosuvastatin   Hypothyroidism (acquired) Continue levothyroxine   Hyperlipidemia Continue Crestor  Discharge Instructions  Discharge Instructions     Diet - low sodium heart healthy   Complete by: As directed    Increase activity slowly   Complete by:  As directed       Allergies as of 09/08/2022       Reactions   Atorvastatin Other (See Comments)   REACTION: Upset stomach   Augmentin [amoxicillin-pot  Clavulanate] Diarrhea        Medication List     STOP taking these medications    cyclobenzaprine 5 MG tablet Commonly known as: FLEXERIL   fluticasone 50 MCG/ACT nasal spray Commonly known as: FLONASE       TAKE these medications    acetaminophen 325 MG tablet Commonly known as: Tylenol Take 2 tablets (650 mg total) by mouth every 6 (six) hours as needed.   albuterol 108 (90 Base) MCG/ACT inhaler Commonly known as: VENTOLIN HFA Inhale 2 puffs into the lungs every 6 (six) hours as needed for wheezing or shortness of breath.   aspirin 81 MG tablet Take 81 mg by mouth daily.   benzonatate 100 MG capsule Commonly known as: Tessalon Perles Take 1 capsule (100 mg total) by mouth 3 (three) times daily as needed for cough.   cefdinir 300 MG capsule Commonly known as: OMNICEF Take 1 capsule (300 mg total) by mouth daily for 3 days. Start taking on: September 09, 2022   ezetimibe 10 MG tablet Commonly known as: ZETIA Take 1 tablet (10 mg total) by mouth daily.   FT Tussin Adult 100 MG/5ML liquid Generic drug: guaiFENesin Take 5 mLs by mouth every 4 (four) hours as needed for cough or to loosen phlegm.   HYDROcodone bit-homatropine 5-1.5 MG/5ML syrup Commonly known as: HYCODAN Take 5 mLs by mouth every 8 (eight) hours as needed for cough.   ibuprofen 600 MG tablet Commonly known as: ADVIL Take 1 tablet (600 mg total) by mouth every 6 (six) hours as needed.   levothyroxine 50 MCG tablet Commonly known as: SYNTHROID TAKE 1 TABLET BY MOUTH EVERY DAY   metoprolol succinate 25 MG 24 hr tablet Commonly known as: Toprol XL Take 1 tablet (25 mg total) by mouth daily.   omeprazole 20 MG capsule Commonly known as: PRILOSEC Take 1 capsule (20 mg total) by mouth daily as needed. What changed: when to take this   predniSONE 20 MG tablet Commonly known as: DELTASONE Take 2 tablets (40 mg total) by mouth daily for 4 days. Start taking on: September 09, 2022 What  changed: when to take this   rosuvastatin 40 MG tablet Commonly known as: CRESTOR Take 1 tablet (40 mg total) by mouth daily.   ticagrelor 90 MG Tabs tablet Commonly known as: BRILINTA Take 1 tablet (90 mg total) by mouth 2 (two) times daily.        Follow-up Information     Binnie Rail, MD. Go on 09/14/2022.   Specialty: Internal Medicine Why: Appointment @ 2:20 pm. Contact information: [place] [address] [phone]                Allergies  Allergen Reactions   Atorvastatin Other (See Comments)    REACTION: Upset stomach   Augmentin [Amoxicillin-Pot Clavulanate] Diarrhea    Consultations: None   Procedures/Studies: DG Chest 2 View  Result Date: 09/04/2022 CLINICAL DATA:  Shortness of breath. Cough. Recent diagnosis of RSV. EXAM: CHEST - 2 VIEW COMPARISON:  Radiographs 6 days ago 08/29/2022 FINDINGS: Heart is upper normal in size. There is vascular congestion. Streaky and patchy retrocardiac opacities with ill-defined opacity in the right infrahilar region. No pneumothorax or pleural effusion. No acute osseous findings IMPRESSION: 1. Streaky and  patchy retrocardiac opacities with ill-defined opacity in the right infrahilar region. Findings may represent atelectasis or pneumonia. 2. Vascular congestion. Electronically Signed   By: Keith Rake M.D.   On: 09/04/2022 22:07   DG Chest Portable 1 View  Result Date: 08/29/2022 CLINICAL DATA:  Cough and congestion. EXAM: PORTABLE CHEST 1 VIEW COMPARISON:  06/12/2017 FINDINGS: The lungs are clear without focal pneumonia, edema, pneumothorax or pleural effusion. Trace atelectasis noted left base. The cardiopericardial silhouette is within normal limits for size. The visualized bony structures of the thorax are unremarkable. Telemetry leads overlie the chest. IMPRESSION: No active disease. Electronically Signed   By: Misty Stanley M.D.   On: 08/29/2022 08:01      Subjective: Seen and  examined on the day of discharge.  Breath sounds improved.  Patient afebrile.  Feels well overall.  Stable for discharge home.  Discharge Exam: Vitals:   09/08/22 0523 09/08/22 0740  BP: (!) 154/75 (!) 153/58  Pulse: 75 (!) 57  Resp:  18  Temp:  98.1 F (36.7 C)  SpO2:  96%   Vitals:   09/07/22 1953 09/08/22 0421 09/08/22 0523 09/08/22 0740  BP:  (!) 146/68 (!) 154/75 (!) 153/58  Pulse:  (!) 59 75 (!) 57  Resp:  16  18  Temp:  98 F (36.7 C)  98.1 F (36.7 C)  TempSrc:    Oral  SpO2: 95% 94%  96%  Weight:      Height:        General: Pt is alert, awake, not in acute distress Cardiovascular: RRR, S1/S2 +, no rubs, no gallops Respiratory: CTA bilaterally, no wheezing, no rhonchi Abdominal: Soft, NT, ND, bowel sounds + Extremities: no edema, no cyanosis    The results of significant diagnostics from this hospitalization (including imaging, microbiology, ancillary and laboratory) are listed below for reference.     Microbiology: Recent Results (from the past 240 hour(s))  Blood culture (routine x 2)     Status: None (Preliminary result)   Collection Time: 09/05/22 12:02 AM   Specimen: BLOOD  Result Value Ref Range Status   Specimen Description BLOOD LEFT ANTECUBITAL  Final   Special Requests Blood Culture adequate volume  Final   Culture   Final    NO GROWTH 3 DAYS Performed at [facility], [address] [place], [place] [address]    Report Status PENDING  Incomplete  Blood culture (routine x 2)     Status: None (Preliminary result)   Collection Time: 09/05/22 12:02 AM   Specimen: BLOOD  Result Value Ref Range Status   Specimen Description BLOOD BLOOD LEFT HAND  Final   Special Requests Blood Culture adequate volume  Final   Culture   Final    NO GROWTH 3 DAYS Performed at [facility], [address], [place], [place] [address]    Report Status PENDING  Incomplete  Resp panel by RT-PCR (RSV, Flu A&B, Covid) Anterior Nasal Swab      Status: Abnormal   Collection Time: 09/05/22 12:02 AM   Specimen: Anterior Nasal Swab  Result Value Ref Range Status   SARS Coronavirus 2 by RT PCR NEGATIVE NEGATIVE Final    Comment: (NOTE) SARS-CoV-2 target nucleic acids are NOT DETECTED.  The SARS-CoV-2 RNA is generally detectable in upper respiratory specimens during the acute phase of infection. The lowest concentration of SARS-CoV-2 viral copies this assay can detect is 138 copies/mL. A negative result does not preclude SARS-Cov-2 infection and should not  be used as the sole basis for treatment or other patient management decisions. A negative result may occur with  improper specimen collection/handling, submission of specimen other than nasopharyngeal swab, presence of viral mutation(s) within the areas targeted by this assay, and inadequate number of viral copies(<138 copies/mL). A negative result must be combined with clinical observations, patient history, and epidemiological information. The expected result is Negative.  Fact Sheet for Patients:  EntrepreneurPulse.com.au  Fact Sheet for Healthcare Providers:  IncredibleEmployment.be  This test is no t yet approved or cleared by the [place] FDA and  has been authorized for detection and/or diagnosis of SARS-CoV-2 by FDA under an Emergency Use Authorization (EUA). This EUA will remain  in effect (meaning this test can be used) for the duration of the COVID-19 declaration under Section 564(b)(1) of the Act, 21 U.S.C.section 360bbb-3(b)(1), unless the authorization is terminated  or revoked sooner.       Influenza A by PCR NEGATIVE NEGATIVE Final   Influenza B by PCR NEGATIVE NEGATIVE Final    Comment: (NOTE) The Xpert Xpress SARS-CoV-2/FLU/RSV plus assay is intended as an aid in the diagnosis of influenza from Nasopharyngeal swab specimens and should not be used as a sole basis for treatment. Nasal washings and aspirates are  unacceptable for Xpert Xpress SARS-CoV-2/FLU/RSV testing.  Fact Sheet for Patients: EntrepreneurPulse.com.au  Fact Sheet for Healthcare Providers: IncredibleEmployment.be  This test is not yet approved or cleared by the [place] FDA and has been authorized for detection and/or diagnosis of SARS-CoV-2 by FDA under an Emergency Use Authorization (EUA). This EUA will remain in effect (meaning this test can be used) for the duration of the COVID-19 declaration under Section 564(b)(1) of the Act, 21 U.S.C. section 360bbb-3(b)(1), unless the authorization is terminated or revoked.     Resp Syncytial Virus by PCR POSITIVE (A) NEGATIVE Final    Comment: (NOTE) Fact Sheet for Patients: EntrepreneurPulse.com.au  Fact Sheet for Healthcare Providers: IncredibleEmployment.be  This test is not yet approved or cleared by the [place] FDA and has been authorized for detection and/or diagnosis of SARS-CoV-2 by FDA under an Emergency Use Authorization (EUA). This EUA will remain in effect (meaning this test can be used) for the duration of the COVID-19 declaration under Section 564(b)(1) of the Act, 21 U.S.C. section 360bbb-3(b)(1), unless the authorization is terminated or revoked.  Performed at [facility], [address] [place], [place] [address]      Labs: BNP (last 3 results) Recent Labs    09/04/22 2154  BNP 341.9*   Basic Metabolic Panel: Recent Labs  Lab 09/04/22 2154 09/05/22 0417 09/06/22 0534 09/07/22 0238 09/08/22 0847  NA 133* 136 137 139 137  K 3.6 3.8 3.2* 4.4 3.4*  CL 102 105 109 109 109  CO2 21* 21* 20* 23 21*  GLUCOSE 184* 193* 149* 197* 127*  BUN 37* 37* 39* 38* 36*  CREATININE 3.11* 3.00* 2.75* 2.63* 2.25*  CALCIUM 8.7* 7.9* 8.0* 8.1* 8.0*  MG  --   --   --   --  2.0   Liver Function Tests: Recent Labs  Lab 09/04/22 2154 09/05/22 0417  AST 68* 51*  ALT  55* 45*  ALKPHOS 83 76  BILITOT 0.6 0.5  PROT 6.8 6.2*  ALBUMIN 2.6* 2.2*   No results for input(s): "LIPASE", "AMYLASE" in the last 168 hours. No results for input(s): "AMMONIA" in the last 168 hours. CBC: Recent Labs  Lab 09/04/22 2154 09/05/22 0417 09/06/22 0534 09/08/22 3790  WBC 18.1* 19.4* 22.3* 18.2*  NEUTROABS 15.5*  --   --  14.6*  HGB 11.8* 10.5* 11.0* 11.3*  HCT 36.3 32.5* 32.8* 33.9*  MCV 88.3 89.3 85.9 85.6  PLT 206 166 196 191   Cardiac Enzymes: No results for input(s): "CKTOTAL", "CKMB", "CKMBINDEX", "TROPONINI" in the last 168 hours. BNP: Invalid input(s): "POCBNP" CBG: No results for input(s): "GLUCAP" in the last 168 hours. D-Dimer No results for input(s): "DDIMER" in the last 72 hours. Hgb A1c No results for input(s): "HGBA1C" in the last 72 hours. Lipid Profile No results for input(s): "CHOL", "HDL", "LDLCALC", "TRIG", "CHOLHDL", "LDLDIRECT" in the last 72 hours. Thyroid function studies No results for input(s): "TSH", "T4TOTAL", "T3FREE", "THYROIDAB" in the last 72 hours.  Invalid input(s): "FREET3" Anemia work up No results for input(s): "VITAMINB12", "FOLATE", "FERRITIN", "TIBC", "IRON", "RETICCTPCT" in the last 72 hours. Urinalysis    Component Value Date/Time   COLORURINE YELLOW 07/19/2022 1103   APPEARANCEUR CLEAR 07/19/2022 1103   LABSPEC 1.020 07/19/2022 1103   PHURINE 6.5 07/19/2022 1103   GLUCOSEU NEGATIVE 07/19/2022 1103   HGBUR TRACE-INTACT (A) 07/19/2022 1103   BILIRUBINUR NEGATIVE 07/19/2022 1103   BILIRUBINUR negative 06/30/2022 1159   KETONESUR NEGATIVE 07/19/2022 1103   PROTEINUR Positive (A) 06/30/2022 1159   PROTEINUR NEGATIVE 01/15/2008 0603   UROBILINOGEN 0.2 07/19/2022 1103   NITRITE NEGATIVE 07/19/2022 1103   LEUKOCYTESUR NEGATIVE 07/19/2022 1103   Sepsis Labs Recent Labs  Lab 09/04/22 2154 09/05/22 0417 09/06/22 0534 09/08/22 0847  WBC 18.1* 19.4* 22.3* 18.2*   Microbiology Recent Results (from the past  240 hour(s))  Blood culture (routine x 2)     Status: None (Preliminary result)   Collection Time: 09/05/22 12:02 AM   Specimen: BLOOD  Result Value Ref Range Status   Specimen Description BLOOD LEFT ANTECUBITAL  Final   Special Requests Blood Culture adequate volume  Final   Culture   Final    NO GROWTH 3 DAYS Performed at [facility], [address], [place], [place] [address]    Report Status PENDING  Incomplete  Blood culture (routine x 2)     Status: None (Preliminary result)   Collection Time: 09/05/22 12:02 AM   Specimen: BLOOD  Result Value Ref Range Status   Specimen Description BLOOD BLOOD LEFT HAND  Final   Special Requests Blood Culture adequate volume  Final   Culture   Final    NO GROWTH 3 DAYS Performed at [facility], [address] [place], [place] [address]    Report Status PENDING  Incomplete  Resp panel by RT-PCR (RSV, Flu A&B, Covid) Anterior Nasal Swab     Status: Abnormal   Collection Time: 09/05/22 12:02 AM   Specimen: Anterior Nasal Swab  Result Value Ref Range Status   SARS Coronavirus 2 by RT PCR NEGATIVE NEGATIVE Final    Comment: (NOTE) SARS-CoV-2 target nucleic acids are NOT DETECTED.  The SARS-CoV-2 RNA is generally detectable in upper respiratory specimens during the acute phase of infection. The lowest concentration of SARS-CoV-2 viral copies this assay can detect is 138 copies/mL. A negative result does not preclude SARS-Cov-2 infection and should not be used as the sole basis for treatment or other patient management decisions. A negative result may occur with  improper specimen collection/handling, submission of specimen other than nasopharyngeal swab, presence of viral mutation(s) within the areas targeted by this assay, and inadequate number of viral copies(<138 copies/mL). A negative result must be combined with clinical  observations, patient history, and epidemiological information. The expected result is  Negative.  Fact Sheet for Patients:  EntrepreneurPulse.com.au  Fact Sheet for Healthcare Providers:  IncredibleEmployment.be  This test is no t yet approved or cleared by the [place] FDA and  has been authorized for detection and/or diagnosis of SARS-CoV-2 by FDA under an Emergency Use Authorization (EUA). This EUA will remain  in effect (meaning this test can be used) for the duration of the COVID-19 declaration under Section 564(b)(1) of the Act, 21 U.S.C.section 360bbb-3(b)(1), unless the authorization is terminated  or revoked sooner.       Influenza A by PCR NEGATIVE NEGATIVE Final   Influenza B by PCR NEGATIVE NEGATIVE Final    Comment: (NOTE) The Xpert Xpress SARS-CoV-2/FLU/RSV plus assay is intended as an aid in the diagnosis of influenza from Nasopharyngeal swab specimens and should not be used as a sole basis for treatment. Nasal washings and aspirates are unacceptable for Xpert Xpress SARS-CoV-2/FLU/RSV testing.  Fact Sheet for Patients: EntrepreneurPulse.com.au  Fact Sheet for Healthcare Providers: IncredibleEmployment.be  This test is not yet approved or cleared by the [place] FDA and has been authorized for detection and/or diagnosis of SARS-CoV-2 by FDA under an Emergency Use Authorization (EUA). This EUA will remain in effect (meaning this test can be used) for the duration of the COVID-19 declaration under Section 564(b)(1) of the Act, 21 U.S.C. section 360bbb-3(b)(1), unless the authorization is terminated or revoked.     Resp Syncytial Virus by PCR POSITIVE (A) NEGATIVE Final    Comment: (NOTE) Fact Sheet for Patients: EntrepreneurPulse.com.au  Fact Sheet for Healthcare Providers: IncredibleEmployment.be  This test is not yet approved or cleared by the [place] FDA and has been authorized for detection and/or diagnosis of  SARS-CoV-2 by FDA under an Emergency Use Authorization (EUA). This EUA will remain in effect (meaning this test can be used) for the duration of the COVID-19 declaration under Section 564(b)(1) of the Act, 21 U.S.C. section 360bbb-3(b)(1), unless the authorization is terminated or revoked.  Performed at [facility], [address], [place],  [address]      Time coordinating discharge: Over 30 minutes  SIGNED:   Sidney Ace, MD  [facility] 09/08/2022, 10:59 AM Pager   If 7PM-7AM, please contact night-coverage

## 2022-09-08 NOTE — Progress Notes (Signed)
Discharge instructions reviewed with the patient. Patient sent out via wheelchair to her waiting ride 

## 2022-09-09 ENCOUNTER — Telehealth: Payer: Self-pay

## 2022-09-09 NOTE — Patient Outreach (Signed)
  Care Coordination TOC Note Transition Care Management Unsuccessful Follow-up Telephone Call  Date of discharge and from where:  09/08/22-[facility]  Attempts:  1st Attempt  Reason for unsuccessful TCM follow-up call:  Unable to leave message    Enzo [place], RN,BSN,CCM [facility] Telephonic Care Management Coordinator Direct Phone: [phone] Toll Free: [phone] Fax: [phone]

## 2022-09-10 LAB — CULTURE, BLOOD (ROUTINE X 2)
Culture: NO GROWTH
Culture: NO GROWTH
Special Requests: ADEQUATE
Special Requests: ADEQUATE

## 2022-09-11 NOTE — Patient Instructions (Addendum)
     Blood work was ordered.   The lab is on the first floor.   Take sudafed.   Start taking the mucinex. Use the albuterol inhaler 2-3 times a day as needed     Medications changes include :   Breztri 2 puffs two times a day - RINSE your mouth out after each use.    Uses the devices from the hospital as much as possible.   Move around and try to take deep breaths   Let me know if you are not improving.

## 2022-09-11 NOTE — Progress Notes (Unsigned)
Subjective:    Patient ID: [name], female    DOB: 15-Oct-1945, 76 y.o.   MRN: [id number]     HPI [place] is here for follow up from the hospital.   Admitted 12/11-12/14 for RSV PNA  Dx with RSV 12/4 in the ED, saw me on 12/8 - I prescribed albuterol and prednisone but symptoms worsened - went to ED with persisted cough, worsening SOB, fatigue and weakness.  O2 sat in ED 86%, afebrile, WBC 18,000, procalcitonin 0.63, troponin 58, 277, Cr 3.11, AST, ALT mildly elevated.  CXR showed PNA.  Started on azithromycin, rocephin.  Given bronchodilatros, steroids.  Cr improved - not at baseline.  Discharged home on cefdinir, steroids, prednisone, albuterol   Feeling better.  Feeling weak, SOB, leg swelling.  Cough is still bad.  Has been very sedentary. Not using breathing devices they sent her home with.  Not sure what to do - feels horrible and wants to get better.   Medications and allergies reviewed with patient and updated if appropriate.  Current Outpatient Medications on File Prior to Visit  Medication Sig Dispense Refill   albuterol (VENTOLIN HFA) 108 (90 Base) MCG/ACT inhaler Inhale 2 puffs into the lungs every 6 (six) hours as needed for wheezing or shortness of breath. (Patient taking differently: Inhale 2 puffs into the lungs as needed for wheezing or shortness of breath.) 8 g 0   aspirin 81 MG tablet Take 81 mg by mouth daily.     ezetimibe (ZETIA) 10 MG tablet Take 1 tablet (10 mg total) by mouth daily. 90 tablet 3   guaiFENesin (ROBITUSSIN) 100 MG/5ML liquid Take 5 mLs by mouth every 4 (four) hours as needed for cough or to loosen phlegm. (Patient taking differently: Take 5 mLs by mouth as needed for cough or to loosen phlegm.) 120 mL 0   levothyroxine (SYNTHROID) 50 MCG tablet TAKE 1 TABLET BY MOUTH EVERY DAY 90 tablet 3   metoprolol succinate (TOPROL XL) 25 MG 24 hr tablet Take 1 tablet (25 mg total) by mouth daily. 90 tablet 3   omeprazole (PRILOSEC) 20 MG  capsule Take 1 capsule (20 mg total) by mouth daily as needed. (Patient taking differently: Take 20 mg by mouth daily.) 90 capsule 3   rosuvastatin (CRESTOR) 40 MG tablet Take 1 tablet (40 mg total) by mouth daily. 90 tablet 3   ticagrelor (BRILINTA) 90 MG TABS tablet Take 1 tablet (90 mg total) by mouth 2 (two) times daily. 60 tablet 11   acetaminophen (TYLENOL) 325 MG tablet Take 2 tablets (650 mg total) by mouth every 6 (six) hours as needed. (Patient not taking: Reported on 09/14/2022) 100 tablet 0   benzonatate (TESSALON PERLES) 100 MG capsule Take 1 capsule (100 mg total) by mouth 3 (three) times daily as needed for cough. (Patient not taking: Reported on 09/14/2022) 30 capsule 0   HYDROcodone bit-homatropine (HYCODAN) 5-1.5 MG/5ML syrup Take 5 mLs by mouth every 8 (eight) hours as needed for cough. (Patient not taking: Reported on 09/14/2022) 120 mL 0   No current facility-administered medications on file prior to visit.     Review of Systems  Constitutional:  Positive for fatigue. Negative for fever.       Eating pretty good  HENT:  Positive for rhinorrhea (clear). Negative for congestion and sore throat.   Respiratory:  Positive for cough (dry - sound wet - crackling in airway) and shortness of breath. Negative for wheezing.  Neurological:  Negative for light-headedness and headaches.       Objective:   Vitals:   09/14/22 1417  BP: 138/78  Pulse: 63  Temp: 98.6 F (37 C)  SpO2: 93%   BP Readings from Last 3 Encounters:  09/14/22 138/78  09/08/22 (!) 153/58  09/02/22 130/68   Wt Readings from Last 3 Encounters:  09/14/22 243 lb (110.2 kg)  09/05/22 250 lb (113.4 kg)  09/02/22 244 lb (110.7 kg)   Body mass index is 36.95 kg/m.    Physical Exam Constitutional:      General: She is not in acute distress.    Appearance: Normal appearance. She is not ill-appearing.  HENT:     Head: Normocephalic and atraumatic.  Cardiovascular:     Rate and Rhythm: Normal rate  and regular rhythm.  Pulmonary:     Effort: Pulmonary effort is normal. No respiratory distress.     Breath sounds: No wheezing or rales.     Comments: Cough sounds wet - lungs are clear Musculoskeletal:     Right lower leg: Edema (mild) present.     Left lower leg: Edema (mild) present.  Skin:    General: Skin is warm and dry.  Neurological:     Mental Status: She is alert.        Lab Results  Component Value Date   WBC 18.2 (H) 09/08/2022   HGB 11.3 (L) 09/08/2022   HCT 33.9 (L) 09/08/2022   PLT 191 09/08/2022   GLUCOSE 127 (H) 09/08/2022   CHOL 120 05/25/2022   TRIG 68 05/25/2022   HDL 56 05/25/2022   LDLDIRECT 152.1 06/26/2013   LDLCALC 50 05/25/2022   ALT 45 (H) 09/05/2022   AST 51 (H) 09/05/2022   NA 137 09/08/2022   K 3.4 (L) 09/08/2022   CL 109 09/08/2022   CREATININE 2.25 (H) 09/08/2022   BUN 36 (H) 09/08/2022   CO2 21 (L) 09/08/2022   TSH 2.99 01/31/2022   HGBA1C 5.8 01/31/2022   MICROALBUR 0.9 06/26/2013     Assessment & Plan:    See Problem List for Assessment and Plan of chronic medical problems.

## 2022-09-12 ENCOUNTER — Telehealth: Payer: Self-pay

## 2022-09-12 NOTE — Patient Outreach (Signed)
  Care Coordination TOC Note Transition Care Management Unsuccessful Follow-up Telephone Call  Date of discharge and from where:  09/08/22-[facility]  Attempts:  2nd Attempt  Reason for unsuccessful TCM follow-up call:  Unable to leave message    Enzo Montgomery, RN,BSN,CCM [place] [facility] Telephonic Care Management Coordinator Direct Phone: [phone] Toll Free: [phone] Fax: [phone]

## 2022-09-13 ENCOUNTER — Encounter: Payer: Self-pay | Admitting: Internal Medicine

## 2022-09-13 ENCOUNTER — Telehealth: Payer: Self-pay

## 2022-09-13 NOTE — Patient Outreach (Signed)
  Care Coordination TOC Note Transition Care Management Unsuccessful Follow-up Telephone Call  Date of discharge and from where:  09/08/22-[facility]  Attempts:  3rd Attempt  Reason for unsuccessful TCM follow-up call:  Unable to reach patient     Enzo Montgomery, RN,BSN,CCM [place] [facility] Telephonic Care Management Coordinator Direct Phone: [phone] Toll Free: [phone] Fax: [phone]

## 2022-09-14 ENCOUNTER — Ambulatory Visit (INDEPENDENT_AMBULATORY_CARE_PROVIDER_SITE_OTHER): Payer: PPO | Admitting: Internal Medicine

## 2022-09-14 VITALS — BP 138/78 | HR 63 | Temp 98.6°F | Ht 68.0 in | Wt 243.0 lb

## 2022-09-14 DIAGNOSIS — J9601 Acute respiratory failure with hypoxia: Secondary | ICD-10-CM | POA: Diagnosis not present

## 2022-09-14 DIAGNOSIS — A419 Sepsis, unspecified organism: Secondary | ICD-10-CM

## 2022-09-14 DIAGNOSIS — R6520 Severe sepsis without septic shock: Secondary | ICD-10-CM

## 2022-09-14 DIAGNOSIS — J121 Respiratory syncytial virus pneumonia: Secondary | ICD-10-CM | POA: Diagnosis not present

## 2022-09-14 DIAGNOSIS — N179 Acute kidney failure, unspecified: Secondary | ICD-10-CM

## 2022-09-14 DIAGNOSIS — R7989 Other specified abnormal findings of blood chemistry: Secondary | ICD-10-CM | POA: Diagnosis not present

## 2022-09-14 LAB — HEPATIC FUNCTION PANEL
ALT: 190 U/L — ABNORMAL HIGH (ref 0–35)
AST: 106 U/L — ABNORMAL HIGH (ref 0–37)
Albumin: 3.1 g/dL — ABNORMAL LOW (ref 3.5–5.2)
Alkaline Phosphatase: 79 U/L (ref 39–117)
Bilirubin, Direct: 0.2 mg/dL (ref 0.0–0.3)
Total Bilirubin: 0.5 mg/dL (ref 0.2–1.2)
Total Protein: 6.6 g/dL (ref 6.0–8.3)

## 2022-09-14 LAB — BASIC METABOLIC PANEL
BUN: 30 mg/dL — ABNORMAL HIGH (ref 6–23)
CO2: 30 mEq/L (ref 19–32)
Calcium: 8.9 mg/dL (ref 8.4–10.5)
Chloride: 99 mEq/L (ref 96–112)
Creatinine, Ser: 1.85 mg/dL — ABNORMAL HIGH (ref 0.40–1.20)
GFR: 26.44 mL/min — ABNORMAL LOW (ref >60.00)
Glucose, Bld: 121 mg/dL — ABNORMAL HIGH (ref 70–99)
Potassium: 4.3 mEq/L (ref 3.5–5.1)
Sodium: 137 mEq/L (ref 135–145)

## 2022-09-14 MED ORDER — BREZTRI AEROSPHERE 160-9-4.8 MCG/ACT IN AERO: 2.0000 | INHALATION_SPRAY | Freq: Two times a day (BID) | RESPIRATORY_TRACT | 11 refills | Status: DC

## 2022-09-14 NOTE — Assessment & Plan Note (Addendum)
Recent hospitalization for RSV with pneumonia and acute respiratory failure with hypoxia Pneumonia thought to be bacterial Clinical sepsis on admission Treated with antibiotics, steroids, bronchodilators and oxygen Discharged home on bronchodilators, steroids and a few days of antibiotics which she has completed Symptoms improved, but still experiencing a lot of symptoms Start Breztri 2 puffs bid ( sample given) - rinse mouth after, continue albuterol as needed Restart mucinex, sudafed Continue increased fluids Move around more - take deep breaths Use breathing devices given by the hospital regularly Update in a few days

## 2022-09-14 NOTE — Assessment & Plan Note (Signed)
Resolved RSV with probable bacterial pneumonia Doing well at home-still with residual symptoms

## 2022-09-14 NOTE — Assessment & Plan Note (Addendum)
Presented to the hospital with acute respiratory failure with hypoxemia-pulse ox was 88% on room air-improved with oxygen via nasal cannula which she was able to taper off of before discharge Was on Solu-Medrol for bronchospasm and discharged home on steroids Received antibiotics, bronchodilators Oxygen saturation better - 93%

## 2022-09-14 NOTE — Assessment & Plan Note (Addendum)
Acute kidney injury on CKD Creatinine did improve with IV fluids and treatment of illness On discharge creatinine was still not at baseline-will recheck today BMP, hepatic function

## 2022-09-14 NOTE — Addendum Note (Signed)
Addended by: Binnie Rail on: 09/14/2022 05:50 PM   Modules accepted: Orders

## 2022-09-16 ENCOUNTER — Encounter: Payer: Self-pay | Admitting: *Deleted

## 2022-09-16 ENCOUNTER — Telehealth: Payer: Self-pay | Admitting: Internal Medicine

## 2022-09-16 NOTE — Telephone Encounter (Signed)
Patient called to give an update on RSV. She would like a callback at [phone].

## 2022-09-28 ENCOUNTER — Other Ambulatory Visit (INDEPENDENT_AMBULATORY_CARE_PROVIDER_SITE_OTHER): Payer: PPO

## 2022-09-28 ENCOUNTER — Other Ambulatory Visit: Payer: Self-pay | Admitting: Internal Medicine

## 2022-09-28 DIAGNOSIS — R7989 Other specified abnormal findings of blood chemistry: Secondary | ICD-10-CM | POA: Diagnosis not present

## 2022-09-28 DIAGNOSIS — J121 Respiratory syncytial virus pneumonia: Secondary | ICD-10-CM

## 2022-09-28 DIAGNOSIS — Z780 Asymptomatic menopausal state: Secondary | ICD-10-CM | POA: Diagnosis not present

## 2022-09-28 DIAGNOSIS — Z1231 Encounter for screening mammogram for malignant neoplasm of breast: Secondary | ICD-10-CM | POA: Diagnosis not present

## 2022-09-28 LAB — HM MAMMOGRAPHY

## 2022-09-28 LAB — COMPREHENSIVE METABOLIC PANEL
ALT: 71 U/L — ABNORMAL HIGH (ref 0–35)
AST: 46 U/L — ABNORMAL HIGH (ref 0–37)
Albumin: 3.4 g/dL — ABNORMAL LOW (ref 3.5–5.2)
Alkaline Phosphatase: 84 U/L (ref 39–117)
BUN: 13 mg/dL (ref 6–23)
CO2: 28 mEq/L (ref 19–32)
Calcium: 9.1 mg/dL (ref 8.4–10.5)
Chloride: 105 mEq/L (ref 96–112)
Creatinine, Ser: 1.37 mg/dL — ABNORMAL HIGH (ref 0.40–1.20)
GFR: 37.91 mL/min — ABNORMAL LOW (ref >60.00)
Glucose, Bld: 137 mg/dL — ABNORMAL HIGH (ref 70–99)
Potassium: 4.1 mEq/L (ref 3.5–5.1)
Sodium: 141 mEq/L (ref 135–145)
Total Bilirubin: 0.5 mg/dL (ref 0.2–1.2)
Total Protein: 6.6 g/dL (ref 6.0–8.3)

## 2022-09-28 LAB — HM DEXA SCAN

## 2022-09-28 NOTE — Progress Notes (Signed)
HPI: FU CAD. Coronary CTA June 2023 showed calcium score 420 which is 85th percentile, severe stenosis in the mid right coronary artery and 50% left main.  FFR positive in the right coronary distribution.  Cardiac catheterization July 2023 showed 90% mid right coronary artery and normal LV function.  Patient had PCI of the right coronary artery with drug-eluting stent.  Recently discharged following admission for RSV pneumonia.  Since last seen she has some residual dyspnea on exertion following recent RSV infection.  However this is improving.  No orthopnea, PND, pedal edema, chest pain or syncope.  Current Outpatient Medications  Medication Sig Dispense Refill   aspirin 81 MG tablet Take 81 mg by mouth daily.     ezetimibe (ZETIA) 10 MG tablet Take 1 tablet (10 mg total) by mouth daily. 90 tablet 3   levothyroxine (SYNTHROID) 50 MCG tablet TAKE 1 TABLET BY MOUTH EVERY DAY 90 tablet 3   metoprolol succinate (TOPROL XL) 25 MG 24 hr tablet Take 1 tablet (25 mg total) by mouth daily. 90 tablet 3   omeprazole (PRILOSEC) 20 MG capsule Take 1 capsule (20 mg total) by mouth daily as needed. (Patient taking differently: Take 20 mg by mouth as needed.) 90 capsule 3   rosuvastatin (CRESTOR) 40 MG tablet Take 1 tablet (40 mg total) by mouth daily. 90 tablet 3   ticagrelor (BRILINTA) 90 MG TABS tablet Take 1 tablet (90 mg total) by mouth 2 (two) times daily. 60 tablet 11   acetaminophen (TYLENOL) 325 MG tablet Take 2 tablets (650 mg total) by mouth every 6 (six) hours as needed. (Patient not taking: Reported on 09/14/2022) 100 tablet 0   albuterol (VENTOLIN HFA) 108 (90 Base) MCG/ACT inhaler Inhale 2 puffs into the lungs every 6 (six) hours as needed for wheezing or shortness of breath. (Patient not taking: Reported on 10/12/2022) 8 g 0   Budeson-Glycopyrrol-Formoterol (BREZTRI AEROSPHERE) 160-9-4.8 MCG/ACT AERO Inhale 2 puffs into the lungs 2 (two) times daily. (Patient not taking: Reported on 10/12/2022)  10.7 g 11   guaiFENesin (ROBITUSSIN) 100 MG/5ML liquid Take 5 mLs by mouth every 4 (four) hours as needed for cough or to loosen phlegm. (Patient not taking: Reported on 10/12/2022) 120 mL 0   No current facility-administered medications for this visit.     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 ( Dr Tonia Brooms)   Subclinical hypothyroidism 05/17/2011   Venous insufficiency of right leg 06/23/2017   Follows with vascular   Weakness     Past Surgical History:  Procedure Laterality Date   CHOLECYSTECTOMY N/A 06/13/2017   Procedure: LAPAROSCOPIC CHOLECYSTECTOMY;  Surgeon: Vickie Epley, MD;  Location: [facility];  Service: General;  Laterality: N/A;   COLONOSCOPY     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/25/2022   Procedure: CORONARY STENT INTERVENTION;  Surgeon: Troy Sine, MD;  Location: [place] [facility];  Service: Cardiovascular;  Laterality: N/A;   LEFT HEART CATH AND CORONARY ANGIOGRAPHY N/A 04/25/2022   Procedure: LEFT HEART CATH AND CORONARY ANGIOGRAPHY;  Surgeon: Troy Sine, MD;  Location: [facility];  Service: Cardiovascular;  Laterality: N/A;   MASS EXCISION     right upper inner thigh or cyst   WISDOM TOOTH EXTRACTION      Social History   Socioeconomic History   Marital status: Widowed    Spouse name: Not on file   Number of children: 0   Years of education: 75   Highest education level: 12th grade  Occupational History   Not on file  Tobacco Use   Smoking status: Never   Smokeless tobacco: Never  Vaping Use   Vaping Use: Never used  Substance and Sexual Activity   Alcohol use: No   Drug use: No   Sexual activity: Not on file  Other  Topics Concern   Not on file  Social History Narrative   Retired Merchandiser, retail for [employer]   Widowed (husband - Eddie Dibbles), no children   Never Smoked    Alcohol use-no      Daily Caffeine Use 2/day   Social Determinants of Health   Financial Resource Strain: Low Risk  (04/12/2022)   Overall Financial Resource Strain (CARDIA)    Difficulty of Paying Living Expenses: Not hard at all  Food Insecurity: No Food Insecurity (09/05/2022)   Hunger Vital Sign    Worried About Running Out of Food in the Last Year: Never true    Ran Out of Food in the Last Year: Never true  Transportation Needs: No Transportation Needs (09/05/2022)   PRAPARE - Hydrologist (Medical): No    Lack of Transportation (Non-Medical): No  Physical Activity: Insufficiently Active (04/12/2022)   Exercise Vital Sign    Days of Exercise per Week: 3 days    Minutes of Exercise per Session: 30 min  Stress: No Stress Concern Present (04/12/2022)   [place]    Feeling of Stress : Not at all  Social Connections: Moderately Integrated (04/12/2022)   Social Connection and Isolation Panel [NHANES]    Frequency of Communication with Friends and Family: More than three times a week    Frequency of Social Gatherings with Friends and Family: More than three times a week    Attends Religious Services: More than 4 times per year    Active Member of [employer] or Organizations: Yes    Attends Archivist Meetings: More than 4 times per year    Marital Status: Widowed  Intimate Partner Violence: Not At Risk (09/05/2022)   Humiliation, Afraid, Rape, and Kick questionnaire    Fear of Current or Ex-Partner: No    Emotionally Abused: No    Physically Abused: No    Sexually Abused: No    Family History  Problem Relation Age of Onset   COPD Mother        deceased October 09, 2008   Alzheimer's disease Father        deceased  08-09-09   Coronary artery disease Father        s/p cabg   Breast cancer Maternal Grandmother    Colon cancer Neg Hx    Colon polyps Neg Hx    Esophageal cancer Neg Hx    Rectal cancer Neg Hx    Stomach cancer Neg Hx     ROS: no fevers or chills, productive cough, hemoptysis, dysphasia, odynophagia, melena, hematochezia, dysuria, hematuria, rash, seizure activity, orthopnea, PND, pedal edema, claudication. Remaining systems are negative.  Physical Exam: Well-developed well-nourished in no acute distress.  Skin is warm  and dry.  HEENT is normal.  Neck is supple.  Chest is clear to auscultation with normal expansion.  Cardiovascular exam is regular rate and rhythm.  Abdominal exam nontender or distended. No masses palpated. Extremities show no edema. neuro grossly intact   A/P  1 coronary artery disease-patient denies chest pain.  Continue aspirin, brilinta, Toprol and statin.  We will discontinue brilinta July 2024.  2 hyperlipidemia-continue Crestor at 40 mg daily and zetia.  Kirk Ruths, MD

## 2022-10-09 ENCOUNTER — Other Ambulatory Visit: Payer: Self-pay | Admitting: Pharmacist Clinician (PhC)/ Clinical Pharmacy Specialist

## 2022-10-09 DIAGNOSIS — E7800 Pure hypercholesterolemia, unspecified: Secondary | ICD-10-CM

## 2022-10-12 ENCOUNTER — Ambulatory Visit: Payer: PPO | Attending: Cardiology | Admitting: Cardiology

## 2022-10-12 ENCOUNTER — Encounter: Payer: Self-pay | Admitting: Cardiology

## 2022-10-12 VITALS — BP 142/78 | HR 72 | Wt 242.0 lb

## 2022-10-12 DIAGNOSIS — I2510 Atherosclerotic heart disease of native coronary artery without angina pectoris: Secondary | ICD-10-CM | POA: Diagnosis not present

## 2022-10-12 DIAGNOSIS — E7800 Pure hypercholesterolemia, unspecified: Secondary | ICD-10-CM | POA: Diagnosis not present

## 2022-10-12 LAB — HEPATIC FUNCTION PANEL
ALT: 59 IU/L — ABNORMAL HIGH (ref 0–32)
AST: 55 IU/L — ABNORMAL HIGH (ref 0–40)
Albumin: 3.6 g/dL — ABNORMAL LOW (ref 3.8–4.8)
Alkaline Phosphatase: 97 IU/L (ref 44–121)
Bilirubin Total: 0.5 mg/dL (ref 0.0–1.2)
Bilirubin, Direct: 0.19 mg/dL (ref 0.00–0.40)
Total Protein: 6.4 g/dL (ref 6.0–8.5)

## 2022-10-12 LAB — LIPID PANEL
Chol/HDL Ratio: 2.6 ratio (ref 0.0–4.4)
Cholesterol, Total: 124 mg/dL (ref 100–199)
HDL: 48 mg/dL (ref >39)
LDL Chol Calc (NIH): 57 mg/dL (ref 0–99)
Triglycerides: 105 mg/dL (ref 0–149)
VLDL Cholesterol Cal: 19 mg/dL (ref 5–40)

## 2022-10-12 NOTE — Patient Instructions (Signed)
    Follow-Up: At [facility], you and your health needs are our priority.  As part of our continuing mission to provide you with exceptional heart care, we have created designated Provider Care Teams.  These Care Teams include your primary Cardiologist (physician) and Advanced Practice Providers (APPs -  Physician Assistants and Nurse Practitioners) who all work together to provide you with the care you need, when you need it.  We recommend signing up for the patient portal called "MyChart".  Sign up information is provided on this After Visit Summary.  MyChart is used to connect with patients for Virtual Visits (Telemedicine).  Patients are able to view lab/test results, encounter notes, upcoming appointments, etc.  Non-urgent messages can be sent to your provider as well.   To learn more about what you can do with MyChart, go to [email or web address].    Your next appointment:   6 month(s)  Provider:   Diona Browner, NP    Then, Kirk Ruths, MD will plan to see you again in 12 month(s).

## 2022-10-13 ENCOUNTER — Other Ambulatory Visit: Payer: Self-pay | Admitting: *Deleted

## 2022-10-13 DIAGNOSIS — E7800 Pure hypercholesterolemia, unspecified: Secondary | ICD-10-CM

## 2022-10-20 ENCOUNTER — Encounter: Payer: Self-pay | Admitting: Internal Medicine

## 2022-10-20 NOTE — Progress Notes (Signed)
Outside notes received. Information abstracted. Notes sent to scan. 

## 2022-11-11 ENCOUNTER — Other Ambulatory Visit: Payer: Self-pay

## 2022-11-11 ENCOUNTER — Other Ambulatory Visit: Payer: Self-pay | Admitting: Internal Medicine

## 2022-11-12 ENCOUNTER — Other Ambulatory Visit: Payer: Self-pay | Admitting: Cardiology

## 2022-11-12 DIAGNOSIS — R072 Precordial pain: Secondary | ICD-10-CM

## 2022-11-30 ENCOUNTER — Encounter: Payer: Self-pay | Admitting: Internal Medicine

## 2022-12-06 DIAGNOSIS — I8311 Varicose veins of right lower extremity with inflammation: Secondary | ICD-10-CM | POA: Diagnosis not present

## 2022-12-06 DIAGNOSIS — L820 Inflamed seborrheic keratosis: Secondary | ICD-10-CM | POA: Diagnosis not present

## 2022-12-06 DIAGNOSIS — I872 Venous insufficiency (chronic) (peripheral): Secondary | ICD-10-CM | POA: Diagnosis not present

## 2022-12-06 DIAGNOSIS — D225 Melanocytic nevi of trunk: Secondary | ICD-10-CM | POA: Diagnosis not present

## 2022-12-06 DIAGNOSIS — I8312 Varicose veins of left lower extremity with inflammation: Secondary | ICD-10-CM | POA: Diagnosis not present

## 2022-12-06 DIAGNOSIS — L814 Other melanin hyperpigmentation: Secondary | ICD-10-CM | POA: Diagnosis not present

## 2022-12-06 DIAGNOSIS — Z85828 Personal history of other malignant neoplasm of skin: Secondary | ICD-10-CM | POA: Diagnosis not present

## 2022-12-06 DIAGNOSIS — L821 Other seborrheic keratosis: Secondary | ICD-10-CM | POA: Diagnosis not present

## 2023-01-30 DIAGNOSIS — E7800 Pure hypercholesterolemia, unspecified: Secondary | ICD-10-CM | POA: Diagnosis not present

## 2023-01-30 LAB — HEPATIC FUNCTION PANEL
ALT: 46 IU/L — ABNORMAL HIGH (ref 0–32)
AST: 49 IU/L — ABNORMAL HIGH (ref 0–40)
Albumin: 3.8 g/dL (ref 3.8–4.8)
Alkaline Phosphatase: 78 IU/L (ref 44–121)
Bilirubin Total: 0.3 mg/dL (ref 0.0–1.2)
Bilirubin, Direct: 0.12 mg/dL (ref 0.00–0.40)
Total Protein: 6.1 g/dL (ref 6.0–8.5)

## 2023-01-31 ENCOUNTER — Telehealth: Payer: Self-pay | Admitting: *Deleted

## 2023-01-31 DIAGNOSIS — E7800 Pure hypercholesterolemia, unspecified: Secondary | ICD-10-CM

## 2023-01-31 MED ORDER — ROSUVASTATIN CALCIUM 20 MG PO TABS: 20.0000 mg | ORAL_TABLET | Freq: Every day | ORAL | 3 refills | Status: DC

## 2023-01-31 NOTE — Telephone Encounter (Signed)
Pt has reviewed results via my chart  New script sent to the pharmacy  Lab orders mailed to the pt  

## 2023-01-31 NOTE — Telephone Encounter (Signed)
-----   Message from [name], MD sent at 01/30/2023  4:43 PM EDT ----- Minimal elevation in liver functions.  Decrease Crestor to 20 mg daily and continue Zetia 10 mg daily.  Check lipids and liver in 8 weeks. [name]

## 2023-02-02 ENCOUNTER — Telehealth: Payer: Self-pay | Admitting: Internal Medicine

## 2023-02-02 NOTE — Telephone Encounter (Signed)
Contacted Signa Kell [name] to schedule their annual wellness visit. Appointment made for 02/16/2023.  [facility] Care Guide [facility] AWV TEAM Direct Dial: [phone]

## 2023-02-06 ENCOUNTER — Encounter: Payer: Self-pay | Admitting: Internal Medicine

## 2023-02-06 NOTE — Patient Instructions (Addendum)
Blood work was ordered.   The lab is on the first floor.    Medications changes include :   none     Return in about 6 months (around 08/10/2023) for follow up.   Health Maintenance, Female Adopting a healthy lifestyle and getting preventive care are important in promoting health and wellness. Ask your health care provider about: The right schedule for you to have regular tests and exams. Things you can do on your own to prevent diseases and keep yourself healthy. What should I know about diet, weight, and exercise? Eat a healthy diet  Eat a diet that includes plenty of vegetables, fruits, low-fat dairy products, and lean protein. Do not eat a lot of foods that are high in solid fats, added sugars, or sodium. Maintain a healthy weight Body mass index (BMI) is used to identify weight problems. It estimates body fat based on height and weight. Your health care provider can help determine your BMI and help you achieve or maintain a healthy weight. Get regular exercise Get regular exercise. This is one of the most important things you can do for your health. Most adults should: Exercise for at least 150 minutes each week. The exercise should increase your heart rate and make you sweat (moderate-intensity exercise). Do strengthening exercises at least twice a week. This is in addition to the moderate-intensity exercise. Spend less time sitting. Even light physical activity can be beneficial. Watch cholesterol and blood lipids Have your blood tested for lipids and cholesterol at 75 years of age, then have this test every 5 years. Have your cholesterol levels checked more often if: Your lipid or cholesterol levels are high. You are older than 75 years of age. You are at high risk for heart disease. What should I know about cancer screening? Depending on your health history and family history, you may need to have cancer screening at various ages. This may include screening  for: Breast cancer. Cervical cancer. Colorectal cancer. Skin cancer. Lung cancer. What should I know about heart disease, diabetes, and high blood pressure? Blood pressure and heart disease High blood pressure causes heart disease and increases the risk of stroke. This is more likely to develop in people who have high blood pressure readings or are overweight. Have your blood pressure checked: Every 3-5 years if you are 89-19 years of age. Every year if you are 45 years old or older. Diabetes Have regular diabetes screenings. This checks your fasting blood sugar level. Have the screening done: Once every three years after age 64 if you are at a normal weight and have a low risk for diabetes. More often and at a younger age if you are overweight or have a high risk for diabetes. What should I know about preventing infection? Hepatitis B If you have a higher risk for hepatitis B, you should be screened for this virus. Talk with your health care provider to find out if you are at risk for hepatitis B infection. Hepatitis C Testing is recommended for: Everyone born from [birth year] through 1963. Anyone with known risk factors for hepatitis C. Sexually transmitted infections (STIs) Get screened for STIs, including gonorrhea and chlamydia, if: You are sexually active and are younger than 75 years of age. You are older than 75 years of age and your health care provider tells you that you are at risk for this type of infection. Your sexual activity has changed since you were last screened, and you are at  increased risk for chlamydia or gonorrhea. Ask your health care provider if you are at risk. Ask your health care provider about whether you are at high risk for HIV. Your health care provider may recommend a prescription medicine to help prevent HIV infection. If you choose to take medicine to prevent HIV, you should first get tested for HIV. You should then be tested every 3 months for as long as you  are taking the medicine. Pregnancy If you are about to stop having your period (premenopausal) and you may become pregnant, seek counseling before you get pregnant. Take 400 to 800 micrograms (mcg) of folic acid every day if you become pregnant. Ask for birth control (contraception) if you want to prevent pregnancy. Osteoporosis and menopause Osteoporosis is a disease in which the bones lose minerals and strength with aging. This can result in bone fractures. If you are 44 years old or older, or if you are at risk for osteoporosis and fractures, ask your health care provider if you should: Be screened for bone loss. Take a calcium or vitamin D supplement to lower your risk of fractures. Be given hormone replacement therapy (HRT) to treat symptoms of menopause. Follow these instructions at home: Alcohol use Do not drink alcohol if: Your health care provider tells you not to drink. You are pregnant, may be pregnant, or are planning to become pregnant. If you drink alcohol: Limit how much you have to: 0-1 drink a day. Know how much alcohol is in your drink. In the [place] one drink equals one 12 oz bottle of beer (355 mL), one 5 oz glass of wine (148 mL), or one 1 oz glass of hard liquor (44 mL). Lifestyle Do not use any products that contain nicotine or tobacco. These products include cigarettes, chewing tobacco, and vaping devices, such as e-cigarettes. If you need help quitting, ask your health care provider. Do not use street drugs. Do not share needles. Ask your health care provider for help if you need support or information about quitting drugs. General instructions Schedule regular health, dental, and eye exams. Stay current with your vaccines. Tell your health care provider if: You often feel depressed. You have ever been abused or do not feel safe at home. Summary Adopting a healthy lifestyle and getting preventive care are important in promoting health and wellness. Follow your  health care provider's instructions about healthy diet, exercising, and getting tested or screened for diseases. Follow your health care provider's instructions on monitoring your cholesterol and blood pressure. This information is not intended to replace advice given to you by your health care provider. Make sure you discuss any questions you have with your health care provider. Document Revised: 02/01/2021 Document Reviewed: 02/01/2021 Elsevier Patient Education  2023 [employer].

## 2023-02-06 NOTE — Progress Notes (Unsigned)
Subjective:    Patient ID: [name], female    DOB: 07/19/1946, 77 y.o.   MRN: [id number]      HPI [name] is here for a Physical exam and her chronic medical problems.    Doing ok. No concerns.    Medications and allergies reviewed with patient and updated if appropriate.  Current Outpatient Medications on File Prior to Visit  Medication Sig Dispense Refill   aspirin 81 MG tablet Take 81 mg by mouth daily.     ezetimibe (ZETIA) 10 MG tablet Take 1 tablet (10 mg total) by mouth daily. 90 tablet 3   levothyroxine (SYNTHROID) 50 MCG tablet TAKE 1 TABLET BY MOUTH EVERY DAY 90 tablet 3   metoprolol succinate (TOPROL-XL) 25 MG 24 hr tablet TAKE 1 TABLET(25 MG) BY MOUTH DAILY 90 tablet 1   rosuvastatin (CRESTOR) 20 MG tablet Take 1 tablet (20 mg total) by mouth daily. 90 tablet 3   ticagrelor (BRILINTA) 90 MG TABS tablet Take 1 tablet (90 mg total) by mouth 2 (two) times daily. 60 tablet 11   No current facility-administered medications on file prior to visit.    Review of Systems  Constitutional:  Negative for fever.  Eyes:  Negative for visual disturbance.  Respiratory:  Positive for shortness of breath (with walking). Negative for cough and wheezing.   Cardiovascular:  Positive for leg swelling. Negative for chest pain and palpitations.  Gastrointestinal:  Negative for abdominal pain, blood in stool, constipation and diarrhea.       No gerd  Genitourinary:  Negative for dysuria.  Musculoskeletal:  Positive for arthralgias and back pain (central lower back - no radiation).  Skin:  Negative for rash.  Neurological:  Negative for light-headedness and headaches.  Psychiatric/Behavioral:  Negative for dysphoric mood. The patient is nervous/anxious.        Objective:   Vitals:   02/07/23 1117  BP: 132/68  Pulse: (!) 58  Temp: 98.1 F (36.7 C)  SpO2: 98%   Filed Weights   02/07/23 1117  Weight: 249 lb (112.9 kg)   Body mass index is 37.86 kg/m.  BP  Readings from Last 3 Encounters:  02/07/23 132/68  10/12/22 (!) 142/78  09/14/22 138/78    Wt Readings from Last 3 Encounters:  02/07/23 249 lb (112.9 kg)  10/12/22 242 lb (109.8 kg)  09/14/22 243 lb (110.2 kg)       Physical Exam Constitutional: She appears well-developed and well-nourished. No distress.  HENT:  Head: Normocephalic and atraumatic.  Right Ear: External ear normal. Normal ear canal and TM Left Ear: External ear normal.  Normal ear canal and TM Mouth/Throat: Oropharynx is clear and moist.  Eyes: Conjunctivae normal.  Neck: Neck supple. No tracheal deviation present. No thyromegaly present.  No carotid bruit  Cardiovascular: Normal rate, regular rhythm and normal heart sounds.   No murmur heard.  No edema. Pulmonary/Chest: Effort normal and breath sounds normal. No respiratory distress. She has no wheezes. She has no rales.  Breast: deferred   Abdominal: Soft. She exhibits no distension. There is no tenderness.  Lymphadenopathy: She has no cervical adenopathy.  Skin: Skin is warm and dry. She is not diaphoretic.  Psychiatric: She has a normal mood and affect. Her behavior is normal.     Lab Results  Component Value Date   WBC 18.2 (H) 09/08/2022   HGB 11.3 (L) 09/08/2022   HCT 33.9 (L) 09/08/2022   PLT 191 09/08/2022   GLUCOSE  137 (H) 09/28/2022   CHOL 124 10/12/2022   TRIG 105 10/12/2022   HDL 48 10/12/2022   LDLDIRECT 152.1 06/26/2013   LDLCALC 57 10/12/2022   ALT 46 (H) 01/30/2023   AST 49 (H) 01/30/2023   NA 141 09/28/2022   K 4.1 09/28/2022   CL 105 09/28/2022   CREATININE 1.37 (H) 09/28/2022   BUN 13 09/28/2022   CO2 28 09/28/2022   TSH 2.99 01/31/2022   HGBA1C 5.8 01/31/2022   MICROALBUR 0.9 06/26/2013         Assessment & Plan:   Physical exam: Screening blood work  ordered Exercise  yard work -- no exercise-stressed the importance of regular exercise-start walking Weight  encouraged weight loss Substance abuse   none   Reviewed recommended immunizations.   Health Maintenance  Topic Date Due   DTaP/Tdap/Td (1 - Tdap) Never done   Diabetic kidney evaluation - Urine ACR  06/26/2014   COVID-19 Vaccine (3 - 2023-24 season) 02/23/2023 (Originally 05/27/2022)   Zoster Vaccines- Shingrix (1 of 2) 05/10/2023 (Originally 10/01/1997)   Medicare Annual Wellness (AWV)  04/13/2023   INFLUENZA VACCINE  04/27/2023   Diabetic kidney evaluation - eGFR measurement  09/29/2023   MAMMOGRAM  09/28/2024   DEXA SCAN  09/29/2027   COLONOSCOPY (Pts 45-24yrs Insurance coverage will need to be confirmed)  06/05/2029   Pneumonia Vaccine 73+ Years old  Completed   Hepatitis C Screening  Completed   HPV VACCINES  Aged Out          See Problem List for Assessment and Plan of chronic medical problems.

## 2023-02-07 ENCOUNTER — Ambulatory Visit (INDEPENDENT_AMBULATORY_CARE_PROVIDER_SITE_OTHER): Payer: PPO | Admitting: Internal Medicine

## 2023-02-07 VITALS — BP 132/68 | HR 58 | Temp 98.1°F | Ht 68.0 in | Wt 249.0 lb

## 2023-02-07 DIAGNOSIS — E7800 Pure hypercholesterolemia, unspecified: Secondary | ICD-10-CM

## 2023-02-07 DIAGNOSIS — E039 Hypothyroidism, unspecified: Secondary | ICD-10-CM | POA: Diagnosis not present

## 2023-02-07 DIAGNOSIS — Z0000 Encounter for general adult medical examination without abnormal findings: Secondary | ICD-10-CM | POA: Diagnosis not present

## 2023-02-07 DIAGNOSIS — R7303 Prediabetes: Secondary | ICD-10-CM | POA: Diagnosis not present

## 2023-02-07 DIAGNOSIS — I2510 Atherosclerotic heart disease of native coronary artery without angina pectoris: Secondary | ICD-10-CM

## 2023-02-07 DIAGNOSIS — R7989 Other specified abnormal findings of blood chemistry: Secondary | ICD-10-CM | POA: Diagnosis not present

## 2023-02-07 DIAGNOSIS — I700 Atherosclerosis of aorta: Secondary | ICD-10-CM

## 2023-02-07 DIAGNOSIS — N1831 Chronic kidney disease, stage 3a: Secondary | ICD-10-CM

## 2023-02-07 LAB — COMPREHENSIVE METABOLIC PANEL
ALT: 40 U/L — ABNORMAL HIGH (ref 0–35)
AST: 33 U/L (ref 0–37)
Albumin: 3.7 g/dL (ref 3.5–5.2)
Alkaline Phosphatase: 67 U/L (ref 39–117)
BUN: 22 mg/dL (ref 6–23)
CO2: 30 mEq/L (ref 19–32)
Calcium: 9.2 mg/dL (ref 8.4–10.5)
Chloride: 103 mEq/L (ref 96–112)
Creatinine, Ser: 1.15 mg/dL (ref 0.40–1.20)
GFR: 46.65 mL/min — ABNORMAL LOW (ref >60.00)
Glucose, Bld: 176 mg/dL — ABNORMAL HIGH (ref 70–99)
Potassium: 4.5 mEq/L (ref 3.5–5.1)
Sodium: 139 mEq/L (ref 135–145)
Total Bilirubin: 0.4 mg/dL (ref 0.2–1.2)
Total Protein: 6.7 g/dL (ref 6.0–8.3)

## 2023-02-07 LAB — LIPID PANEL
Cholesterol: 119 mg/dL (ref 0–200)
HDL: 55.2 mg/dL (ref >39.00)
LDL Cholesterol: 45 mg/dL (ref 0–99)
NonHDL: 64.13
Total CHOL/HDL Ratio: 2
Triglycerides: 96 mg/dL (ref 0.0–149.0)
VLDL: 19.2 mg/dL (ref 0.0–40.0)

## 2023-02-07 LAB — CBC WITH DIFFERENTIAL/PLATELET
Basophils Absolute: 0 10*3/uL (ref 0.0–0.1)
Basophils Relative: 0.7 % (ref 0.0–3.0)
Eosinophils Absolute: 0.1 10*3/uL (ref 0.0–0.7)
Eosinophils Relative: 1.5 % (ref 0.0–5.0)
HCT: 38.5 % (ref 36.0–46.0)
Hemoglobin: 12.9 g/dL (ref 12.0–15.0)
Lymphocytes Relative: 37.6 % (ref 12.0–46.0)
Lymphs Abs: 2.3 10*3/uL (ref 0.7–4.0)
MCHC: 33.6 g/dL (ref 30.0–36.0)
MCV: 89.4 fl (ref 78.0–100.0)
Monocytes Absolute: 0.5 10*3/uL (ref 0.1–1.0)
Monocytes Relative: 8.1 % (ref 3.0–12.0)
Neutro Abs: 3.2 10*3/uL (ref 1.4–7.7)
Neutrophils Relative %: 52.1 % (ref 43.0–77.0)
Platelets: 144 10*3/uL — ABNORMAL LOW (ref 150.0–400.0)
RBC: 4.31 Mil/uL (ref 3.87–5.11)
RDW: 12.9 % (ref 11.5–15.5)
WBC: 6.1 10*3/uL (ref 4.0–10.5)

## 2023-02-07 LAB — HEMOGLOBIN A1C: Hgb A1c MFr Bld: 7.2 % — ABNORMAL HIGH (ref 4.6–6.5)

## 2023-02-07 LAB — TSH: TSH: 1.52 u[IU]/mL (ref 0.35–5.50)

## 2023-02-07 NOTE — Assessment & Plan Note (Signed)
Chronic ?Low sugar / carb diet ?Stressed regular exercise ?Encouraged weight loss ? ?Lab Results  ?Component Value Date  ? HGBA1C 5.8 01/31/2022  ? ? ? ? ?

## 2023-02-07 NOTE — Assessment & Plan Note (Signed)
Chronic Does not takes nsaids Continue increased fluids Encouraged weight loss Cmp, cbc

## 2023-02-07 NOTE — Assessment & Plan Note (Signed)
Chronic Check lipid panel, cmp Continue crestor 20 mg daily, zetia 10 mg daily Regular exercise and healthy diet encouraged

## 2023-02-07 NOTE — Assessment & Plan Note (Signed)
Chronic  Clinically euthyroid Continue taking levothyroxine 50 mcg daily Will titrate dose if needed  Lab Results  Component Value Date   TSH 2.99 01/31/2022

## 2023-02-07 NOTE — Assessment & Plan Note (Signed)
Initially elevated when she was very sick with RSV, pneumonia and respiratory failure LFTs have improved, but with most recent blood work still slightly elevated Recheck today She has been told she has a fatty liver in the past and that likely is contributing

## 2023-02-07 NOTE — Assessment & Plan Note (Addendum)
Chronic Lab Results  Component Value Date   LDLCALC 57 10/12/2022   Last LDL at goal Continue crestor 20 mg daily, Zetia 10 mg daily ,ASA 81 mg daily Discussed importance of healthy diet low in fats/cholesterol, regular exercise, weight loss

## 2023-02-07 NOTE — Assessment & Plan Note (Signed)
Chronic S/p PCI On brilinta, asa 81 mg, crestor and zetia No symptoms c/w angina Advised regular exercise, healthy diet Cbc, cmp, lipid

## 2023-02-10 ENCOUNTER — Encounter: Payer: Self-pay | Admitting: Internal Medicine

## 2023-02-15 NOTE — Progress Notes (Unsigned)
I connected with  [name] on 02/16/2023 by a audio enabled telemedicine application and verified that I am speaking with the correct person using two identifiers.  Patient Location: Home  Provider Location: Home Office  I discussed the limitations of evaluation and management by telemedicine. The patient expressed understanding and agreed to proceed.   Subjective:   [name] is a 77 y.o. female who presents for Medicare Annual (Subsequent) preventive examination.  Review of Systems    Per HPI unless specifically indicated below.  Cardiac Risk Factors include: advanced age (>38men, >9 women);female gender          Objective:       02/07/2023   11:17 AM 10/12/2022    9:39 AM 09/14/2022    2:17 PM  Vitals with BMI  Height 5\' 8"   5\' 8"   Weight 249 lbs 242 lbs 243 lbs  BMI 37.87  36.96  Systolic 132 142 409  Diastolic 68 78 78  Pulse 58 72 63    There were no vitals filed for this visit. There is no height or weight on file to calculate BMI.     02/16/2023    8:54 AM 09/05/2022    4:29 PM 09/04/2022   11:19 PM 08/29/2022    7:23 AM 07/26/2022    1:37 PM 05/13/2022    2:51 PM 04/25/2022    5:36 AM  Advanced Directives  Does Patient Have a Medical Advance Directive? No  No No No No No  Would patient like information on creating a medical advance directive? No - Patient declined No - Patient declined   No - Patient declined No - Patient declined No - Patient declined    Current Medications (verified) Outpatient Encounter Medications as of 02/16/2023  Medication Sig   aspirin 81 MG tablet Take 81 mg by mouth daily.   ezetimibe (ZETIA) 10 MG tablet Take 1 tablet (10 mg total) by mouth daily.   levothyroxine (SYNTHROID) 50 MCG tablet TAKE 1 TABLET BY MOUTH EVERY DAY   metoprolol succinate (TOPROL-XL) 25 MG 24 hr tablet TAKE 1 TABLET(25 MG) BY MOUTH DAILY   rosuvastatin (CRESTOR) 20 MG tablet Take 1 tablet (20 mg total) by mouth daily.    ticagrelor (BRILINTA) 90 MG TABS tablet Take 1 tablet (90 mg total) by mouth 2 (two) times daily.   No facility-administered encounter medications on file as of 02/16/2023.    Allergies (verified) Atorvastatin and Augmentin [amoxicillin-pot clavulanate]   History: 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 ( Dr Danella Deis)   Subclinical hypothyroidism 05/17/2011   Venous insufficiency of right leg 06/23/2017   Follows with vascular   Weakness    Past Surgical History:  Procedure Laterality Date   CHOLECYSTECTOMY N/A 06/13/2017   Procedure: LAPAROSCOPIC CHOLECYSTECTOMY;  Surgeon: Ancil Linsey, MD;  Location: [facility] ORS;  Service: General;  Laterality: N/A;   COLONOSCOPY     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/25/2022   Procedure: CORONARY STENT INTERVENTION;  Surgeon: Lennette Bihari, MD;  Location: [facility];  Service: Cardiovascular;  Laterality: N/A;  LEFT HEART CATH AND CORONARY ANGIOGRAPHY N/A 04/25/2022   Procedure: LEFT HEART CATH AND CORONARY ANGIOGRAPHY;  Surgeon: Lennette Bihari, MD;  Location: [facility];  Service: Cardiovascular;  Laterality: N/A;   MASS EXCISION     right upper inner thigh or cyst   WISDOM TOOTH EXTRACTION     Family History  Problem Relation Age of Onset   COPD Mother        deceased 11-13-08   Alzheimer's disease Father        deceased September 13, 2009   Coronary artery disease Father        s/p cabg   Breast cancer Maternal Grandmother    Colon cancer Neg Hx    Colon polyps Neg Hx    Esophageal cancer Neg Hx    Rectal cancer Neg Hx    Stomach cancer Neg Hx    Social History   Socioeconomic History   Marital status: Widowed     Spouse name: Not on file   Number of children: 0   Years of education: 42   Highest education level: 12th grade  Occupational History   Occupation: Retired  Tobacco Use   Smoking status: Never   Smokeless tobacco: Never  Vaping Use   Vaping Use: Never used  Substance and Sexual Activity   Alcohol use: No   Drug use: No   Sexual activity: Not on file  Other Topics Concern   Not on file  Social History Narrative   Retired Presenter, broadcasting for [employer]   Widowed (husband - Renae Fickle), no children   Never Smoked    Alcohol use-no      Daily Caffeine Use 2/day   Social Determinants of Health   Financial Resource Strain: Low Risk  (02/16/2023)   Overall Financial Resource Strain (CARDIA)    Difficulty of Paying Living Expenses: Not hard at all  Food Insecurity: No Food Insecurity (02/16/2023)   Hunger Vital Sign    Worried About Running Out of Food in the Last Year: Never true    Ran Out of Food in the Last Year: Never true  Transportation Needs: No Transportation Needs (02/16/2023)   PRAPARE - Administrator, Civil Service (Medical): No    Lack of Transportation (Non-Medical): No  Physical Activity: Sufficiently Active (02/16/2023)   Exercise Vital Sign    Days of Exercise per Week: 3 days    Minutes of Exercise per Session: 50 min  Stress: No Stress Concern Present (02/16/2023)   [employer] of Occupational Health - Occupational Stress Questionnaire    Feeling of Stress : Only a little  Social Connections: Moderately Integrated (02/16/2023)   Social Connection and Isolation Panel [NHANES]    Frequency of Communication with Friends and Family: More than three times a week    Frequency of Social Gatherings with Friends and Family: More than three times a week    Attends Religious Services: 1 to 4 times per year    Active Member of [employer] or Organizations: Yes    Attends Banker Meetings: 1 to 4 times per year    Marital Status:  Widowed    Tobacco Counseling Counseling given: No   Clinical Intake:  Pre-visit preparation completed: No  Pain : No/denies pain     Nutritional Status: BMI > 30  Obese Diabetes: No  How often do you need to have someone help you when you read instructions, pamphlets, or other written materials from your doctor  or pharmacy?: 1 - Never  Diabetic?No  Interpreter Needed?: No  Information entered by :: Laurel Dimmer, CMA   Activities of Daily Living    02/16/2023    8:45 AM 09/05/2022    4:16 PM  In your present state of health, do you have any difficulty performing the following activities:  Hearing? 0 0  Vision? 0 0  Difficulty concentrating or making decisions? 0 0  Walking or climbing stairs? 0 0  Dressing or bathing? 0 0  Doing errands, shopping? 0 1    Patient Care Team: Pincus Sanes, MD as PCP - General (Internal Medicine) Jens Som Madolyn Frieze, MD as PCP - Cardiology (Cardiology) Tyrone Schimke, MD as Consulting Physician (Ophthalmology) Althea Charon, MD (Radiology)  Indicate any recent Medical Services you may have received from other than [facility] providers in the past year (date may be approximate).     Assessment:   This is a routine wellness examination for [name] Hearing/Vision screen Denies any hearing issues. Denies any change to her vision. Annual Eye Exam. [facility]   Dietary issues and exercise activities discussed: Current Exercise Habits: Structured exercise class, Time (Minutes): 45, Frequency (Times/Week): 3, Weekly Exercise (Minutes/Week): 135, Intensity: Moderate, Exercise limited by: None identified   Goals Addressed             This Visit's Progress    Stay Active and Independent       Why is this important?    Activity helps to keep your muscles strong.  You will sleep better and feel more relaxed.  You will have more energy and feel less stressed.  If you are not active now, start slowly. Little  changes make a big difference.  Rest, but not too much.  Stay as active as you can and listen to your body's signals.            Depression Screen    02/16/2023    8:44 AM 02/16/2023    8:43 AM 09/14/2022    2:29 PM 09/02/2022    2:55 PM 06/30/2022   10:07 AM 05/23/2022    3:20 PM 04/12/2022    9:57 AM  PHQ 2/9 Scores  PHQ - 2 Score 0 0 0 0 0 2 0  PHQ- 9 Score   4 4  4      Fall Risk    02/16/2023    8:44 AM 09/14/2022    2:29 PM 09/02/2022    2:43 PM 06/30/2022   10:07 AM 05/13/2022    2:14 PM  Fall Risk   Falls in the past year? 0 0 0 0 0  Number falls in past yr: 0 0 0 0 0  Injury with Fall? 0 0 0 0 0  Risk for fall due to : No Fall Risks No Fall Risks No Fall Risks No Fall Risks   Follow up Falls evaluation completed Falls evaluation completed Falls evaluation completed Falls evaluation completed     FALL RISK PREVENTION PERTAINING TO THE HOME:  Any stairs in or around the home? No  If so, are there any without handrails? No  Home free of loose throw rugs in walkways, pet beds, electrical cords, etc? Yes  Adequate lighting in your home to reduce risk of falls? Yes   ASSISTIVE DEVICES UTILIZED TO PREVENT FALLS:  Life alert? No  Use of a cane, walker or w/c? No  Grab bars in the bathroom? Yes  Shower chair or bench in shower? Yes  Elevated  toilet seat or a handicapped toilet? Yes   TIMED UP AND GO:  Was the test performed?Unable to perform, virtual appointment   Cognitive Function:  Patient is alert and oriented x3.  Denies difficulty focusing, making decisions, memory loss.  MMSE/6CIT deferred. Normal by direct communication/observation.        04/12/2022   10:03 AM 01/28/2020    8:15 AM  6CIT Screen  What Year? 0 points 0 points  What month? 0 points 0 points  What time? 0 points 0 points  Count back from 20 0 points 0 points  Months in reverse 0 points 0 points  Repeat phrase 0 points 0 points  Total Score 0 points 0 points     Immunizations Immunization History  Administered Date(s) Administered   Influenza Whole 06/26/2008, 07/02/2009   Influenza, High Dose Seasonal PF 06/23/2017, 07/26/2018, 06/28/2019   Influenza-Unspecified 06/07/2016, 06/17/2022   PFIZER(Purple Top)SARS-COV-2 Vaccination 05/21/2020, 06/11/2020   Pneumococcal Conjugate-13 06/23/2017   Pneumococcal Polysaccharide-23 07/09/2013    TDAP status: Due, Education has been provided regarding the importance of this vaccine. Advised may receive this vaccine at local pharmacy or Health Dept. Aware to provide a copy of the vaccination record if obtained from local pharmacy or Health Dept. Verbalized acceptance and understanding.  Flu Vaccine status: Up to date  Pneumococcal vaccine status: Up to date  Covid-19 vaccine status: Information provided on how to obtain vaccines.   Qualifies for Shingles Vaccine? Yes   Zostavax completed No   Shingrix Completed?: No.    Education has been provided regarding the importance of this vaccine. Patient has been advised to call insurance company to determine out of pocket expense if they have not yet received this vaccine. Advised may also receive vaccine at local pharmacy or Health Dept. Verbalized acceptance and understanding.  Screening Tests Health Maintenance  Topic Date Due   DTaP/Tdap/Td (1 - Tdap) Never done   Diabetic kidney evaluation - Urine ACR  06/26/2014   COVID-19 Vaccine (3 - 2023-24 season) 02/23/2023 (Originally 05/27/2022)   Zoster Vaccines- Shingrix (1 of 2) 05/10/2023 (Originally 10/01/1997)   INFLUENZA VACCINE  04/27/2023   Diabetic kidney evaluation - eGFR measurement  02/07/2024   Medicare Annual Wellness (AWV)  02/16/2024   MAMMOGRAM  09/28/2024   DEXA SCAN  09/29/2027   COLONOSCOPY (Pts 45-32yrs Insurance coverage will need to be confirmed)  06/05/2029   Pneumonia Vaccine 5+ Years old  Completed   Hepatitis C Screening  Completed   HPV VACCINES  Aged Out    Health  Maintenance  Health Maintenance Due  Topic Date Due   DTaP/Tdap/Td (1 - Tdap) Never done   Diabetic kidney evaluation - Urine ACR  06/26/2014    Colorectal cancer screening: Type of screening: Colonoscopy. Completed 06/06/2019. Repeat every 10 years  Mammogram status: Completed 09/28/2022. Repeat every year  DEXA Scan: 09/28/2022  Lung Cancer Screening: (Low Dose CT Chest recommended if Age 47-80 years, 30 pack-year currently smoking OR have quit w/in 15years.) does not qualify.   Lung Cancer Screening Referral: not applicable   Additional Screening:  Hepatitis C Screening: does qualify; Completed 06/22/2016  Vision Screening: Recommended annual ophthalmology exams for early detection of glaucoma and other disorders of the eye. Is the patient up to date with their annual eye exam?  Yes  Who is the provider or what is the name of the office in which the patient attends annual eye exams? Dr. Delaney Meigs , [facility]  If pt is not established  with a provider, would they like to be referred to a provider to establish care? No .   Dental Screening: Recommended annual dental exams for proper oral hygiene  Community Resource Referral / Chronic Care Management: CRR required this visit?  No   CCM required this visit?  No      Plan:     I have personally reviewed and noted the following in the patient's chart:   Medical and social history Use of alcohol, tobacco or illicit drugs  Current medications and supplements including opioid prescriptions. Patient is not currently taking opioid prescriptions. Functional ability and status Nutritional status Physical activity Advanced directives List of other physicians Hospitalizations, surgeries, and ER visits in previous 12 months Vitals Screenings to include cognitive, depression, and falls Referrals and appointments  In addition, I have reviewed and discussed with patient certain preventive protocols, quality metrics,  and best practice recommendations. A written personalized care plan for preventive services as well as general preventive health recommendations were provided to patient.    Ms. [name] , Thank you for taking time to come for your Medicare Wellness Visit. I appreciate your ongoing commitment to your health goals. Please review the following plan we discussed and let me know if I can assist you in the future.   These are the goals we discussed:  Goals      Stay Active and Independent     Why is this important?    Activity helps to keep your muscles strong.  You will sleep better and feel more relaxed.  You will have more energy and feel less stressed.  If you are not active now, start slowly. Little changes make a big difference.  Rest, but not too much.  Stay as active as you can and listen to your body's signals.             This is a list of the screening recommended for you and due dates:  Health Maintenance  Topic Date Due   DTaP/Tdap/Td vaccine (1 - Tdap) Never done   Yearly kidney health urinalysis for diabetes  06/26/2014   COVID-19 Vaccine (3 - 2023-24 season) 02/23/2023*   Zoster (Shingles) Vaccine (1 of 2) 05/10/2023*   Flu Shot  04/27/2023   Yearly kidney function blood test for diabetes  02/07/2024   Medicare Annual Wellness Visit  02/16/2024   Mammogram  09/28/2024   DEXA scan (bone density measurement)  09/29/2027   Colon Cancer Screening  06/05/2029   Pneumonia Vaccine  Completed   Hepatitis C Screening: USPSTF Recommendation to screen - Ages 18-79 yo.  Completed   HPV Vaccine  Aged Out  *Topic was postponed. The date shown is not the original due date.     Ms. [name] , Thank you for taking time to come for your Medicare Wellness Visit. I appreciate your ongoing commitment to your health goals. Please review the following plan we discussed and let me know if I can assist you in the future.   These are the goals we discussed:  Goals       Stay Active and Independent     Why is this important?    Activity helps to keep your muscles strong.  You will sleep better and feel more relaxed.  You will have more energy and feel less stressed.  If you are not active now, start slowly. Little changes make a big difference.  Rest, but not too much.  Stay as active as you can  and listen to your body's signals.             This is a list of the screening recommended for you and due dates:  Health Maintenance  Topic Date Due   DTaP/Tdap/Td vaccine (1 - Tdap) Never done   Yearly kidney health urinalysis for diabetes  06/26/2014   COVID-19 Vaccine (3 - 2023-24 season) 02/23/2023*   Zoster (Shingles) Vaccine (1 of 2) 05/10/2023*   Flu Shot  04/27/2023   Yearly kidney function blood test for diabetes  02/07/2024   Medicare Annual Wellness Visit  02/16/2024   Mammogram  09/28/2024   DEXA scan (bone density measurement)  09/29/2027   Colon Cancer Screening  06/05/2029   Pneumonia Vaccine  Completed   Hepatitis C Screening: USPSTF Recommendation to screen - Ages 18-79 yo.  Completed   HPV Vaccine  Aged Out  *Topic was postponed. The date shown is not the original due date.     Lonna Cobb, CMA   02/16/2023   Nurse Notes: Approximately 30 minute Non-Face -To-Face Medicare Wellness Visit

## 2023-02-15 NOTE — Patient Instructions (Signed)

## 2023-02-16 ENCOUNTER — Ambulatory Visit (INDEPENDENT_AMBULATORY_CARE_PROVIDER_SITE_OTHER): Payer: PPO

## 2023-02-16 DIAGNOSIS — Z0000 Encounter for general adult medical examination without abnormal findings: Secondary | ICD-10-CM | POA: Diagnosis not present

## 2023-04-04 DIAGNOSIS — E7800 Pure hypercholesterolemia, unspecified: Secondary | ICD-10-CM | POA: Diagnosis not present

## 2023-04-05 LAB — LIPID PANEL
Chol/HDL Ratio: 2.4 ratio (ref 0.0–4.4)
Cholesterol, Total: 131 mg/dL (ref 100–199)
HDL: 55 mg/dL (ref >39)
LDL Chol Calc (NIH): 58 mg/dL (ref 0–99)
Triglycerides: 93 mg/dL (ref 0–149)
VLDL Cholesterol Cal: 18 mg/dL (ref 5–40)

## 2023-04-07 ENCOUNTER — Encounter: Payer: Self-pay | Admitting: *Deleted

## 2023-04-11 ENCOUNTER — Other Ambulatory Visit: Payer: Self-pay | Admitting: Cardiovascular Disease

## 2023-04-27 ENCOUNTER — Telehealth: Payer: Self-pay | Admitting: Cardiology

## 2023-04-27 NOTE — Telephone Encounter (Signed)
Returned pt call in regards to her topping her Brilinta. Patient stopped the medication yesterday. Pt states July 31st was a year and the provider told her she could stop taking it at 1 year post op. She wanted to let the provider know.

## 2023-04-27 NOTE — Telephone Encounter (Signed)
This is correct per last office note.

## 2023-04-27 NOTE — Telephone Encounter (Signed)
Pt c/o medication issue:  1. Name of Medication:   ticagrelor (BRILINTA) 90 MG TABS tablet    2. How are you currently taking this medication (dosage and times per day)? Stopped taking as of yesterday  3. Are you having a reaction (difficulty breathing--STAT)? No   4. What is your medication issue? Patient is calling to report she stopped this medication as of yesterday due to being advised she only needed to take it for a year after her stent which was placed 04/25/22. Please advise.

## 2023-05-18 ENCOUNTER — Other Ambulatory Visit: Payer: Self-pay | Admitting: Cardiology

## 2023-05-18 DIAGNOSIS — R072 Precordial pain: Secondary | ICD-10-CM

## 2023-08-01 ENCOUNTER — Telehealth: Payer: Self-pay | Admitting: Internal Medicine

## 2023-08-01 ENCOUNTER — Other Ambulatory Visit: Payer: Self-pay

## 2023-08-01 DIAGNOSIS — N1831 Chronic kidney disease, stage 3a: Secondary | ICD-10-CM

## 2023-08-01 DIAGNOSIS — E782 Mixed hyperlipidemia: Secondary | ICD-10-CM

## 2023-08-01 DIAGNOSIS — R7303 Prediabetes: Secondary | ICD-10-CM

## 2023-08-01 DIAGNOSIS — I2510 Atherosclerotic heart disease of native coronary artery without angina pectoris: Secondary | ICD-10-CM

## 2023-08-01 DIAGNOSIS — E7800 Pure hypercholesterolemia, unspecified: Secondary | ICD-10-CM

## 2023-08-01 DIAGNOSIS — E039 Hypothyroidism, unspecified: Secondary | ICD-10-CM

## 2023-08-01 NOTE — Telephone Encounter (Signed)
Patient has an appointment with Dr. Lawerance Bach on 08/09/2023. She would like to know if she can have labs done prior to that appointment. Patient would like a call back at [phone].

## 2023-08-01 NOTE — Telephone Encounter (Signed)
Spoke with patient and labs ordered.  Appointment made for this Thursday for lab drawl.

## 2023-08-03 ENCOUNTER — Other Ambulatory Visit (INDEPENDENT_AMBULATORY_CARE_PROVIDER_SITE_OTHER): Payer: PPO

## 2023-08-03 DIAGNOSIS — N1831 Chronic kidney disease, stage 3a: Secondary | ICD-10-CM

## 2023-08-03 DIAGNOSIS — I2510 Atherosclerotic heart disease of native coronary artery without angina pectoris: Secondary | ICD-10-CM

## 2023-08-03 DIAGNOSIS — R7303 Prediabetes: Secondary | ICD-10-CM | POA: Diagnosis not present

## 2023-08-03 DIAGNOSIS — E7800 Pure hypercholesterolemia, unspecified: Secondary | ICD-10-CM | POA: Diagnosis not present

## 2023-08-03 DIAGNOSIS — E039 Hypothyroidism, unspecified: Secondary | ICD-10-CM

## 2023-08-03 LAB — CBC WITH DIFFERENTIAL/PLATELET
Basophils Absolute: 0 10*3/uL (ref 0.0–0.1)
Basophils Relative: 0.8 % (ref 0.0–3.0)
Eosinophils Absolute: 0.1 10*3/uL (ref 0.0–0.7)
Eosinophils Relative: 2 % (ref 0.0–5.0)
HCT: 38.5 % (ref 36.0–46.0)
Hemoglobin: 12.9 g/dL (ref 12.0–15.0)
Lymphocytes Relative: 31.8 % (ref 12.0–46.0)
Lymphs Abs: 1.8 10*3/uL (ref 0.7–4.0)
MCHC: 33.4 g/dL (ref 30.0–36.0)
MCV: 87.3 fL (ref 78.0–100.0)
Monocytes Absolute: 0.4 10*3/uL (ref 0.1–1.0)
Monocytes Relative: 7.4 % (ref 3.0–12.0)
Neutro Abs: 3.2 10*3/uL (ref 1.4–7.7)
Neutrophils Relative %: 58 % (ref 43.0–77.0)
Platelets: 136 10*3/uL — ABNORMAL LOW (ref 150.0–400.0)
RBC: 4.41 Mil/uL (ref 3.87–5.11)
RDW: 13.8 % (ref 11.5–15.5)
WBC: 5.5 10*3/uL (ref 4.0–10.5)

## 2023-08-03 LAB — COMPREHENSIVE METABOLIC PANEL
ALT: 20 U/L (ref 0–35)
AST: 21 U/L (ref 0–37)
Albumin: 3.7 g/dL (ref 3.5–5.2)
Alkaline Phosphatase: 58 U/L (ref 39–117)
BUN: 17 mg/dL (ref 6–23)
CO2: 29 meq/L (ref 19–32)
Calcium: 9.1 mg/dL (ref 8.4–10.5)
Chloride: 105 meq/L (ref 96–112)
Creatinine, Ser: 0.99 mg/dL (ref 0.40–1.20)
GFR: 55.65 mL/min — ABNORMAL LOW (ref >60.00)
Glucose, Bld: 133 mg/dL — ABNORMAL HIGH (ref 70–99)
Potassium: 5.3 meq/L — ABNORMAL HIGH (ref 3.5–5.1)
Sodium: 140 meq/L (ref 135–145)
Total Bilirubin: 0.4 mg/dL (ref 0.2–1.2)
Total Protein: 6.5 g/dL (ref 6.0–8.3)

## 2023-08-03 LAB — HEMOGLOBIN A1C: Hgb A1c MFr Bld: 7.4 % — ABNORMAL HIGH (ref 4.6–6.5)

## 2023-08-03 LAB — LIPID PANEL
Cholesterol: 130 mg/dL (ref 0–200)
HDL: 58 mg/dL (ref >39.00)
LDL Cholesterol: 57 mg/dL (ref 0–99)
NonHDL: 71.82
Total CHOL/HDL Ratio: 2
Triglycerides: 76 mg/dL (ref 0.0–149.0)
VLDL: 15.2 mg/dL (ref 0.0–40.0)

## 2023-08-03 LAB — TSH: TSH: 1.92 u[IU]/mL (ref 0.35–5.50)

## 2023-08-07 ENCOUNTER — Encounter: Payer: Self-pay | Admitting: Internal Medicine

## 2023-08-07 NOTE — Progress Notes (Unsigned)
      Subjective:    Patient ID: [name], female    DOB: 1946/04/28, 76 y.o.   MRN: [id number]     HPI [name] is here for follow up of her chronic medical problems.  Review labs  Discuss dx of DM  Medications and allergies reviewed with patient and updated if appropriate.  Current Outpatient Medications on File Prior to Visit  Medication Sig Dispense Refill   aspirin 81 MG tablet Take 81 mg by mouth daily.     ezetimibe (ZETIA) 10 MG tablet TAKE 1 TABLET(10 MG) BY MOUTH DAILY 90 tablet 3   levothyroxine (SYNTHROID) 50 MCG tablet TAKE 1 TABLET BY MOUTH EVERY DAY 90 tablet 3   metoprolol succinate (TOPROL-XL) 25 MG 24 hr tablet TAKE 1 TABLET(25 MG) BY MOUTH DAILY 90 tablet 1   rosuvastatin (CRESTOR) 20 MG tablet Take 1 tablet (20 mg total) by mouth daily. 90 tablet 3   ticagrelor (BRILINTA) 90 MG TABS tablet Take 1 tablet (90 mg total) by mouth 2 (two) times daily. 60 tablet 11   No current facility-administered medications on file prior to visit.     Review of Systems     Objective:  There were no vitals filed for this visit. BP Readings from Last 3 Encounters:  02/07/23 132/68  10/12/22 (!) 142/78  09/14/22 138/78   Wt Readings from Last 3 Encounters:  02/07/23 249 lb (112.9 kg)  10/12/22 242 lb (109.8 kg)  09/14/22 243 lb (110.2 kg)   There is no height or weight on file to calculate BMI.    Physical Exam     Lab Results  Component Value Date   WBC 5.5 08/03/2023   HGB 12.9 08/03/2023   HCT 38.5 08/03/2023   PLT 136.0 (L) 08/03/2023   GLUCOSE 133 (H) 08/03/2023   CHOL 130 08/03/2023   TRIG 76.0 08/03/2023   HDL 58.00 08/03/2023   LDLDIRECT 152.1 06/26/2013   LDLCALC 57 08/03/2023   ALT 20 08/03/2023   AST 21 08/03/2023   NA 140 08/03/2023   K 5.3 No hemolysis seen (H) 08/03/2023   CL 105 08/03/2023   CREATININE 0.99 08/03/2023   BUN 17 08/03/2023   CO2 29 08/03/2023   TSH 1.92 08/03/2023   HGBA1C 7.4 (H) 08/03/2023    MICROALBUR 0.9 06/26/2013     Assessment & Plan:    See Problem List for Assessment and Plan of chronic medical problems.

## 2023-08-07 NOTE — Patient Instructions (Addendum)
      Blood work was ordered.   The lab is on the first floor.    Medications changes include :       A referral was ordered and someone will call you to schedule an appointment.     Return in about 6 months (around 02/05/2024) for Physical Exam.

## 2023-08-08 ENCOUNTER — Ambulatory Visit: Payer: PPO | Admitting: Internal Medicine

## 2023-08-08 VITALS — BP 128/76 | HR 78 | Temp 98.4°F | Ht 68.0 in | Wt 266.0 lb

## 2023-08-08 DIAGNOSIS — E7800 Pure hypercholesterolemia, unspecified: Secondary | ICD-10-CM

## 2023-08-08 DIAGNOSIS — E1169 Type 2 diabetes mellitus with other specified complication: Secondary | ICD-10-CM | POA: Diagnosis not present

## 2023-08-08 DIAGNOSIS — R600 Localized edema: Secondary | ICD-10-CM

## 2023-08-08 DIAGNOSIS — E039 Hypothyroidism, unspecified: Secondary | ICD-10-CM

## 2023-08-08 DIAGNOSIS — I2510 Atherosclerotic heart disease of native coronary artery without angina pectoris: Secondary | ICD-10-CM

## 2023-08-08 DIAGNOSIS — D696 Thrombocytopenia, unspecified: Secondary | ICD-10-CM | POA: Diagnosis not present

## 2023-08-08 DIAGNOSIS — N1831 Chronic kidney disease, stage 3a: Secondary | ICD-10-CM | POA: Diagnosis not present

## 2023-08-08 MED ORDER — SERTRALINE HCL 50 MG PO TABS: 50.0000 mg | ORAL_TABLET | Freq: Every day | ORAL | 3 refills | Status: DC

## 2023-08-08 NOTE — Assessment & Plan Note (Addendum)
New  Low sugar / carb diet stressed-she will work on decreasing her sugar intake Stressed regular exercise Encouraged weight loss Check urine microalbumin prior to next visit  Lab Results  Component Value Date   HGBA1C 7.4 (H) 08/03/2023    Consider Jardiance or Marcelline Deist

## 2023-08-08 NOTE — Assessment & Plan Note (Signed)
Chronic GFR has improved Does not takes nsaids Continue increased fluids Encouraged weight loss

## 2023-08-08 NOTE — Assessment & Plan Note (Signed)
Chronic S/p PCI On brilinta, asa 81 mg, crestor and zetia No symptoms c/w angina Advised regular exercise, healthy diet

## 2023-08-08 NOTE — Assessment & Plan Note (Signed)
Subacute States bilateral ankle edema Wonders about a water pill Advise low-sodium diet, compression socks, elevating legs, exercise, weight loss If no improvement in 3 months can consider water pill versus starting [place] or Jardiance for diabetes which may help with edema

## 2023-08-08 NOTE — Assessment & Plan Note (Signed)
Chronic Mild Reassured Will monitor

## 2023-08-08 NOTE — Assessment & Plan Note (Addendum)
Chronic Lab Results  Component Value Date   LDLCALC 57 08/03/2023   Continue crestor 20 mg daily, zetia 10 mg daily Regular exercise and healthy diet encouraged

## 2023-08-08 NOTE — Assessment & Plan Note (Addendum)
Chronic  Clinically euthyroid Continue taking levothyroxine 50 mcg daily  Lab Results  Component Value Date   TSH 1.92 08/03/2023

## 2023-08-09 ENCOUNTER — Ambulatory Visit: Payer: PPO | Admitting: Internal Medicine

## 2023-11-01 ENCOUNTER — Other Ambulatory Visit (INDEPENDENT_AMBULATORY_CARE_PROVIDER_SITE_OTHER): Payer: PPO

## 2023-11-01 DIAGNOSIS — E1169 Type 2 diabetes mellitus with other specified complication: Secondary | ICD-10-CM | POA: Diagnosis not present

## 2023-11-01 DIAGNOSIS — D696 Thrombocytopenia, unspecified: Secondary | ICD-10-CM | POA: Diagnosis not present

## 2023-11-01 DIAGNOSIS — N1831 Chronic kidney disease, stage 3a: Secondary | ICD-10-CM

## 2023-11-01 LAB — BASIC METABOLIC PANEL
BUN: 15 mg/dL (ref 6–23)
CO2: 29 meq/L (ref 19–32)
Calcium: 9 mg/dL (ref 8.4–10.5)
Chloride: 104 meq/L (ref 96–112)
Creatinine, Ser: 0.98 mg/dL (ref 0.40–1.20)
GFR: 56.24 mL/min — ABNORMAL LOW (ref >60.00)
Glucose, Bld: 104 mg/dL — ABNORMAL HIGH (ref 70–99)
Potassium: 4.4 meq/L (ref 3.5–5.1)
Sodium: 142 meq/L (ref 135–145)

## 2023-11-01 LAB — MICROALBUMIN / CREATININE URINE RATIO
Creatinine,U: 122.9 mg/dL
Microalb Creat Ratio: 0.9 mg/g (ref 0.0–30.0)
Microalb, Ur: 1.1 mg/dL (ref 0.0–1.9)

## 2023-11-01 LAB — CBC WITH DIFFERENTIAL/PLATELET
Basophils Absolute: 0.1 10*3/uL (ref 0.0–0.1)
Basophils Relative: 1 % (ref 0.0–3.0)
Eosinophils Absolute: 0.1 10*3/uL (ref 0.0–0.7)
Eosinophils Relative: 2 % (ref 0.0–5.0)
HCT: 40.6 % (ref 36.0–46.0)
Hemoglobin: 13.3 g/dL (ref 12.0–15.0)
Lymphocytes Relative: 36 % (ref 12.0–46.0)
Lymphs Abs: 2 10*3/uL (ref 0.7–4.0)
MCHC: 32.8 g/dL (ref 30.0–36.0)
MCV: 88.6 fL (ref 78.0–100.0)
Monocytes Absolute: 0.5 10*3/uL (ref 0.1–1.0)
Monocytes Relative: 9.4 % (ref 3.0–12.0)
Neutro Abs: 2.9 10*3/uL (ref 1.4–7.7)
Neutrophils Relative %: 51.6 % (ref 43.0–77.0)
Platelets: 130 10*3/uL — ABNORMAL LOW (ref 150.0–400.0)
RBC: 4.57 Mil/uL (ref 3.87–5.11)
RDW: 14.5 % (ref 11.5–15.5)
WBC: 5.6 10*3/uL (ref 4.0–10.5)

## 2023-11-01 LAB — HEMOGLOBIN A1C: Hgb A1c MFr Bld: 6.3 % (ref 4.6–6.5)

## 2023-11-06 ENCOUNTER — Encounter: Payer: Self-pay | Admitting: Internal Medicine

## 2023-11-06 NOTE — Patient Instructions (Addendum)
      Blood work was ordered.       Medications changes include :   stop sertraline - see how you do without it.     Return in about 3 months (around 02/04/2024) for Physical Exam.

## 2023-11-06 NOTE — Progress Notes (Signed)
Subjective:    Patient ID: [name], female    DOB: 11-20-1945, 78 y.o.   MRN: [id number]     HPI [name] is here for follow up of her chronic medical problems.  Had labs - reviewed  Has been exercising regularly and has been eating very well.  She is also lost weight.  Medications and allergies reviewed with patient and updated if appropriate.  Current Outpatient Medications on File Prior to Visit  Medication Sig Dispense Refill   aspirin 81 MG tablet Take 81 mg by mouth daily.     ezetimibe (ZETIA) 10 MG tablet TAKE 1 TABLET(10 MG) BY MOUTH DAILY 90 tablet 3   levothyroxine (SYNTHROID) 50 MCG tablet TAKE 1 TABLET BY MOUTH EVERY DAY 90 tablet 3   metoprolol succinate (TOPROL-XL) 25 MG 24 hr tablet TAKE 1 TABLET(25 MG) BY MOUTH DAILY 90 tablet 1   rosuvastatin (CRESTOR) 20 MG tablet Take 1 tablet (20 mg total) by mouth daily. 90 tablet 3   sertraline (ZOLOFT) 50 MG tablet Take 1 tablet (50 mg total) by mouth daily. 30 tablet 3   ticagrelor (BRILINTA) 90 MG TABS tablet Take 1 tablet (90 mg total) by mouth 2 (two) times daily. 60 tablet 11   No current facility-administered medications on file prior to visit.     Review of Systems  Constitutional:  Negative for fever.  Respiratory:  Negative for cough, shortness of breath and wheezing.   Cardiovascular:  Negative for chest pain, palpitations and leg swelling.  Neurological:  Negative for light-headedness and headaches.       Objective:   Vitals:   11/07/23 1018  BP: 120/78  Pulse: 74  Temp: 98.1 F (36.7 C)  SpO2: 96%   BP Readings from Last 3 Encounters:  11/07/23 120/78  08/08/23 128/76  02/07/23 132/68   Wt Readings from Last 3 Encounters:  11/07/23 245 lb (111.1 kg)  08/08/23 266 lb (120.7 kg)  02/07/23 249 lb (112.9 kg)   Body mass index is 37.25 kg/m.    Physical Exam Constitutional:      General: She is not in acute distress.    Appearance: Normal appearance.  HENT:      Head: Normocephalic and atraumatic.  Eyes:     Conjunctiva/sclera: Conjunctivae normal.  Cardiovascular:     Rate and Rhythm: Normal rate and regular rhythm.     Heart sounds: Murmur (2/6 sys) heard.  Pulmonary:     Effort: Pulmonary effort is normal. No respiratory distress.     Breath sounds: Normal breath sounds. No wheezing.  Musculoskeletal:     Cervical back: Neck supple.     Right lower leg: No edema.     Left lower leg: No edema.  Lymphadenopathy:     Cervical: No cervical adenopathy.  Skin:    General: Skin is warm and dry.     Findings: No rash.  Neurological:     Mental Status: She is alert. Mental status is at baseline.  Psychiatric:        Mood and Affect: Mood normal.        Behavior: Behavior normal.        Lab Results  Component Value Date   WBC 5.6 11/01/2023   HGB 13.3 11/01/2023   HCT 40.6 11/01/2023   PLT 130.0 (L) 11/01/2023   GLUCOSE 104 (H) 11/01/2023   CHOL 130 08/03/2023   TRIG 76.0 08/03/2023   HDL 58.00 08/03/2023   LDLDIRECT  152.1 06/26/2013   LDLCALC 57 08/03/2023   ALT 20 08/03/2023   AST 21 08/03/2023   NA 142 11/01/2023   K 4.4 11/01/2023   CL 104 11/01/2023   CREATININE 0.98 11/01/2023   BUN 15 11/01/2023   CO2 29 11/01/2023   TSH 1.92 08/03/2023   HGBA1C 6.3 11/01/2023   MICROALBUR 1.1 11/01/2023     Assessment & Plan:    See Problem List for Assessment and Plan of chronic medical problems.

## 2023-11-07 ENCOUNTER — Ambulatory Visit: Payer: PPO | Admitting: Internal Medicine

## 2023-11-07 VITALS — BP 120/78 | HR 74 | Temp 98.1°F | Ht 68.0 in | Wt 245.0 lb

## 2023-11-07 DIAGNOSIS — D696 Thrombocytopenia, unspecified: Secondary | ICD-10-CM | POA: Diagnosis not present

## 2023-11-07 DIAGNOSIS — I2510 Atherosclerotic heart disease of native coronary artery without angina pectoris: Secondary | ICD-10-CM | POA: Diagnosis not present

## 2023-11-07 DIAGNOSIS — E1169 Type 2 diabetes mellitus with other specified complication: Secondary | ICD-10-CM

## 2023-11-07 DIAGNOSIS — E7800 Pure hypercholesterolemia, unspecified: Secondary | ICD-10-CM

## 2023-11-07 DIAGNOSIS — F32A Depression, unspecified: Secondary | ICD-10-CM

## 2023-11-07 DIAGNOSIS — N1831 Chronic kidney disease, stage 3a: Secondary | ICD-10-CM

## 2023-11-07 DIAGNOSIS — F419 Anxiety disorder, unspecified: Secondary | ICD-10-CM | POA: Diagnosis not present

## 2023-11-07 DIAGNOSIS — E039 Hypothyroidism, unspecified: Secondary | ICD-10-CM | POA: Diagnosis not present

## 2023-11-07 NOTE — Assessment & Plan Note (Signed)
Chronic Lab Results  Component Value Date   LDLCALC 57 08/03/2023   LDL at goal Continue crestor 20 mg daily, zetia 10 mg daily Regular exercise and healthy diet encouraged

## 2023-11-07 NOTE — Assessment & Plan Note (Signed)
Chronic Mild Fairly stable Will monitor every 6 months

## 2023-11-07 NOTE — Assessment & Plan Note (Signed)
Chronic GFR has improved over the last 6 months Does not takes nsaids Continue increased fluids Encouraged weight loss

## 2023-11-07 NOTE — Assessment & Plan Note (Addendum)
Chronic S/p PCI On asa 81 mg, crestor 20 mg and zetia No symptoms c/w angina Advised regular exercise, healthy diet

## 2023-11-07 NOTE — Assessment & Plan Note (Signed)
Chronic-diagnosed last year Sugars currently controlled Low sugar / carb diet  Stressed regular exercise Encouraged weight loss  Lab Results  Component Value Date   HGBA1C 6.3 11/01/2023    Continue lifestyle control

## 2023-11-07 NOTE — Assessment & Plan Note (Signed)
Chronic  Clinically euthyroid Continue taking levothyroxine 50 mcg daily  Lab Results  Component Value Date   TSH 1.92 08/03/2023

## 2023-11-07 NOTE — Assessment & Plan Note (Signed)
Chronic Currently taking sertraline 50 mg daily This medication is causing slight tremor and loose stools She overall she feels good and her lifestyle has changed which I think is helping not Discontinue sertraline and monitor how she feels If she becomes anxious and depressed mood and feels she needs medication can try low-dose Lexapro

## 2023-12-05 ENCOUNTER — Other Ambulatory Visit: Payer: Self-pay | Admitting: Internal Medicine

## 2023-12-13 ENCOUNTER — Telehealth: Payer: Self-pay

## 2023-12-13 NOTE — Telephone Encounter (Unsigned)
 Copied from CRM [id number]. Topic: Clinical - Medication Question >> Dec 13, 2023  1:11 PM Isabell A wrote: Reason for CRM: Patient would like to know why her heart burn medication refill was denied, wants to confirm if Dr.Burns feels like she should be off of this medication - she doesn't know the name of the medication, but will call back.

## 2023-12-14 ENCOUNTER — Other Ambulatory Visit: Payer: Self-pay

## 2023-12-14 NOTE — Telephone Encounter (Signed)
 Spoke with patient. She currently does not need medication but return call to office if she needs a refill.

## 2023-12-25 DIAGNOSIS — R92323 Mammographic fibroglandular density, bilateral breasts: Secondary | ICD-10-CM | POA: Diagnosis not present

## 2023-12-25 DIAGNOSIS — N644 Mastodynia: Secondary | ICD-10-CM | POA: Diagnosis not present

## 2023-12-25 LAB — HM MAMMOGRAPHY

## 2024-01-02 ENCOUNTER — Other Ambulatory Visit: Payer: Self-pay | Admitting: Cardiology

## 2024-01-02 DIAGNOSIS — E7800 Pure hypercholesterolemia, unspecified: Secondary | ICD-10-CM

## 2024-01-14 ENCOUNTER — Other Ambulatory Visit: Payer: Self-pay | Admitting: Internal Medicine

## 2024-01-15 ENCOUNTER — Other Ambulatory Visit: Payer: Self-pay

## 2024-02-01 ENCOUNTER — Other Ambulatory Visit (INDEPENDENT_AMBULATORY_CARE_PROVIDER_SITE_OTHER)

## 2024-02-01 DIAGNOSIS — D696 Thrombocytopenia, unspecified: Secondary | ICD-10-CM

## 2024-02-01 DIAGNOSIS — E039 Hypothyroidism, unspecified: Secondary | ICD-10-CM | POA: Diagnosis not present

## 2024-02-01 DIAGNOSIS — E7800 Pure hypercholesterolemia, unspecified: Secondary | ICD-10-CM

## 2024-02-01 DIAGNOSIS — E1169 Type 2 diabetes mellitus with other specified complication: Secondary | ICD-10-CM

## 2024-02-01 DIAGNOSIS — N1831 Chronic kidney disease, stage 3a: Secondary | ICD-10-CM | POA: Diagnosis not present

## 2024-02-01 DIAGNOSIS — I2510 Atherosclerotic heart disease of native coronary artery without angina pectoris: Secondary | ICD-10-CM

## 2024-02-01 LAB — CBC WITH DIFFERENTIAL/PLATELET
Basophils Absolute: 0 10*3/uL (ref 0.0–0.1)
Basophils Relative: 0.7 % (ref 0.0–3.0)
Eosinophils Absolute: 0.1 10*3/uL (ref 0.0–0.7)
Eosinophils Relative: 2 % (ref 0.0–5.0)
HCT: 37.5 % (ref 36.0–46.0)
Hemoglobin: 12.6 g/dL (ref 12.0–15.0)
Lymphocytes Relative: 39.9 % (ref 12.0–46.0)
Lymphs Abs: 1.8 10*3/uL (ref 0.7–4.0)
MCHC: 33.5 g/dL (ref 30.0–36.0)
MCV: 88.7 fl (ref 78.0–100.0)
Monocytes Absolute: 0.5 10*3/uL (ref 0.1–1.0)
Monocytes Relative: 10.2 % (ref 3.0–12.0)
Neutro Abs: 2.1 10*3/uL (ref 1.4–7.7)
Neutrophils Relative %: 47.2 % (ref 43.0–77.0)
Platelets: 127 10*3/uL — ABNORMAL LOW (ref 150.0–400.0)
RBC: 4.23 Mil/uL (ref 3.87–5.11)
RDW: 14 % (ref 11.5–15.5)
WBC: 4.5 10*3/uL (ref 4.0–10.5)

## 2024-02-01 LAB — COMPREHENSIVE METABOLIC PANEL WITH GFR
ALT: 15 U/L (ref 0–35)
AST: 19 U/L (ref 0–37)
Albumin: 3.7 g/dL (ref 3.5–5.2)
Alkaline Phosphatase: 51 U/L (ref 39–117)
BUN: 18 mg/dL (ref 6–23)
CO2: 31 meq/L (ref 19–32)
Calcium: 9.3 mg/dL (ref 8.4–10.5)
Chloride: 104 meq/L (ref 96–112)
Creatinine, Ser: 0.91 mg/dL (ref 0.40–1.20)
GFR: 61.36 mL/min (ref >60.00)
Glucose, Bld: 129 mg/dL — ABNORMAL HIGH (ref 70–99)
Potassium: 5.2 meq/L — ABNORMAL HIGH (ref 3.5–5.1)
Sodium: 140 meq/L (ref 135–145)
Total Bilirubin: 0.5 mg/dL (ref 0.2–1.2)
Total Protein: 6.4 g/dL (ref 6.0–8.3)

## 2024-02-01 LAB — LIPID PANEL
Cholesterol: 130 mg/dL (ref 0–200)
HDL: 55.8 mg/dL (ref >39.00)
LDL Cholesterol: 60 mg/dL (ref 0–99)
NonHDL: 74.49
Total CHOL/HDL Ratio: 2
Triglycerides: 72 mg/dL (ref 0.0–149.0)
VLDL: 14.4 mg/dL (ref 0.0–40.0)

## 2024-02-01 LAB — TSH: TSH: 2 u[IU]/mL (ref 0.35–5.50)

## 2024-02-01 LAB — HEMOGLOBIN A1C: Hgb A1c MFr Bld: 6.4 % (ref 4.6–6.5)

## 2024-02-05 NOTE — Progress Notes (Unsigned)
 Subjective:    Patient ID: [name], female    DOB: Jun 30, 1946, 78 y.o.   MRN: [id number]     HPI [name] is here for follow up of her chronic medical problems.  Had labs done  Walking for exercise.  She is also doing a lot of yard work.  Since she has been doing more yard work she has not been walking as much but knows she needs to get back to doing that regularly.  Medications and allergies reviewed with patient and updated if appropriate.  Current Outpatient Medications on File Prior to Visit  Medication Sig Dispense Refill   Apoaequorin (PREVAGEN PO) Take by mouth.     aspirin  81 MG tablet Take 81 mg by mouth daily.     ezetimibe  (ZETIA ) 10 MG tablet TAKE 1 TABLET(10 MG) BY MOUTH DAILY 90 tablet 3   levothyroxine  (SYNTHROID ) 50 MCG tablet TAKE 1 TABLET BY MOUTH EVERY DAY 90 tablet 3   metoprolol  succinate (TOPROL -XL) 25 MG 24 hr tablet TAKE 1 TABLET(25 MG) BY MOUTH DAILY 90 tablet 1   rosuvastatin  (CRESTOR ) 20 MG tablet TAKE 1 TABLET(20 MG) BY MOUTH DAILY 30 tablet 0   No current facility-administered medications on file prior to visit.     Review of Systems  Constitutional:  Negative for fever.  Respiratory:  Negative for cough, shortness of breath and wheezing.   Cardiovascular:  Positive for leg swelling (occ - salt intake or if feet long hours). Negative for chest pain and palpitations.  Neurological:  Negative for light-headedness and headaches.       Objective:   Vitals:   02/06/24 0853  BP: 120/78  Pulse: (!) 58  Temp: 98.1 F (36.7 C)  SpO2: 97%   BP Readings from Last 3 Encounters:  02/06/24 120/78  11/07/23 120/78  08/08/23 128/76   Wt Readings from Last 3 Encounters:  02/06/24 245 lb (111.1 kg)  11/07/23 245 lb (111.1 kg)  08/08/23 266 lb (120.7 kg)   Body mass index is 37.25 kg/m.    Physical Exam Constitutional:      General: She is not in acute distress.    Appearance: Normal appearance.  HENT:     Head:  Normocephalic and atraumatic.  Eyes:     Conjunctiva/sclera: Conjunctivae normal.  Cardiovascular:     Rate and Rhythm: Normal rate and regular rhythm.     Heart sounds: Normal heart sounds.  Pulmonary:     Effort: Pulmonary effort is normal. No respiratory distress.     Breath sounds: Normal breath sounds. No wheezing.  Musculoskeletal:     Cervical back: Neck supple.     Right lower leg: No edema.     Left lower leg: No edema.  Lymphadenopathy:     Cervical: No cervical adenopathy.  Skin:    General: Skin is warm and dry.     Findings: No rash.  Neurological:     Mental Status: She is alert. Mental status is at baseline.  Psychiatric:        Mood and Affect: Mood normal.        Behavior: Behavior normal.        Lab Results  Component Value Date   WBC 4.5 02/01/2024   HGB 12.6 02/01/2024   HCT 37.5 02/01/2024   PLT 127.0 (L) 02/01/2024   GLUCOSE 129 (H) 02/01/2024   CHOL 130 02/01/2024   TRIG 72.0 02/01/2024   HDL 55.80 02/01/2024   LDLDIRECT  152.1 06/26/2013   LDLCALC 60 02/01/2024   ALT 15 02/01/2024   AST 19 02/01/2024   NA 140 02/01/2024   K 5.2 No hemolysis seen (H) 02/01/2024   CL 104 02/01/2024   CREATININE 0.91 02/01/2024   BUN 18 02/01/2024   CO2 31 02/01/2024   TSH 2.00 02/01/2024   HGBA1C 6.4 02/01/2024   MICROALBUR 1.1 11/01/2023     Assessment & Plan:    See Problem List for Assessment and Plan of chronic medical problems.

## 2024-02-05 NOTE — Patient Instructions (Addendum)
      Blood work was ordered.       Medications changes include :   None    A referral was ordered and someone will call you to schedule an appointment.     Return in about 6 months (around 08/08/2024) for Physical Exam.

## 2024-02-06 ENCOUNTER — Ambulatory Visit (INDEPENDENT_AMBULATORY_CARE_PROVIDER_SITE_OTHER): Payer: PPO | Admitting: Internal Medicine

## 2024-02-06 ENCOUNTER — Encounter: Payer: Self-pay | Admitting: Internal Medicine

## 2024-02-06 VITALS — BP 120/78 | HR 58 | Temp 98.1°F | Ht 68.0 in | Wt 245.0 lb

## 2024-02-06 DIAGNOSIS — E039 Hypothyroidism, unspecified: Secondary | ICD-10-CM | POA: Diagnosis not present

## 2024-02-06 DIAGNOSIS — N1831 Chronic kidney disease, stage 3a: Secondary | ICD-10-CM

## 2024-02-06 DIAGNOSIS — D696 Thrombocytopenia, unspecified: Secondary | ICD-10-CM

## 2024-02-06 DIAGNOSIS — E1169 Type 2 diabetes mellitus with other specified complication: Secondary | ICD-10-CM

## 2024-02-06 DIAGNOSIS — I2510 Atherosclerotic heart disease of native coronary artery without angina pectoris: Secondary | ICD-10-CM

## 2024-02-06 DIAGNOSIS — I700 Atherosclerosis of aorta: Secondary | ICD-10-CM

## 2024-02-06 DIAGNOSIS — R600 Localized edema: Secondary | ICD-10-CM | POA: Diagnosis not present

## 2024-02-06 DIAGNOSIS — E7800 Pure hypercholesterolemia, unspecified: Secondary | ICD-10-CM

## 2024-02-06 MED ORDER — OMEPRAZOLE 20 MG PO CPDR: DELAYED_RELEASE_CAPSULE | ORAL | Status: DC

## 2024-02-06 NOTE — Assessment & Plan Note (Addendum)
 Chronic-started about 1 year ago Mild Slight decline over the past 1 year Not sure of exact cause-this does worry her Discussed heme-onc referral, but will hold off and to see what it looks like in 6 months

## 2024-02-06 NOTE — Assessment & Plan Note (Signed)
 Chronic GFR has improved Does not takes nsaids Continue increased fluids Encouraged weight loss

## 2024-02-06 NOTE — Assessment & Plan Note (Signed)
 Chronic Lab Results  Component Value Date   LDLCALC 60 02/01/2024   LDL at goal Continue crestor  20 mg daily, zetia  10 mg daily Regular exercise and healthy diet encouraged

## 2024-02-06 NOTE — Assessment & Plan Note (Signed)
 Chronic Intermittent  Mild Uses compression socks in cooler weather Advise low-sodium diet, compression socks, elevating legs, exercise, weight loss

## 2024-02-06 NOTE — Assessment & Plan Note (Signed)
 Chronic Lab Results  Component Value Date   LDLCALC 60 02/01/2024   Last LDL at goal Continue crestor  20 mg daily, Zetia  10 mg daily ,ASA 81 mg daily Discussed importance of healthy diet low in fats/cholesterol, regular exercise, weight loss

## 2024-02-06 NOTE — Assessment & Plan Note (Signed)
 Chronic Sugars currently controlled  Lab Results  Component Value Date   HGBA1C 6.4 02/01/2024   Low sugar / carb diet  Stressed regular exercise Encouraged weight loss Continue lifestyle control

## 2024-02-06 NOTE — Assessment & Plan Note (Signed)
 Chronic  Clinically euthyroid Continue taking levothyroxine  50 mcg daily  Lab Results  Component Value Date   TSH 2.00 02/01/2024

## 2024-02-06 NOTE — Assessment & Plan Note (Signed)
 Chronic S/p PCI On asa 81 mg, crestor 20 mg and zetia No symptoms c/w angina Advised regular exercise, healthy diet

## 2024-02-11 ENCOUNTER — Other Ambulatory Visit: Payer: Self-pay | Admitting: Cardiology

## 2024-02-11 DIAGNOSIS — E7800 Pure hypercholesterolemia, unspecified: Secondary | ICD-10-CM

## 2024-02-12 ENCOUNTER — Other Ambulatory Visit (HOSPITAL_COMMUNITY): Payer: Self-pay

## 2024-02-12 ENCOUNTER — Other Ambulatory Visit: Payer: Self-pay

## 2024-02-12 DIAGNOSIS — E7800 Pure hypercholesterolemia, unspecified: Secondary | ICD-10-CM

## 2024-02-12 MED ORDER — ROSUVASTATIN CALCIUM 20 MG PO TABS
20.0000 mg | ORAL_TABLET | Freq: Every day | ORAL | 0 refills | Status: DC
  Filled 2024-02-12: qty 30, 30d supply, fill #0

## 2024-02-13 ENCOUNTER — Other Ambulatory Visit (HOSPITAL_COMMUNITY): Payer: Self-pay

## 2024-02-13 DIAGNOSIS — H04221 Epiphora due to insufficient drainage, right lacrimal gland: Secondary | ICD-10-CM | POA: Diagnosis not present

## 2024-02-14 DIAGNOSIS — L820 Inflamed seborrheic keratosis: Secondary | ICD-10-CM | POA: Diagnosis not present

## 2024-02-14 DIAGNOSIS — I872 Venous insufficiency (chronic) (peripheral): Secondary | ICD-10-CM | POA: Diagnosis not present

## 2024-02-14 DIAGNOSIS — I8312 Varicose veins of left lower extremity with inflammation: Secondary | ICD-10-CM | POA: Diagnosis not present

## 2024-02-14 DIAGNOSIS — L570 Actinic keratosis: Secondary | ICD-10-CM | POA: Diagnosis not present

## 2024-02-14 DIAGNOSIS — L821 Other seborrheic keratosis: Secondary | ICD-10-CM | POA: Diagnosis not present

## 2024-02-14 DIAGNOSIS — I8311 Varicose veins of right lower extremity with inflammation: Secondary | ICD-10-CM | POA: Diagnosis not present

## 2024-02-14 DIAGNOSIS — Z85828 Personal history of other malignant neoplasm of skin: Secondary | ICD-10-CM | POA: Diagnosis not present

## 2024-02-23 ENCOUNTER — Other Ambulatory Visit: Payer: Self-pay | Admitting: Cardiology

## 2024-02-23 DIAGNOSIS — R072 Precordial pain: Secondary | ICD-10-CM

## 2024-02-29 ENCOUNTER — Ambulatory Visit

## 2024-02-29 ENCOUNTER — Encounter: Payer: PPO | Admitting: Internal Medicine

## 2024-02-29 VITALS — Ht 68.0 in | Wt 245.0 lb

## 2024-02-29 DIAGNOSIS — Z0000 Encounter for general adult medical examination without abnormal findings: Secondary | ICD-10-CM | POA: Diagnosis not present

## 2024-02-29 NOTE — Progress Notes (Signed)
 Subjective:   [name] is a 78 y.o. who presents for a Medicare Wellness preventive visit.  As a reminder, Annual Wellness Visits don't include a physical exam, and some assessments may be limited, especially if this visit is performed virtually. We may recommend an in-person follow-up visit with your provider if needed.  Visit Complete: Virtual I connected with  [name] on 02/29/24 by a audio enabled telemedicine application and verified that I am speaking with the correct person using two identifiers.  Patient Location: Home  Provider Location: Office/Clinic  I discussed the limitations of evaluation and management by telemedicine. The patient expressed understanding and agreed to proceed.  Vital Signs: Because this visit was a virtual/telehealth visit, some criteria may be missing or patient reported. Any vitals not documented were not able to be obtained and vitals that have been documented are patient reported.  VideoDeclined- This patient declined Librarian, academic. Therefore the visit was completed with audio only.  Persons Participating in Visit: Patient.  AWV Questionnaire: No: Patient Medicare AWV questionnaire was not completed prior to this visit.  Cardiac Risk Factors include: advanced age (>25men, >68 women);diabetes mellitus;dyslipidemia;obesity (BMI >30kg/m2)     Objective:     Today's Vitals   02/29/24 0852  Weight: 245 lb (111.1 kg)  Height: 5\' 8"  (1.727 m)   Body mass index is 37.25 kg/m.     02/29/2024    8:51 AM 02/16/2023    8:54 AM 09/05/2022    4:29 PM 09/04/2022   11:19 PM 08/29/2022    7:23 AM 07/26/2022    1:37 PM 05/13/2022    2:51 PM  Advanced Directives  Does Patient Have a Medical Advance Directive? Yes No  No No No No  Type of Estate agent of [place];Living will        Copy of Healthcare Power of Attorney in Chart? No - copy requested        Would  patient like information on creating a medical advance directive?  No - Patient declined No - Patient declined   No - Patient declined No - Patient declined    Current Medications (verified) Outpatient Encounter Medications as of 02/29/2024  Medication Sig   Apoaequorin (PREVAGEN PO) Take by mouth.   aspirin  81 MG tablet Take 81 mg by mouth daily.   ezetimibe  (ZETIA ) 10 MG tablet TAKE 1 TABLET(10 MG) BY MOUTH DAILY   levothyroxine  (SYNTHROID ) 50 MCG tablet TAKE 1 TABLET BY MOUTH EVERY DAY   metoprolol  succinate (TOPROL -XL) 25 MG 24 hr tablet TAKE 1 TABLET(25 MG) BY MOUTH DAILY   omeprazole  (PRILOSEC) 20 MG capsule TAKE 1 CAPSULE(20 MG) BY MOUTH TWICE DAILY PRN BEFORE A MEAL   rosuvastatin  (CRESTOR ) 20 MG tablet Take 1 tablet (20 mg total) by mouth daily.   No facility-administered encounter medications on file as of 02/29/2024.    Allergies (verified) Atorvastatin, Augmentin  [amoxicillin -pot clavulanate], and Sertraline    History: 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 ( Dr Alanda Allegra)   Subclinical hypothyroidism 05/17/2011   Venous insufficiency of right leg 06/23/2017  Follows with vascular   Weakness    Past Surgical History:  Procedure Laterality Date   CHOLECYSTECTOMY N/A 06/13/2017   Procedure: LAPAROSCOPIC CHOLECYSTECTOMY;  Surgeon: Franki Isles, MD;  Location: [facility] ORS;  Service: General;  Laterality: N/A;   COLONOSCOPY     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/25/2022   Procedure: CORONARY STENT INTERVENTION;  Surgeon: Millicent Ally, MD;  Location: [facility];  Service: Cardiovascular;  Laterality: N/A;   LEFT HEART CATH AND CORONARY ANGIOGRAPHY N/A  04/25/2022   Procedure: LEFT HEART CATH AND CORONARY ANGIOGRAPHY;  Surgeon: Millicent Ally, MD;  Location: [facility];  Service: Cardiovascular;  Laterality: N/A;   MASS EXCISION     right upper inner thigh or cyst   WISDOM TOOTH EXTRACTION     Family History  Problem Relation Age of Onset   COPD Mother        deceased 2008-10-09   Alzheimer's disease Father        deceased 09-Aug-2009   Coronary artery disease Father        s/p cabg   Breast cancer Maternal Grandmother    Colon cancer Neg Hx    Colon polyps Neg Hx    Esophageal cancer Neg Hx    Rectal cancer Neg Hx    Stomach cancer Neg Hx    Social History   Socioeconomic History   Marital status: Widowed    Spouse name: Not on file   Number of children: 0   Years of education: 41   Highest education level: 12th grade  Occupational History   Occupation: Retired  Tobacco Use   Smoking status: Never   Smokeless tobacco: Never  Vaping Use   Vaping status: Never Used  Substance and Sexual Activity   Alcohol use: No   Drug use: No   Sexual activity: Not Currently  Other Topics Concern   Not on file  Social History Narrative   Retired Presenter, broadcasting for [employer]   Widowed (husband - Donavon Fudge), no children   Never Smoked    Alcohol use-no      Daily Caffeine Use 2/day   Social Drivers of Corporate investment banker Strain: Low Risk  (02/29/2024)   Overall Financial Resource Strain (CARDIA)    Difficulty of Paying Living Expenses: Not hard at all  Food Insecurity: No Food Insecurity (02/29/2024)   Hunger Vital Sign    Worried About Running Out of Food in the Last Year: Never true    Ran Out of Food in the Last Year: Never true  Transportation Needs: No Transportation Needs (02/29/2024)   PRAPARE - Administrator, Civil Service (Medical): No    Lack of Transportation (Non-Medical): No  Physical Activity: Insufficiently Active (02/29/2024)   Exercise Vital Sign    Days of Exercise per Week:  3 days    Minutes of Exercise per Session: 40 min  Stress: No Stress Concern Present (02/29/2024)   [employer] of Occupational Health - Occupational Stress Questionnaire    Feeling of Stress : Not at all  Social Connections: Moderately Integrated (02/29/2024)   Social Connection and Isolation Panel [NHANES]    Frequency of Communication with Friends and Family: More than three times a week    Frequency of Social Gatherings with Friends and Family: More than three times a week    Attends Religious Services: More than 4 times per year    Active Member of [employer] or Organizations:  Yes    Attends Club or Organization Meetings: More than 4 times per year    Marital Status: Widowed    Tobacco Counseling Counseling given: No    Clinical Intake:  Pre-visit preparation completed: Yes  Pain : No/denies pain     BMI - recorded: 37.25 Nutritional Risks: None Diabetes: Yes CBG done?: No Did pt. bring in CBG monitor from home?: No  Lab Results  Component Value Date   HGBA1C 6.4 02/01/2024   HGBA1C 6.3 11/01/2023   HGBA1C 7.4 (H) 08/03/2023     How often do you need to have someone help you when you read instructions, pamphlets, or other written materials from your doctor or pharmacy?: 1 - Never  Interpreter Needed?: No  Information entered by :: Kandy Orris, CMA   Activities of Daily Living     02/29/2024    8:55 AM  In your present state of health, do you have any difficulty performing the following activities:  Hearing? 0  Vision? 0  Difficulty concentrating or making decisions? 0  Walking or climbing stairs? 0  Dressing or bathing? 0  Doing errands, shopping? 0  Preparing Food and eating ? N  Using the Toilet? N  In the past six months, have you accidently leaked urine? N  Do you have problems with loss of bowel control? N  Managing your Medications? N  Managing your Finances? N  Housekeeping or managing your Housekeeping? N    Patient Care Team: Colene Dauphin, MD as PCP - General (Internal Medicine) Audery Blazing Deannie Fabian, MD as PCP - Cardiology (Cardiology) Artemisa Lars, MD as Consulting Physician (Ophthalmology) Kandra Orn, MD (Radiology)  I have updated your Care Teams any recent Medical Services you may have received from other providers in the past year.     Assessment:    This is a routine wellness examination for Dorcus.  Hearing/Vision screen Hearing Screening - Comments:: Denies hearing difficulties   Vision Screening - Comments:: Wears rx glasses - up to date with routine eye exams with Dr Laruth Pod   Goals Addressed               This Visit's Progress     Patient Stated (pt-stated)        Patient stated that she tries to walk more and tone up        Depression Screen     02/29/2024    8:57 AM 11/07/2023   10:35 AM 02/16/2023    8:44 AM 02/16/2023    8:43 AM 09/14/2022    2:29 PM 09/02/2022    2:55 PM 06/30/2022   10:07 AM  PHQ 2/9 Scores  PHQ - 2 Score 0 0 0 0 0 0 0  PHQ- 9 Score 0    4 4     Fall Risk     02/29/2024    8:55 AM 11/07/2023   10:35 AM 02/16/2023    8:44 AM 09/14/2022    2:29 PM 09/02/2022    2:43 PM  Fall Risk   Falls in the past year? 0 0 0 0 0  Number falls in past yr: 0 0 0 0 0  Injury with Fall? 0 0 0 0 0  Risk for fall due to : No Fall Risks No Fall Risks No Fall Risks No Fall Risks No Fall Risks  Follow up Falls evaluation completed;Falls prevention discussed Falls evaluation completed Falls evaluation completed Falls evaluation completed Falls evaluation completed    MEDICARE RISK  AT HOME:  Medicare Risk at Home Any stairs in or around the home?: No If so, are there any without handrails?: No Home free of loose throw rugs in walkways, pet beds, electrical cords, etc?: Yes Adequate lighting in your home to reduce risk of falls?: Yes Life alert?: No Use of a cane, walker or w/c?: No Grab bars in the bathroom?: Yes Shower chair or bench in shower?: No Elevated  toilet seat or a handicapped toilet?: Yes  TIMED UP AND GO:  Was the test performed?  No  Cognitive Function: 6CIT completed        02/29/2024    9:07 AM 04/12/2022   10:03 AM 01/28/2020    8:15 AM  6CIT Screen  What Year? 0 points 0 points 0 points  What month? 0 points 0 points 0 points  What time? 0 points 0 points 0 points  Count back from 20 0 points 0 points 0 points  Months in reverse 0 points 0 points 0 points  Repeat phrase 0 points 0 points 0 points  Total Score 0 points 0 points 0 points    Immunizations Immunization History  Administered Date(s) Administered   Influenza Whole 06/26/2008, 07/02/2009   Influenza, High Dose Seasonal PF 06/23/2017, 07/26/2018, 06/28/2019, 07/08/2023   Influenza-Unspecified 06/07/2016, 06/17/2022   PFIZER(Purple Top)SARS-COV-2 Vaccination 05/21/2020, 06/11/2020   Pneumococcal Conjugate-13 06/23/2017   Pneumococcal Polysaccharide-23 07/09/2013   Respiratory Syncytial Virus Vaccine,Recomb Aduvanted(Arexvy) 07/08/2023    Screening Tests Health Maintenance  Topic Date Due   OPHTHALMOLOGY EXAM  Never done   COVID-19 Vaccine (3 - 2024-25 season) 05/28/2023   Zoster Vaccines- Shingrix (1 of 2) 05/08/2024 (Originally 10/01/1997)   DTaP/Tdap/Td (1 - Tdap) 02/05/2025 (Originally 10/01/1964)   INFLUENZA VACCINE  04/26/2024   HEMOGLOBIN A1C  08/03/2024   FOOT EXAM  08/07/2024   Diabetic kidney evaluation - Urine ACR  10/31/2024   Diabetic kidney evaluation - eGFR measurement  01/31/2025   Medicare Annual Wellness (AWV)  02/28/2025   MAMMOGRAM  12/24/2025   DEXA SCAN  09/29/2027   Pneumonia Vaccine 31+ Years old  Completed   Hepatitis C Screening  Completed   HPV VACCINES  Aged Out   Meningococcal B Vaccine  Aged Out   Colonoscopy  Discontinued    Health Maintenance  Health Maintenance Due  Topic Date Due   OPHTHALMOLOGY EXAM  Never done   COVID-19 Vaccine (3 - 2024-25 season) 05/28/2023   Health Maintenance Items  Addressed: 02/29/2024  Additional Screening:  Vision Screening: Recommended annual ophthalmology exams for early detection of glaucoma and other disorders of the eye.    Dental Screening: Recommended annual dental exams for proper oral hygiene  Community Resource Referral / Chronic Care Management: CRR required this visit?  No   CCM required this visit?  No   Plan:    I have personally reviewed and noted the following in the patient's chart:   Medical and social history Use of alcohol, tobacco or illicit drugs  Current medications and supplements including opioid prescriptions. Patient is not currently taking opioid prescriptions. Functional ability and status Nutritional status Physical activity Advanced directives List of other physicians Hospitalizations, surgeries, and ER visits in previous 12 months Vitals Screenings to include cognitive, depression, and falls Referrals and appointments  In addition, I have reviewed and discussed with patient certain preventive protocols, quality metrics, and best practice recommendations. A written personalized care plan for preventive services as well as general preventive health recommendations were provided to patient.  Patria Bookbinder, CMA   02/29/2024   After Visit Summary: (MyChart) Due to this being a telephonic visit, the after visit summary with patients personalized plan was offered to patient via MyChart   Notes: Nothing significant to report at this time.

## 2024-02-29 NOTE — Patient Instructions (Signed)
 Ms. [name] , Thank you for taking time out of your busy schedule to complete your Annual Wellness Visit with me. I enjoyed our conversation and look forward to speaking with you again next year. I, as well as your care team,  appreciate your ongoing commitment to your health goals. Please review the following plan we discussed and let me know if I can assist you in the future. Your Game plan/ To Do List   Follow up Visits: Next Medicare AWV with our clinical staff: 03/04/2025   Have you seen your provider in the last 6 months (3 months if uncontrolled diabetes)? Yes Next Office Visit with your provider: 08/13/2024  Clinician Recommendations:  Aim for 30 minutes of exercise or brisk walking, 6-8 glasses of water , and 5 servings of fruits and vegetables each day. Educated and advised on getting the COVID and Shingles vaccines at local pharmacy in 2025.  Tdap (Tetenus) vaccine is due 2026.        This is a list of the screening recommended for you and due dates:  Health Maintenance  Topic Date Due   Eye exam for diabetics  Never done   COVID-19 Vaccine (3 - 2024-25 season) 05/28/2023   Zoster (Shingles) Vaccine (1 of 2) 05/08/2024*   DTaP/Tdap/Td vaccine (1 - Tdap) 02/05/2025*   Flu Shot  04/26/2024   Hemoglobin A1C  08/03/2024   Complete foot exam   08/07/2024   Yearly kidney health urinalysis for diabetes  10/31/2024   Yearly kidney function blood test for diabetes  01/31/2025   Medicare Annual Wellness Visit  02/28/2025   Mammogram  12/24/2025   DEXA scan (bone density measurement)  09/29/2027   Pneumonia Vaccine  Completed   Hepatitis C Screening  Completed   HPV Vaccine  Aged Out   Meningitis B Vaccine  Aged Out   Colon Cancer Screening  Discontinued  *Topic was postponed. The date shown is not the original due date.    Advanced directives: (Copy Requested) Please bring a copy of your health care power of attorney and living will to the office to be added to your chart at  your convenience. You can mail to [facility] [address] [place], [place] [address] or email to [email or web address] Advance Care Planning is important because it:  [x]  Makes sure you receive the medical care that is consistent with your values, goals, and preferences  [x]  It provides guidance to your family and loved ones and reduces their decisional burden about whether or not they are making the right decisions based on your wishes.  Follow the link provided in your after visit summary or read over the paperwork we have mailed to you to help you started getting your Advance Directives in place. If you need assistance in completing these, please reach out to us  so that we can help you!

## 2024-03-06 ENCOUNTER — Encounter: Payer: Self-pay | Admitting: Nurse Practitioner

## 2024-03-06 ENCOUNTER — Ambulatory Visit: Attending: Nurse Practitioner | Admitting: Nurse Practitioner

## 2024-03-06 VITALS — BP 138/72 | HR 57 | Ht 68.0 in | Wt 252.0 lb

## 2024-03-06 DIAGNOSIS — E785 Hyperlipidemia, unspecified: Secondary | ICD-10-CM | POA: Diagnosis not present

## 2024-03-06 DIAGNOSIS — R7303 Prediabetes: Secondary | ICD-10-CM

## 2024-03-06 DIAGNOSIS — E669 Obesity, unspecified: Secondary | ICD-10-CM

## 2024-03-06 DIAGNOSIS — I2510 Atherosclerotic heart disease of native coronary artery without angina pectoris: Secondary | ICD-10-CM

## 2024-03-06 NOTE — Progress Notes (Signed)
 [id number]   Office Visit    Patient Name: [name] Date of Encounter: 03/06/2024  Primary Care Provider:  Colene Dauphin, MD Primary Cardiologist:  Alexandria Angel, MD  Chief Complaint    78 year old female with a history of CAD s/p DES-RCA, hyperlipidemia, prediabetes, GERD, clinical hypothyroidism, anxiety, depression, and venous insufficiency who presents for follow-up related to CAD.  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 ( Dr Alanda Allegra)   Subclinical hypothyroidism 05/17/2011   Venous insufficiency of right leg 06/23/2017   Follows with vascular   Weakness    Past Surgical History:  Procedure Laterality Date   CHOLECYSTECTOMY N/A 06/13/2017   Procedure: LAPAROSCOPIC CHOLECYSTECTOMY;  Surgeon: Franki Isles, MD;  Location: [facility] ORS;  Service: General;  Laterality: N/A;   COLONOSCOPY     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/25/2022   Procedure: CORONARY STENT INTERVENTION;  Surgeon: Millicent Ally, MD;  Location: [facility];  Service: Cardiovascular;  Laterality: N/A;   LEFT HEART CATH AND CORONARY ANGIOGRAPHY N/A 04/25/2022   Procedure: LEFT HEART CATH AND CORONARY ANGIOGRAPHY;  Surgeon: Millicent Ally, MD;  Location: [facility];  Service: Cardiovascular;  Laterality: N/A;   MASS EXCISION     right upper inner thigh or cyst   WISDOM TOOTH EXTRACTION      Allergies  Allergies  Allergen Reactions   Atorvastatin Other (See Comments)    REACTION: Upset stomach   Augmentin  [Amoxicillin -Pot Clavulanate] Diarrhea   Sertraline  Other (See Comments)    Tremor, loose stools     Labs/Other Studies Reviewed     The following studies were reviewed today:  Cardiac Studies & Procedures   ______________________________________________________________________________________________ CARDIAC CATHETERIZATION  CARDIAC CATHETERIZATION 04/25/2022  Conclusion   1st Mrg lesion is 30% stenosed.   Prox LAD to Mid LAD lesion is 20% stenosed.   Prox RCA to Mid RCA lesion is 90% stenosed.   Ost RCA to Prox RCA lesion is 20% stenosed.   A drug-eluting stent was successfully placed.   Post intervention, there is a 0% residual stenosis.   There is hyperdynamic left ventricular systolic function.   LV end diastolic pressure is moderately elevated.   The left ventricular ejection fraction is greater than 65% by visual estimate.  Multivessel coronary calcification including the left main, LAD, left circumflex and RCA with mild nonobstructive CAD with the exception of a 90% mid RCA stenosis in a dominant RCA.  Hyperdynamic LV function with EF estimate 65 to 70% without focal segmental wall motion abnormality.  LVEDP is increased at 30 mm.  Successful PCI to the mid RCA with ultimate insertion of a 3.5 x 16 mm Synergy DES stent postdilated to 3.75 mm with 90% stenosis being reduced to 0% and brisk TIMI-3 flow.  RECOMMENDATION: DAPT for minimum of 6 months but preferably at least a year.  Medical therapy for mild nonobstructive concomitant CAD.  Aggressive lipid-lowering therapy and optimal blood pressure control.  Since the patient lives by herself, she will stay overnight with plans for discharge tomorrow.  Findings Coronary Findings Diagnostic  Dominance: Right  Left Anterior Descending Prox LAD to Mid LAD lesion is 20% stenosed.  Left Circumflex  First Obtuse Marginal Branch 1st Mrg lesion is 30% stenosed.  Right Coronary Artery Ost RCA to Prox RCA lesion is 20% stenosed. Prox RCA to Mid RCA lesion is 90% stenosed.  Right Ventricular Branch Vessel is small in size.  Intervention  Prox RCA to Mid  RCA lesion Stent Pre-stent angioplasty was performed. A drug-eluting stent was successfully placed. Stent strut is well apposed. Post-Intervention Lesion Assessment The intervention was successful. Pre-interventional TIMI flow is 3. Post-intervention TIMI flow is 3. No complications occurred at this lesion. There is a 0% residual stenosis post intervention.          CT SCANS  CT CORONARY FRACTIONAL FLOW RESERVE DATA PREP 03/10/2022  Narrative CLINICAL DATA:  CAD  EXAM: FFR CT  TECHNIQUE: The best systolic and diastolic phases of the patients gated cardiac CTA sent to [facility] for hemodynamic analysis  FINDINGS: Left sided FFR was normal  FFR CT abnormal in mid/distal RCA  RCA: Proximal 98, mid vessel 0.71, distal vessel 0.71  IMPRESSION: FFR CT positive for obstructive CAD in mid RCA and corresponds to the soft plaque lesion seen on CTA  Recommend referral for heart catheterization  Janelle Mediate   Electronically Signed By: Janelle Mediate M.D. On: 03/10/2022 12:31   CT CORONARY MORPH W/CTA COR W/SCORE 03/10/2022  Addendum 03/10/2022 12:30 PM ADDENDUM REPORT: 03/10/2022 12:28  CLINICAL DATA:  Chest pain  EXAM: Cardiac CTA  MEDICATIONS: Sub lingual nitro. 4 mg and lopressor  100mg   TECHNIQUE: The patient was scanned on a [employer] 192 scanner. Gantry rotation speed was 250 msecs. Collimation was. 6 mm . A 120 kV prospective scan was triggered in the ascending thoracic aorta at 140 HU's with full mA between 30-70% of the R-R interval . Average HR during the scan was 63 bpm. The 3D data set was interpreted on a dedicated work station using MPR, MIP and VRT modes. A total of 80 cc of contrast was used.  FINDINGS: Non-cardiac: See separate report from [facility]. No significant findings on limited lung and soft tissue windows.  Calcium  score: LM 3 vessel calcium  noted  LM 170  LAD 176  LCX 45.5  RCA 28.8  Total: 420 which is 85 th  percentile for age/sex  Coronary Arteries: Right dominant with no anomalies  LM: 50% mid/distal stenosis calcified  LAD: 25-49% calcified plaque in proximal mid and distal vessel  D1: 25-49% calcified plaque ostium and proximally  D2: Small and normal  Circumflex: 25-49% mixed plaque in ostium and proximal vessel  OM1: Normal  OM2: Normal  RCA: 1-24% calcified plaque proximally, > 70% mostly soft plaque in mid vessel  PDA: Normal  PLA: Normal  IMPRESSION: 1. Calcium  score 420 which is 85 th percentile for age/sex LM/ 3 vessel calcium   2. CAD RADS 4 obstructive CAD in mid RCA and 50% LM Study sent for FFR CT  3.  Normal ascending thoracic aorta 2.8 cm  4.  Mitral annular calcification  Janelle Mediate   Electronically Signed By: Janelle Mediate M.D. On: 03/10/2022 12:28  Narrative EXAM: OVER-READ INTERPRETATION  CT CHEST  The following report is a limited chest CT over-read performed by radiologist Dr. Alexandria Angel of [facility] on 03/10/2022. The coronary calcium  score and cardiac CTA interpretation by the cardiologist is attached.  COMPARISON:  None Available.  FINDINGS: Atherosclerotic calcifications in the thoracic aorta. Within the  visualized portions of the thorax there are no suspicious appearing pulmonary nodules or masses, there is no acute consolidative airspace disease, no pleural effusions, no pneumothorax and no lymphadenopathy. Visualized portions of the upper abdomen are unremarkable. There are no aggressive appearing lytic or blastic lesions noted in the visualized portions of the skeleton. Bilateral breast implants are incidentally noted.  IMPRESSION: 1.  Aortic Atherosclerosis (ICD10-I70.0).  Electronically Signed: By: Alexandria Angel M.D. On: 03/10/2022 09:12     ______________________________________________________________________________________________     Recent Labs: 02/01/2024: ALT 15; BUN 18; Creatinine,  Ser 0.91; Hemoglobin 12.6; Platelets 127.0; Potassium 5.2 No hemolysis seen; Sodium 140; TSH 2.00  Recent Lipid Panel    Component Value Date/Time   CHOL 130 02/01/2024 0841   CHOL 131 04/04/2023 0000   TRIG 72.0 02/01/2024 0841   HDL 55.80 02/01/2024 0841   HDL 55 04/04/2023 0000   CHOLHDL 2 02/01/2024 0841   VLDL 14.4 02/01/2024 0841   LDLCALC 60 02/01/2024 0841   LDLCALC 58 04/04/2023 0000   LDLDIRECT 152.1 06/26/2013 0836    History of Present Illness    78 year old female with the above past medical history including CAD s/p DES-RCA in 2023, hyperlipidemia, prediabetes, GERD, clinical hypothyroidism, anxiety, depression, and venous insufficiency.   She was previously evaluated by cardiology in 2010. Nuclear study at the time showed EF 76%, no scar or ischemia.  She was referred by her PCP to Dr. Audery Blazing in May 2023 in the setting of chest pain concerning for angina. She reported a 1 year history of chest tightness with vigorous activity, associated back tightness. Coronary CTA revealed coronary calcium  score of 420, 85th percentile for age, sex, race matched control, FFR CT positive obstructive CAD and mid RCA. Cardiac catheterization in 03/2022 revealed a 30% OM1 lesion, 20% p-mLAD lesion, 90% p-mRCA lesion s/p DES, 20% o-pRCA lesion, EF 65%.  She was last seen in the office on 10/12/2022 and was doing well. She denied symptoms concerning for angina.   She presents today for follow-up. Since her last visit she has done well from a cardiac standpoint.  She denies symptoms concerning for angina.  She continues to work on weight loss efforts, however, she is concerned that she is also losing muscle mass. Otherwise, she reports feeling well.   Home Medications    Current Outpatient Medications  Medication Sig Dispense Refill   Apoaequorin (PREVAGEN PO) Take by mouth.     aspirin  81 MG tablet Take 81 mg by mouth daily.     Coenzyme Q10 (COQ-10 PO) Take by mouth daily.      Cyanocobalamin (VITAMIN B-12 PO) Take by mouth daily.     ezetimibe  (ZETIA ) 10 MG tablet TAKE 1 TABLET(10 MG) BY MOUTH DAILY 90 tablet 3   levothyroxine  (SYNTHROID ) 50 MCG tablet TAKE 1 TABLET BY MOUTH EVERY DAY 90 tablet 3   Magnesium 400 MG CAPS Take 400 mg by mouth daily.     metoprolol  succinate (TOPROL -XL) 25 MG 24 hr tablet TAKE 1 TABLET(25 MG) BY MOUTH DAILY 90 tablet 0   Multiple Vitamins-Minerals (ZINC PO) Take by mouth daily.     omeprazole  (PRILOSEC) 20 MG capsule TAKE 1 CAPSULE(20 MG) BY MOUTH TWICE DAILY PRN BEFORE A MEAL     rosuvastatin  (CRESTOR ) 20 MG tablet Take 1 tablet (20 mg total) by mouth daily. 30 tablet 0   No current facility-administered medications for this visit.     Review of Systems    She denies chest pain, palpitations, dyspnea,  pnd, orthopnea, n, v, dizziness, syncope, edema, weight gain, or early satiety. All other systems reviewed and are otherwise negative except as noted above.   Physical Exam    VS:  BP 138/72   Pulse (!) 57   Ht 5' 8 (1.727 m)   Wt 252 lb (114.3 kg)   SpO2 98%   BMI 38.32 kg/m  GEN: Well nourished, well developed, in no acute distress. HEENT: normal. Neck: Supple, no JVD, carotid bruits, or masses. Cardiac: RRR, no murmurs, rubs, or gallops. No clubbing, cyanosis, edema.  Radials/DP/PT 2+ and equal bilaterally.  Respiratory:  Respirations regular and unlabored, clear to auscultation bilaterally. GI: Soft, nontender, nondistended, BS + x 4. MS: no deformity or atrophy. Skin: warm and dry, no rash. Neuro:  Strength and sensation are intact. Psych: Normal affect.  Accessory Clinical Findings    ECG personally reviewed by me today - EKG Interpretation Date/Time:  Wednesday March 06 2024 08:11:11 EDT Ventricular Rate:  57 PR Interval:  174 QRS Duration:  64 QT Interval:  402 QTC Calculation: 391 R Axis:   23  Text Interpretation: Sinus bradycardia When compared with ECG of 05-Sep-2022 07:33, PREVIOUS ECG IS PRESENT  Confirmed by Marlana Silvan ([id number]) on 03/06/2024 8:50:43 AM  - no acute changes.   Lab Results  Component Value Date   WBC 4.5 02/01/2024   HGB 12.6 02/01/2024   HCT 37.5 02/01/2024   MCV 88.7 02/01/2024   PLT 127.0 (L) 02/01/2024   Lab Results  Component Value Date   CREATININE 0.91 02/01/2024   BUN 18 02/01/2024   NA 140 02/01/2024   K 5.2 No hemolysis seen (H) 02/01/2024   CL 104 02/01/2024   CO2 31 02/01/2024   Lab Results  Component Value Date   ALT 15 02/01/2024   AST 19 02/01/2024   ALKPHOS 51 02/01/2024   BILITOT 0.5 02/01/2024   Lab Results  Component Value Date   CHOL 130 02/01/2024   HDL 55.80 02/01/2024   LDLCALC 60 02/01/2024   LDLDIRECT 152.1 06/26/2013   TRIG 72.0 02/01/2024   CHOLHDL 2 02/01/2024    Lab Results  Component Value Date   HGBA1C 6.4 02/01/2024    Assessment & Plan    1. CAD/chest pain: S/p DES-p-mRCA. Stable with no anginal symptoms. No indication for ischemic evaluation. BP is mildly elevated in office today, generally well-controlled.  She has not had her medication yet this morning. Continue aspirin , metoprolol , Crestor , Zetia .   2. Hyperlipidemia: LDL was 60 in 01/2024.  Se has been working on weight loss efforts, however, she is concerned that she is also losing muscle mass.  She questions whether this could be a side effect of her rosuvastatin .  However, she declines alternative lipid-lowering therapy at this timen and would prefer to continue Crestor .  Continue Crestor , Zetia .   3. Prediabetes/obesity: A1c was 6.4 in 01/2024.Monitored and managed per PCP.  We discussed possible GLP-1 receptor agonist for weight loss, she will continue to think about this and either discuss with her PCP or notify us  if she would like to speak to our pharmacy team.    4. Disposition:  Follow-up in 1 year, sooner if needed.   Jude Norton, NP 03/08/2024, 12:34 PM

## 2024-03-06 NOTE — Patient Instructions (Signed)
 Medication Instructions:  Your physician recommends that you continue on your current medications as directed. Please refer to the Current Medication list given to you today.  *If you need a refill on your cardiac medications before your next appointment, please call your pharmacy*  Lab Work: NONE ordered at this time of appointment    Testing/Procedures: NONE ordered at this time of appointment    Follow-Up: At [facility], you and your health needs are our priority.  As part of our continuing mission to provide you with exceptional heart care, our providers are all part of one team.  This team includes your primary Cardiologist (physician) and Advanced Practice Providers or APPs (Physician Assistants and Nurse Practitioners) who all work together to provide you with the care you need, when you need it.  Your next appointment:   1 year(s)  Provider:   Alexandria Angel, MD    We recommend signing up for the patient portal called "MyChart".  Sign up information is provided on this After Visit Summary.  MyChart is used to connect with patients for Virtual Visits (Telemedicine).  Patients are able to view lab/test results, encounter notes, upcoming appointments, etc.  Non-urgent messages can be sent to your provider as well.   To learn more about what you can do with MyChart, go to [email or web address].

## 2024-03-08 ENCOUNTER — Encounter: Payer: Self-pay | Admitting: Nurse Practitioner

## 2024-03-19 ENCOUNTER — Other Ambulatory Visit: Payer: Self-pay

## 2024-03-19 DIAGNOSIS — E7800 Pure hypercholesterolemia, unspecified: Secondary | ICD-10-CM

## 2024-03-19 MED ORDER — ROSUVASTATIN CALCIUM 20 MG PO TABS: 20.0000 mg | ORAL_TABLET | Freq: Every day | ORAL | 3 refills | Status: AC

## 2024-04-12 ENCOUNTER — Other Ambulatory Visit: Payer: Self-pay | Admitting: Cardiovascular Disease

## 2024-04-19 ENCOUNTER — Other Ambulatory Visit: Payer: Self-pay | Admitting: Cardiology

## 2024-04-19 DIAGNOSIS — R072 Precordial pain: Secondary | ICD-10-CM

## 2024-06-25 DIAGNOSIS — H25813 Combined forms of age-related cataract, bilateral: Secondary | ICD-10-CM | POA: Diagnosis not present

## 2024-06-25 DIAGNOSIS — H353131 Nonexudative age-related macular degeneration, bilateral, early dry stage: Secondary | ICD-10-CM | POA: Diagnosis not present

## 2024-07-05 ENCOUNTER — Telehealth: Payer: Self-pay | Admitting: Pharmacist Clinician (PhC)/ Clinical Pharmacy Specialist

## 2024-07-05 ENCOUNTER — Ambulatory Visit: Admitting: Pharmacist Clinician (PhC)/ Clinical Pharmacy Specialist

## 2024-07-05 ENCOUNTER — Other Ambulatory Visit (HOSPITAL_COMMUNITY): Payer: Self-pay

## 2024-07-05 ENCOUNTER — Encounter: Payer: Self-pay | Admitting: Pharmacist Clinician (PhC)/ Clinical Pharmacy Specialist

## 2024-07-05 NOTE — Assessment & Plan Note (Addendum)
  Patient has not met goal of at least 5% of body weight loss with comprehensive lifestyle modifications alone in the past 3-6 months. Pharmacotherapy is appropriate to pursue as augmentation. Will start Mounjaro.   Diabetes diagnosis May 2024.  Answered all patient questions in regards to medications.  Advised patient on common side effects including nausea, diarrhea, dyspepsia, decreased appetite, and fatigue. Counseled patient on reducing meal size and how to titrate medication to minimize side effects. Patient aware to call if intolerable side effects or if experiencing dehydration, abdominal pain, or dizziness. Patient will adhere to dietary modifications and will target at least 150 minutes of moderate intensity exercise weekly, plus resistance training twice a week (as recommended by the American Heart Association). This resistance training--such as weightlifting, bodyweight exercises, or using resistance bands, adapted to the patient's ability--will help prevent muscle loss.  Injection technique reviewed at today's visit.    Follow up in 1-2 days regarding coverage of Mounjaro . If therapy is initiated, phone or MyChart follow-ups will be conducted every 4 weeks for dose titration until the patient reaches the effective therapeutic dose and target weight.

## 2024-07-05 NOTE — Telephone Encounter (Signed)
 Pharmacy Patient Advocate Encounter   Received notification from Physician's Office that prior authorization for MOUNJARO is required/requested.   Insurance verification completed.   The patient is insured through [facility] ADVANTAGE/RX ADVANCE.   Per test claim: PA required; PA submitted to above mentioned insurance via Latent Key/confirmation #/EOC [id number] Status is pending

## 2024-07-05 NOTE — Telephone Encounter (Signed)
 Please do PA for [facility].   DM dx

## 2024-07-05 NOTE — Progress Notes (Signed)
 Office Visit    Patient Name: [name] Date of Encounter: 07/05/2024  Primary Care Provider:  Geofm Glade PARAS, MD Primary Cardiologist:  Redell Shallow, MD  Chief Complaint    Weight management  Significant Past Medical History   CAD 2023 CAC 420 (85th percentile), DES to RCA  DM 11/24 A1c 7.4 (dx 01/2023)  HLD 5/25 LDL 60, on rosuvastatin , ezetimibe     Allergies  Allergen Reactions   Atorvastatin Other (See Comments)    REACTION: Upset stomach   Augmentin  [Amoxicillin -Pot Clavulanate] Diarrhea   Sertraline  Other (See Comments)    Tremor, loose stools    History of Present Illness    [name] is a 78 y.o. female patient of Dr Shallow, in the office today to discuss options for weight management.   She has dropped about 22 pounds in the last year, trying to monitor her diet and make healthier choices.  She is both hesitant and excited to try GLP's, has been hearing lots of comments on social media, and not sure what to believe.    Current weight management medications: none  Previously tried meds: none  Current meds that may affect weight: none  Baseline weight/BMI: 243.3 // 37  Insurance payor: [employer]  Diet: has been working to cut back on portion sizes,  snacking less  Exercise: some walking  Social History:   Tobacco: no  Alcohol: no   Accessory Clinical Findings    Lab Results  Component Value Date   CREATININE 0.91 02/01/2024   BUN 18 02/01/2024   NA 140 02/01/2024   K 5.2 No hemolysis seen (H) 02/01/2024   CL 104 02/01/2024   CO2 31 02/01/2024   Lab Results  Component Value Date   ALT 15 02/01/2024   AST 19 02/01/2024   ALKPHOS 51 02/01/2024   BILITOT 0.5 02/01/2024   Lab Results  Component Value Date   HGBA1C 6.4 02/01/2024      Home Medications/Allergies    Current Outpatient Medications  Medication Sig Dispense Refill   Apoaequorin (PREVAGEN PO) Take by mouth.     aspirin  81 MG tablet Take 81  mg by mouth daily.     Coenzyme Q10 (COQ-10 PO) Take by mouth daily.     Cyanocobalamin (VITAMIN B-12 PO) Take by mouth daily.     ezetimibe  (ZETIA ) 10 MG tablet TAKE 1 TABLET(10 MG) BY MOUTH DAILY 90 tablet 3   levothyroxine  (SYNTHROID ) 50 MCG tablet TAKE 1 TABLET BY MOUTH EVERY DAY 90 tablet 3   Magnesium 400 MG CAPS Take 400 mg by mouth daily.     metoprolol  succinate (TOPROL -XL) 25 MG 24 hr tablet TAKE 1 TABLET(25 MG) BY MOUTH DAILY 90 tablet 3   Multiple Vitamins-Minerals (ZINC PO) Take by mouth daily.     omeprazole  (PRILOSEC) 20 MG capsule TAKE 1 CAPSULE(20 MG) BY MOUTH TWICE DAILY PRN BEFORE A MEAL     rosuvastatin  (CRESTOR ) 20 MG tablet Take 1 tablet (20 mg total) by mouth daily. 90 tablet 3   No current facility-administered medications for this visit.     Allergies  Allergen Reactions   Atorvastatin Other (See Comments)    REACTION: Upset stomach   Augmentin  [Amoxicillin -Pot Clavulanate] Diarrhea   Sertraline  Other (See Comments)    Tremor, loose stools    Assessment & Plan    Morbid obesity (HCC)  Patient has not met goal of at least 5% of body weight loss with comprehensive lifestyle modifications  alone in the past 3-6 months. Pharmacotherapy is appropriate to pursue as augmentation. Will start Mounjaro.   Diabetes diagnosis May 2024  Advised patient on common side effects including nausea, diarrhea, dyspepsia, decreased appetite, and fatigue. Counseled patient on reducing meal size and how to titrate medication to minimize side effects. Patient aware to call if intolerable side effects or if experiencing dehydration, abdominal pain, or dizziness. Patient will adhere to dietary modifications and will target at least 150 minutes of moderate intensity exercise weekly, plus resistance training twice a week (as recommended by the American Heart Association). This resistance training--such as weightlifting, bodyweight exercises, or using resistance bands, adapted to the patient's  ability--will help prevent muscle loss.  Injection technique reviewed at today's visit.    Follow up in 1-2 days regarding coverage of Mounjaro . If therapy is initiated, phone or MyChart follow-ups will be conducted every 4 weeks for dose titration until the patient reaches the effective therapeutic dose and target weight.   Countess Biebel PharmD CPP [facility]  [address] [place], [place] [address] [phone]

## 2024-07-05 NOTE — Patient Instructions (Addendum)
 We will start the prior authorization process to get Mounjaro covered by your insurance.   TIPS FOR SUCCESS Write down the reasons why you want to lose weight and post it in a place where you'll see it often. Start small and work your way up. Keep in mind that it takes time to achieve goals, and small steps add up. Any additional movements help to burn calories. Taking the stairs rather than the elevator and parking at the far end of your parking lot are easy ways to start. Brisk walking for at least 30 minutes 4 or more days of the week is an excellent goal to work toward  [employer] WHAT IT MEANS TO FEEL FULL Did you know that it can take 15 minutes or more for your brain to receive the message that you've eaten? That means that, if you eat less food, but consume it slower, you may still feel satisfied. Eating a lot of fruits and vegetables can also help you feel fuller. Eat off of smaller plates so that moderate portions don't seem too small  TITRATION PLAN Will plan to follow the titration plan as below, pending patient is tolerating each dose before increasing to the next. Can slow titration if needed for tolerability.    Will start with Mounjaro 2.5 mg once weekly and increase by 2.5 mg every 4 weeks  Send me a [employer] when you take the 4th dose and let me know how you're doing.  Tell me about your exercise and dietary improvements and if you feel ready to increase to the next strength.  If you have any questions or concerns, please reach out to me  THANK YOU FOR CHOOSING [facility]

## 2024-07-05 NOTE — Telephone Encounter (Signed)
 Pharmacy Patient Advocate Encounter  Received notification from [facility] ADVANTAGE/RX ADVANCE that Prior Authorization for MOUNJARO has been APPROVED from 07/05/24 to 07/05/25. Ran test claim, Copay is $47. This test claim was processed through [facility]- copay amounts may vary at other pharmacies due to pharmacy/plan contracts, or as the patient moves through the different stages of their insurance plan.

## 2024-07-08 ENCOUNTER — Ambulatory Visit: Admitting: Pharmacist

## 2024-07-10 ENCOUNTER — Telehealth: Payer: Self-pay | Admitting: Cardiology

## 2024-07-10 NOTE — Telephone Encounter (Signed)
 Spoke with the patient who is wanting to know if Mounjaro had been approved and if so had it been sent in for her. Advised on information from previous phone note:   [name], [facility]  Pharmacy Patient Advocate Encounter   Received notification from [facility] [employer] that Prior Authorization for PAT has been APPROVED from 07/05/24 to 07/05/25. Ran test claim, Copay is $47. This test claim was processed through [facility]- copay amounts may vary at other pharmacies due to pharmacy/plan contracts, or as the patient moves through the different stages of their insurance plan.       Advised patient that prescription had not been sent in yet but that I would reach out to the PharmD team. She states that she would prefer to have the prescription send to [id number] - [place], [place] - [address]. However if it is going to cost much more there she is okay with using the [facility].

## 2024-07-10 NOTE — Telephone Encounter (Signed)
 Pt c/o medication issue:  1. Name of Medication: MOUNJARO   2. How are you currently taking this medication (dosage and times per day)? As written   3. Are you having a reaction (difficulty breathing--STAT)? no  4. What is your medication issue? Pt called in asking if this medication can be added to med list and called in. Please advise.

## 2024-07-11 ENCOUNTER — Other Ambulatory Visit (HOSPITAL_COMMUNITY): Payer: Self-pay

## 2024-07-11 MED ORDER — MOUNJARO 2.5 MG/0.5ML ~~LOC~~ SOAJ: 2.5000 mg | SUBCUTANEOUS | 0 refills | Status: DC

## 2024-07-11 MED ORDER — MOUNJARO 2.5 MG/0.5ML ~~LOC~~ SOAJ
2.5000 mg | SUBCUTANEOUS | 0 refills | Status: DC
  Filled 2024-07-11: qty 2, 28d supply, fill #0

## 2024-07-11 NOTE — Telephone Encounter (Signed)
 Pt calling to f/u on prescription for Mounjaro. Pt stated that she would like for medication to be sent to [facility] in [place]. Pt also requesting a c/b from nurse as to when medication will be called in. Please advise

## 2024-07-11 NOTE — Telephone Encounter (Signed)
 Tried to call to inform of [facility] sent to pharmacy in [place]- Voicemail full.

## 2024-07-11 NOTE — Addendum Note (Signed)
 Addended by: Taylin Mans K on: 07/11/2024 12:07 PM   Modules accepted: Orders

## 2024-07-24 DIAGNOSIS — H04123 Dry eye syndrome of bilateral lacrimal glands: Secondary | ICD-10-CM | POA: Diagnosis not present

## 2024-07-24 DIAGNOSIS — H2512 Age-related nuclear cataract, left eye: Secondary | ICD-10-CM | POA: Diagnosis not present

## 2024-07-24 DIAGNOSIS — I10 Essential (primary) hypertension: Secondary | ICD-10-CM | POA: Diagnosis not present

## 2024-07-24 DIAGNOSIS — H25013 Cortical age-related cataract, bilateral: Secondary | ICD-10-CM | POA: Diagnosis not present

## 2024-07-24 DIAGNOSIS — H25012 Cortical age-related cataract, left eye: Secondary | ICD-10-CM | POA: Diagnosis not present

## 2024-07-24 DIAGNOSIS — H2513 Age-related nuclear cataract, bilateral: Secondary | ICD-10-CM | POA: Diagnosis not present

## 2024-07-29 ENCOUNTER — Ambulatory Visit: Payer: Self-pay

## 2024-07-29 NOTE — Telephone Encounter (Signed)
 Patient requests to call to advise if she can take Miralax tonight for her constipation. States has been effective for her in the past.  Requested appt Wednesday instead of today or tomorrow as she will be in [place]. Advised to call for sooner visit if abdominal pain worsens, if she develops fever, nausea or vomiting prior to visit.  FYI Only or Action Required?: Action required by provider: clinical question for provider.  Patient was last seen in primary care on 02/06/2024 by Geofm Glade PARAS, MD.  Called Nurse Triage reporting Abdominal Pain.  Symptoms began several weeks ago.  Interventions attempted: OTC medications: miralax.  Symptoms are: unchanged.  Triage Disposition: See Physician Within 24 Hours  Patient/caregiver understands and will follow disposition?: Scheduled for 07/31/24  Reason for Disposition  [1] MODERATE pain (e.g., interferes with normal activities) AND [2] pain comes and goes (cramps) AND [3] present > 24 hours  (Exception: Pain with Vomiting or Diarrhea - see that Guideline.)  Answer Assessment - Initial Assessment Questions LLQ abdominal tenderness that began 07/28/24. Resolves when she passes gas.   Also reports intermittent diarrhea and constipation x 2 weeks and has been taking Miralax. Last BM was 07/27/24, her baseline is up to 4 days historically. Patient took most recent dose of Mounjaro this morning. Has been on it x 2 weeks. Prescribed by cardiologist and was told they can switch her to Zepbound to see if that helps.  Patient is asking if she can continue miralax and how much/how often.  1. LOCATION: Where does it hurt?      LLQ  2. RADIATION: Does the pain shoot anywhere else? (e.g., chest, back)     Denies  3. ONSET: When did the pain begin? (e.g., minutes, hours or days ago)      07/28/24  4. SUDDEN: Gradual or sudden onset?       5. PATTERN Does the pain come and go, or is it constant?     Comes and goes  6. SEVERITY: How bad  is the pain?  (e.g., Scale 1-10; mild, moderate, or severe)     6/10  7. RECURRENT SYMPTOM: Have you ever had this type of stomach pain before? If Yes, ask: When was the last time? and What happened that time?      States has been experiencing less frequent Bms since getting older, but worse recently   8. CAUSE: What do you think is causing the stomach pain? (e.g., gallstones, recent abdominal surgery)     Constipation and/or   9. RELIEVING/AGGRAVATING FACTORS: What makes it better or worse? (e.g., antacids, bending or twisting motion, bowel movement)     Passing gas or a bowel movement  10. OTHER SYMPTOMS: Do you have any other symptoms? (e.g., back pain, diarrhea, fever, urination pain, vomiting)       Denies  Protocols used: Abdominal Pain - [facility] Copied from CRM #[id number]. Topic: Clinical - Red Word Triage >> Jul 29, 2024  3:15 PM Eva FALCON wrote: Red Word that prompted transfer to Nurse Triage: Pt is experiencing discomfort, tenderness, and sharp stabbing pain in belly area. When having stabbing pain and rolls over to one side feels like a knife stabbing, but when passes gas it goes away. Unsure if its from diet or from exercise.

## 2024-07-30 ENCOUNTER — Encounter: Payer: Self-pay | Admitting: Internal Medicine

## 2024-07-30 NOTE — Progress Notes (Signed)
 Subjective:    Patient ID: Christopher Jenkins Pietro Smith, female    DOB: Feb 02, 1946, 78 y.o.   MRN: [id number]      HPI Loisann is here for  Chief Complaint  Patient presents with   Abdominal Pain   Constipation   Discussed the use of AI scribe software for clinical note transcription with the patient, who gave verbal consent to proceed.  History of Present Illness Ravenna Legore [name] Reizel Calzada is a 78 year old female with insulin resistance and fatty liver who presents with constipation and weight management concerns while on Mounjaro.  She started Mounjaro three weeks ago for weight management and has experienced weight loss. Despite concerns about potential side effects such as digestive issues, thyroid  tumors, cancer, and pancreatitis, she continues the medication due to its effectiveness in weight loss.  She has experienced constipation since starting Mounjaro, with her last bowel movement occurring last Friday. She feels 'blocked, backed up' and has not had a bowel movement despite taking Miralax. She experiences abdominal pain and tenderness, particularly in the lower abdomen.  No nausea, heartburn, or significant stomach pain aside from the bloating and soreness.  She is passing gas.  She is unsure what she should be taking.  She has changed her diet and is eating much healthier.  She is going to the gym and trying to eat lots of protein.  She is trying to drink a good amount of water .           Medications and allergies reviewed with patient and updated if appropriate.  Current Outpatient Medications on File Prior to Visit  Medication Sig Dispense Refill   Apoaequorin (PREVAGEN PO) Take by mouth.     aspirin  81 MG tablet Take 81 mg by mouth daily.     Coenzyme Q10 (COQ-10 PO) Take by mouth daily.     Cyanocobalamin (VITAMIN B-12 PO) Take by mouth daily.     ezetimibe  (ZETIA ) 10 MG tablet TAKE 1 TABLET(10 MG) BY MOUTH DAILY 90 tablet 3   levothyroxine   (SYNTHROID ) 50 MCG tablet TAKE 1 TABLET BY MOUTH EVERY DAY 90 tablet 3   Magnesium 400 MG CAPS Take 400 mg by mouth daily.     metoprolol  succinate (TOPROL -XL) 25 MG 24 hr tablet TAKE 1 TABLET(25 MG) BY MOUTH DAILY 90 tablet 3   Multiple Vitamins-Minerals (ZINC PO) Take by mouth daily.     omeprazole  (PRILOSEC) 20 MG capsule TAKE 1 CAPSULE(20 MG) BY MOUTH TWICE DAILY PRN BEFORE A MEAL     rosuvastatin  (CRESTOR ) 20 MG tablet Take 1 tablet (20 mg total) by mouth daily. 90 tablet 3   tirzepatide (MOUNJARO) 2.5 MG/0.5ML Pen Inject 2.5 mg into the skin once a week. 2 mL 0   No current facility-administered medications on file prior to visit.    Review of Systems  Constitutional:  Negative for fever.  Gastrointestinal:  Positive for abdominal distention, abdominal pain (LLQ soreness) and constipation. Negative for nausea.       No gerd       Objective:   Vitals:   07/31/24 0850  BP: 126/74  Pulse: 73  Temp: 98.6 F (37 C)  SpO2: 98%   BP Readings from Last 3 Encounters:  07/31/24 126/74  03/06/24 138/72  02/06/24 120/78   Wt Readings from Last 3 Encounters:  07/31/24 232 lb (105.2 kg)  07/05/24 243 lb 4.8 oz (110.4 kg)  03/06/24 252 lb (114.3 kg)   Body mass index is  35.28 kg/m.    Physical Exam Constitutional:      General: She is not in acute distress.    Appearance: She is well-developed. She is not ill-appearing.  Abdominal:     General: Bowel sounds are normal.     Palpations: Abdomen is soft. There is no mass.     Tenderness: There is abdominal tenderness in the right lower quadrant, suprapubic area and left lower quadrant.     Hernia: No hernia is present.  Skin:    General: Skin is warm and dry.     Findings: No rash.  Neurological:     Mental Status: She is alert.            Assessment & Plan:    See Problem List for Assessment and Plan of chronic medical problems.

## 2024-07-31 ENCOUNTER — Ambulatory Visit (INDEPENDENT_AMBULATORY_CARE_PROVIDER_SITE_OTHER): Admitting: Internal Medicine

## 2024-07-31 VITALS — BP 126/74 | HR 73 | Temp 98.6°F | Ht 68.0 in | Wt 232.0 lb

## 2024-07-31 DIAGNOSIS — N1831 Chronic kidney disease, stage 3a: Secondary | ICD-10-CM

## 2024-07-31 DIAGNOSIS — L570 Actinic keratosis: Secondary | ICD-10-CM | POA: Diagnosis not present

## 2024-07-31 DIAGNOSIS — I2510 Atherosclerotic heart disease of native coronary artery without angina pectoris: Secondary | ICD-10-CM

## 2024-07-31 DIAGNOSIS — E039 Hypothyroidism, unspecified: Secondary | ICD-10-CM

## 2024-07-31 DIAGNOSIS — L821 Other seborrheic keratosis: Secondary | ICD-10-CM | POA: Diagnosis not present

## 2024-07-31 DIAGNOSIS — K5903 Drug induced constipation: Secondary | ICD-10-CM | POA: Diagnosis not present

## 2024-07-31 DIAGNOSIS — E785 Hyperlipidemia, unspecified: Secondary | ICD-10-CM | POA: Diagnosis not present

## 2024-07-31 DIAGNOSIS — Z7985 Long-term (current) use of injectable non-insulin antidiabetic drugs: Secondary | ICD-10-CM

## 2024-07-31 DIAGNOSIS — K5900 Constipation, unspecified: Secondary | ICD-10-CM | POA: Insufficient documentation

## 2024-07-31 DIAGNOSIS — E1169 Type 2 diabetes mellitus with other specified complication: Secondary | ICD-10-CM

## 2024-07-31 DIAGNOSIS — E7800 Pure hypercholesterolemia, unspecified: Secondary | ICD-10-CM | POA: Diagnosis not present

## 2024-07-31 DIAGNOSIS — D696 Thrombocytopenia, unspecified: Secondary | ICD-10-CM

## 2024-07-31 DIAGNOSIS — E1122 Type 2 diabetes mellitus with diabetic chronic kidney disease: Secondary | ICD-10-CM

## 2024-07-31 DIAGNOSIS — Z85828 Personal history of other malignant neoplasm of skin: Secondary | ICD-10-CM | POA: Diagnosis not present

## 2024-07-31 DIAGNOSIS — Z419 Encounter for procedure for purposes other than remedying health state, unspecified: Secondary | ICD-10-CM | POA: Diagnosis not present

## 2024-07-31 LAB — CBC WITH DIFFERENTIAL/PLATELET
Basophils Absolute: 0 K/uL (ref 0.0–0.1)
Basophils Relative: 0.6 % (ref 0.0–3.0)
Eosinophils Absolute: 0.1 K/uL (ref 0.0–0.7)
Eosinophils Relative: 1.4 % (ref 0.0–5.0)
HCT: 38.5 % (ref 36.0–46.0)
Hemoglobin: 12.8 g/dL (ref 12.0–15.0)
Lymphocytes Relative: 23.9 % (ref 12.0–46.0)
Lymphs Abs: 1.7 K/uL (ref 0.7–4.0)
MCHC: 33.3 g/dL (ref 30.0–36.0)
MCV: 86.8 fl (ref 78.0–100.0)
Monocytes Absolute: 0.6 K/uL (ref 0.1–1.0)
Monocytes Relative: 9 % (ref 3.0–12.0)
Neutro Abs: 4.5 K/uL (ref 1.4–7.7)
Neutrophils Relative %: 65.1 % (ref 43.0–77.0)
Platelets: 153 K/uL (ref 150.0–400.0)
RBC: 4.44 Mil/uL (ref 3.87–5.11)
RDW: 14 % (ref 11.5–15.5)
WBC: 6.9 K/uL (ref 4.0–10.5)

## 2024-07-31 LAB — LIPID PANEL
Cholesterol: 115 mg/dL (ref 0–200)
HDL: 49.6 mg/dL (ref >39.00)
LDL Cholesterol: 47 mg/dL (ref 0–99)
NonHDL: 65
Total CHOL/HDL Ratio: 2
Triglycerides: 92 mg/dL (ref 0.0–149.0)
VLDL: 18.4 mg/dL (ref 0.0–40.0)

## 2024-07-31 LAB — COMPREHENSIVE METABOLIC PANEL WITH GFR
ALT: 17 U/L (ref 0–35)
AST: 24 U/L (ref 0–37)
Albumin: 3.9 g/dL (ref 3.5–5.2)
Alkaline Phosphatase: 63 U/L (ref 39–117)
BUN: 17 mg/dL (ref 6–23)
CO2: 30 meq/L (ref 19–32)
Calcium: 9.1 mg/dL (ref 8.4–10.5)
Chloride: 100 meq/L (ref 96–112)
Creatinine, Ser: 0.93 mg/dL (ref 0.40–1.20)
GFR: 59.57 mL/min — ABNORMAL LOW (ref >60.00)
Glucose, Bld: 98 mg/dL (ref 70–99)
Potassium: 4.5 meq/L (ref 3.5–5.1)
Sodium: 138 meq/L (ref 135–145)
Total Bilirubin: 0.6 mg/dL (ref 0.2–1.2)
Total Protein: 7.3 g/dL (ref 6.0–8.3)

## 2024-07-31 LAB — MICROALBUMIN / CREATININE URINE RATIO
Creatinine,U: 185.8 mg/dL
Microalb Creat Ratio: 9.4 mg/g (ref 0.0–30.0)
Microalb, Ur: 1.7 mg/dL (ref 0.0–1.9)

## 2024-07-31 LAB — HEMOGLOBIN A1C: Hgb A1c MFr Bld: 5.9 % (ref 4.6–6.5)

## 2024-07-31 NOTE — Assessment & Plan Note (Signed)
 Chronic Morbid obesity with BMI 35.28 with comorbidities of diabetes, CAD, hyperlipidemia Placed on Mounjaro 3 weeks ago 2.5 mg weekly and has seen some weight loss Stressed regular exercise, increase protein in her diet, which she is doing

## 2024-07-31 NOTE — Patient Instructions (Addendum)
      Blood work was ordered.      For the constipation you can try -   Colace or Docusate 1-3 pills a day Probiotics Metamucil fiber miralax - which you can take daily if needed Smooth move tea Prunes, prune juice, dates or raisons Magnesium supplement over the counter    Return for follow up as scheduled.

## 2024-07-31 NOTE — Assessment & Plan Note (Signed)
 Chronic Associated with CKD 3a Sugars currently controlled  Lab Results  Component Value Date   HGBA1C 6.4 02/01/2024   Low sugar / carb diet  Stressed regular exercise Encouraged weight loss Started on Mounjaro 3 weeks ago and has lost some weight

## 2024-07-31 NOTE — Assessment & Plan Note (Signed)
 Chronic  Clinically euthyroid Continue taking levothyroxine  50 mcg daily Discussed that as she continues to lose weight we will need to adjust her medication dose Check TSH  Lab Results  Component Value Date   TSH 2.00 02/01/2024

## 2024-07-31 NOTE — Assessment & Plan Note (Signed)
 Chronic GFR has improved  Does not takes nsaids Continue increased fluids Currently on Mounjaro which should help with her weight loss

## 2024-07-31 NOTE — Assessment & Plan Note (Signed)
 Acute Secondary to Mounjaro which she started 3 weeks ago Reviewed how the medication works and potential side effects She is experiencing constipation, but no other side effects She has seen some weight loss  Advised starting MiraLAX and daily.  Discussed that she may also need to take a stool softener on a daily basis Discussed that she can eat some prunes, drink prune juice and other ways of helping with the constipation Encouraged her not to use laxatives on a regular basis-needs to prevent the constipation If she is not seeing improvement she will let me know Encouraged her to stay on the lower dose of Mounjaro in order to have less side effects

## 2024-08-01 LAB — TSH: TSH: 2.54 u[IU]/mL (ref 0.35–5.50)

## 2024-08-02 ENCOUNTER — Ambulatory Visit: Payer: Self-pay | Admitting: Internal Medicine

## 2024-08-08 ENCOUNTER — Telehealth: Payer: Self-pay | Admitting: Cardiology

## 2024-08-08 ENCOUNTER — Other Ambulatory Visit: Payer: Self-pay | Admitting: Cardiology

## 2024-08-08 NOTE — Telephone Encounter (Signed)
*  STAT* If patient is at the pharmacy, call can be transferred to refill team.   1. Which medications need to be refilled? (please list name of each medication and dose if known)  tirzepatide (MOUNJARO) 2.5 MG/0.5ML Pen Inject 2.5 mg into the skin once a week.     2. Which pharmacy/location (including street and city if local pharmacy) is medication to be sent to? [id number] - [place], [place] - [address]   3. Do they need a 30 day or 90 day supply? 90

## 2024-08-09 NOTE — Telephone Encounter (Signed)
 Call to see if patient ready to move on the next dose. N/A LVM

## 2024-08-12 ENCOUNTER — Encounter: Payer: Self-pay | Admitting: Internal Medicine

## 2024-08-12 NOTE — Progress Notes (Unsigned)
 Subjective:    Patient ID: [name], female    DOB: 11-05-1945, 78 y.o.   MRN: [id number]      HPI [name] is here for a Physical exam and her chronic medical problems.    Had blood work done.   bmi  Medications and allergies reviewed with patient and updated if appropriate.  Current Outpatient Medications on File Prior to Visit  Medication Sig Dispense Refill   Apoaequorin (PREVAGEN PO) Take by mouth.     aspirin  81 MG tablet Take 81 mg by mouth daily.     Coenzyme Q10 (COQ-10 PO) Take by mouth daily.     Cyanocobalamin (VITAMIN B-12 PO) Take by mouth daily.     ezetimibe  (ZETIA ) 10 MG tablet TAKE 1 TABLET(10 MG) BY MOUTH DAILY 90 tablet 3   levothyroxine  (SYNTHROID ) 50 MCG tablet TAKE 1 TABLET BY MOUTH EVERY DAY 90 tablet 3   Magnesium 400 MG CAPS Take 400 mg by mouth daily.     metoprolol  succinate (TOPROL -XL) 25 MG 24 hr tablet TAKE 1 TABLET(25 MG) BY MOUTH DAILY 90 tablet 3   MOUNJARO 2.5 MG/0.5ML Pen ADMINISTER 2.5 MG UNDER THE SKIN 1 TIME A WEEK 2 mL 0   Multiple Vitamins-Minerals (ZINC PO) Take by mouth daily.     omeprazole  (PRILOSEC) 20 MG capsule TAKE 1 CAPSULE(20 MG) BY MOUTH TWICE DAILY PRN BEFORE A MEAL     rosuvastatin  (CRESTOR ) 20 MG tablet Take 1 tablet (20 mg total) by mouth daily. 90 tablet 3   No current facility-administered medications on file prior to visit.    Review of Systems     Objective:  There were no vitals filed for this visit. There were no vitals filed for this visit. There is no height or weight on file to calculate BMI.  BP Readings from Last 3 Encounters:  07/31/24 126/74  03/06/24 138/72  02/06/24 120/78    Wt Readings from Last 3 Encounters:  07/31/24 232 lb (105.2 kg)  07/05/24 243 lb 4.8 oz (110.4 kg)  03/06/24 252 lb (114.3 kg)       Physical Exam Constitutional: She appears well-developed and well-nourished. No distress.  HENT:  Head: Normocephalic and atraumatic.  Right Ear: External ear  normal. Normal ear canal and TM Left Ear: External ear normal.  Normal ear canal and TM Mouth/Throat: Oropharynx is clear and moist.  Eyes: Conjunctivae normal.  Neck: Neck supple. No tracheal deviation present. No thyromegaly present.  No carotid bruit  Cardiovascular: Normal rate, regular rhythm and normal heart sounds.   No murmur heard.  No edema. Pulmonary/Chest: Effort normal and breath sounds normal. No respiratory distress. She has no wheezes. She has no rales.  Breast: deferred   Abdominal: Soft. She exhibits no distension. There is no tenderness.  Lymphadenopathy: She has no cervical adenopathy.  Skin: Skin is warm and dry. She is not diaphoretic.  Psychiatric: She has a normal mood and affect. Her behavior is normal.     Lab Results  Component Value Date   WBC 6.9 07/31/2024   HGB 12.8 07/31/2024   HCT 38.5 07/31/2024   PLT 153.0 07/31/2024   GLUCOSE 98 07/31/2024   CHOL 115 07/31/2024   TRIG 92.0 07/31/2024   HDL 49.60 07/31/2024   LDLDIRECT 152.1 06/26/2013   LDLCALC 47 07/31/2024   ALT 17 07/31/2024   AST 24 07/31/2024   NA 138 07/31/2024   K 4.5 07/31/2024   CL 100 07/31/2024   CREATININE  0.93 07/31/2024   BUN 17 07/31/2024   CO2 30 07/31/2024   TSH 2.54 07/31/2024   HGBA1C 5.9 07/31/2024   MICROALBUR 1.7 07/31/2024         Assessment & Plan:   Physical exam: Screening blood work  ordered Exercise   Weight   Substance abuse  none   Reviewed recommended immunizations.   Health Maintenance  Topic Date Due   OPHTHALMOLOGY EXAM  Never done   Zoster Vaccines- Shingrix (1 of 2) Never done   Influenza Vaccine  04/26/2024   COVID-19 Vaccine (3 - 2025-26 season) 05/27/2024   FOOT EXAM  08/07/2024   DTaP/Tdap/Td (1 - Tdap) 02/05/2025 (Originally 10/01/1966)   HEMOGLOBIN A1C  01/28/2025   Medicare Annual Wellness (AWV)  02/28/2025   Diabetic kidney evaluation - eGFR measurement  07/31/2025   Diabetic kidney evaluation - Urine ACR  07/31/2025    Mammogram  12/24/2025   DEXA SCAN  09/29/2027   Pneumococcal Vaccine: 50+ Years  Completed   Hepatitis C Screening  Completed   Meningococcal B Vaccine  Aged Out   Colonoscopy  Discontinued          See Problem List for Assessment and Plan of chronic medical problems.

## 2024-08-12 NOTE — Patient Instructions (Addendum)
 Medications changes include :   None    A referral was ordered and someone will call you to schedule an appointment.     Return in about 6 months (around 02/10/2025) for follow up.    Health Maintenance, Female Adopting a healthy lifestyle and getting preventive care are important in promoting health and wellness. Ask your health care provider about: The right schedule for you to have regular tests and exams. Things you can do on your own to prevent diseases and keep yourself healthy. What should I know about diet, weight, and exercise? Eat a healthy diet  Eat a diet that includes plenty of vegetables, fruits, low-fat dairy products, and lean protein. Do not eat a lot of foods that are high in solid fats, added sugars, or sodium. Maintain a healthy weight Body mass index (BMI) is used to identify weight problems. It estimates body fat based on height and weight. Your health care provider can help determine your BMI and help you achieve or maintain a healthy weight. Get regular exercise Get regular exercise. This is one of the most important things you can do for your health. Most adults should: Exercise for at least 150 minutes each week. The exercise should increase your heart rate and make you sweat (moderate-intensity exercise). Do strengthening exercises at least twice a week. This is in addition to the moderate-intensity exercise. Spend less time sitting. Even light physical activity can be beneficial. Watch cholesterol and blood lipids Have your blood tested for lipids and cholesterol at 76 years of age, then have this test every 5 years. Have your cholesterol levels checked more often if: Your lipid or cholesterol levels are high. You are older than 76 years of age. You are at high risk for heart disease. What should I know about cancer screening? Depending on your health history and family history, you may need to have cancer screening at various ages. This may  include screening for: Breast cancer. Cervical cancer. Colorectal cancer. Skin cancer. Lung cancer. What should I know about heart disease, diabetes, and high blood pressure? Blood pressure and heart disease High blood pressure causes heart disease and increases the risk of stroke. This is more likely to develop in people who have high blood pressure readings or are overweight. Have your blood pressure checked: Every 3-5 years if you are 68-82 years of age. Every year if you are 52 years old or older. Diabetes Have regular diabetes screenings. This checks your fasting blood sugar level. Have the screening done: Once every three years after age 41 if you are at a normal weight and have a low risk for diabetes. More often and at a younger age if you are overweight or have a high risk for diabetes. What should I know about preventing infection? Hepatitis B If you have a higher risk for hepatitis B, you should be screened for this virus. Talk with your health care provider to find out if you are at risk for hepatitis B infection. Hepatitis C Testing is recommended for: Everyone born from [birth year] through 1965. Anyone with known risk factors for hepatitis C. Sexually transmitted infections (STIs) Get screened for STIs, including gonorrhea and chlamydia, if: You are sexually active and are younger than 76 years of age. You are older than 76 years of age and your health care provider tells you that you are at risk for this type of infection. Your sexual activity has changed since you were last screened, and you are  at increased risk for chlamydia or gonorrhea. Ask your health care provider if you are at risk. Ask your health care provider about whether you are at high risk for HIV. Your health care provider may recommend a prescription medicine to help prevent HIV infection. If you choose to take medicine to prevent HIV, you should first get tested for HIV. You should then be tested every 3 months  for as long as you are taking the medicine. Pregnancy If you are about to stop having your period (premenopausal) and you may become pregnant, seek counseling before you get pregnant. Take 400 to 800 micrograms (mcg) of folic acid every day if you become pregnant. Ask for birth control (contraception) if you want to prevent pregnancy. Osteoporosis and menopause Osteoporosis is a disease in which the bones lose minerals and strength with aging. This can result in bone fractures. If you are 80 years old or older, or if you are at risk for osteoporosis and fractures, ask your health care provider if you should: Be screened for bone loss. Take a calcium  or vitamin D supplement to lower your risk of fractures. Be given hormone replacement therapy (HRT) to treat symptoms of menopause. Follow these instructions at home: Alcohol use Do not drink alcohol if: Your health care provider tells you not to drink. You are pregnant, may be pregnant, or are planning to become pregnant. If you drink alcohol: Limit how much you have to: 0-1 drink a day. Know how much alcohol is in your drink. In the [place] one drink equals one 12 oz bottle of beer (355 mL), one 5 oz glass of wine (148 mL), or one 1 oz glass of hard liquor (44 mL). Lifestyle Do not use any products that contain nicotine or tobacco. These products include cigarettes, chewing tobacco, and vaping devices, such as e-cigarettes. If you need help quitting, ask your health care provider. Do not use street drugs. Do not share needles. Ask your health care provider for help if you need support or information about quitting drugs. General instructions Schedule regular health, dental, and eye exams. Stay current with your vaccines. Tell your health care provider if: You often feel depressed. You have ever been abused or do not feel safe at home. Summary Adopting a healthy lifestyle and getting preventive care are important in promoting health and  wellness. Follow your health care provider's instructions about healthy diet, exercising, and getting tested or screened for diseases. Follow your health care provider's instructions on monitoring your cholesterol and blood pressure. This information is not intended to replace advice given to you by your health care provider. Make sure you discuss any questions you have with your health care provider. Document Revised: 02/01/2021 Document Reviewed: 02/01/2021 Elsevier Patient Education  2024 [employer].

## 2024-08-13 ENCOUNTER — Ambulatory Visit (INDEPENDENT_AMBULATORY_CARE_PROVIDER_SITE_OTHER): Admitting: Internal Medicine

## 2024-08-13 VITALS — BP 120/74 | HR 59 | Temp 98.1°F | Ht 68.0 in | Wt 232.0 lb

## 2024-08-13 DIAGNOSIS — Z0000 Encounter for general adult medical examination without abnormal findings: Secondary | ICD-10-CM

## 2024-08-13 DIAGNOSIS — D696 Thrombocytopenia, unspecified: Secondary | ICD-10-CM

## 2024-08-13 DIAGNOSIS — E1122 Type 2 diabetes mellitus with diabetic chronic kidney disease: Secondary | ICD-10-CM | POA: Diagnosis not present

## 2024-08-13 DIAGNOSIS — N1831 Chronic kidney disease, stage 3a: Secondary | ICD-10-CM

## 2024-08-13 DIAGNOSIS — I2510 Atherosclerotic heart disease of native coronary artery without angina pectoris: Secondary | ICD-10-CM | POA: Diagnosis not present

## 2024-08-13 DIAGNOSIS — E039 Hypothyroidism, unspecified: Secondary | ICD-10-CM | POA: Diagnosis not present

## 2024-08-13 DIAGNOSIS — K219 Gastro-esophageal reflux disease without esophagitis: Secondary | ICD-10-CM | POA: Diagnosis not present

## 2024-08-13 DIAGNOSIS — E785 Hyperlipidemia, unspecified: Secondary | ICD-10-CM | POA: Diagnosis not present

## 2024-08-13 DIAGNOSIS — K5903 Drug induced constipation: Secondary | ICD-10-CM | POA: Diagnosis not present

## 2024-08-13 DIAGNOSIS — R600 Localized edema: Secondary | ICD-10-CM | POA: Diagnosis not present

## 2024-08-13 DIAGNOSIS — E1169 Type 2 diabetes mellitus with other specified complication: Secondary | ICD-10-CM

## 2024-08-13 MED ORDER — OMEPRAZOLE 20 MG PO CPDR: 20.0000 mg | DELAYED_RELEASE_CAPSULE | Freq: Every day | ORAL | Status: AC

## 2024-08-13 NOTE — Assessment & Plan Note (Signed)
 Chronic GERD controlled Continue omeprazole  20 mg daily  - taking it every other day

## 2024-08-13 NOTE — Assessment & Plan Note (Signed)
 Chronic Intermittent  Mild Uses compression socks in cooler weather Advise low-sodium diet, compression socks, elevating legs, exercise, weight loss

## 2024-08-13 NOTE — Assessment & Plan Note (Signed)
 Chronic S/p PCI On asa 81 mg, crestor  20 mg, zetia , metoprolol  No symptoms c/w angina Advised regular exercise, healthy diet On mounjaro

## 2024-08-13 NOTE — Assessment & Plan Note (Signed)
 Chronic Stage 3a, mild and stable Does not takes nsaids Continue increased fluids Currently on Mounjaro which should help with her weight loss

## 2024-08-13 NOTE — Assessment & Plan Note (Addendum)
 Chronic With comorbidities of diabetes, CAD, hyperlipidemia BMI was 35.28  Currently on Mounjaro 2.5 mg and has seen some weight loss - dose management per cardio Stressed regular exercise, increase protein in her diet, which she is doing

## 2024-08-13 NOTE — Assessment & Plan Note (Signed)
 Chronic Improved and now normal Weight loss is likely helping

## 2024-08-13 NOTE — Assessment & Plan Note (Signed)
 Chronic  Clinically euthyroid TSH in normal range Continue levothyroxine  50 mcg daily  Lab Results  Component Value Date   TSH 2.54 07/31/2024

## 2024-08-13 NOTE — Assessment & Plan Note (Signed)
 Chronic Associated with CKD 3a Sugars well controlled  Lab Results  Component Value Date   HGBA1C 5.9 07/31/2024   Low sugar / carb diet  Stressed regular exercise Encouraged weight loss Continue mounjaro

## 2024-08-13 NOTE — Assessment & Plan Note (Signed)
 Chronic Trying to manage with diet, increase water  intake and exercise Has used warm prune juice as needed and that has been very effective Has not needed use of MiraLAX

## 2024-08-13 NOTE — Assessment & Plan Note (Signed)
 Chronic Lab Results  Component Value Date   LDLCALC 47 07/31/2024   LDL at goal Continue crestor  20 mg daily, zetia  10 mg daily Regular exercise and healthy diet encouraged

## 2024-08-14 DIAGNOSIS — H2513 Age-related nuclear cataract, bilateral: Secondary | ICD-10-CM | POA: Diagnosis not present

## 2024-08-14 DIAGNOSIS — H2511 Age-related nuclear cataract, right eye: Secondary | ICD-10-CM | POA: Diagnosis not present

## 2024-08-14 DIAGNOSIS — H02882 Meibomian gland dysfunction right lower eyelid: Secondary | ICD-10-CM | POA: Diagnosis not present

## 2024-08-14 DIAGNOSIS — H02885 Meibomian gland dysfunction left lower eyelid: Secondary | ICD-10-CM | POA: Diagnosis not present

## 2024-08-16 ENCOUNTER — Telehealth: Payer: Self-pay | Admitting: Pharmacist

## 2024-08-16 NOTE — Telephone Encounter (Signed)
 Received call from [facility]. Patient will be having back to back cataract surgery and requested for patient to hold Mounjaro  for 2 weeks. Advised that was fine.

## 2024-08-20 ENCOUNTER — Encounter: Payer: Self-pay | Admitting: Internal Medicine

## 2024-08-20 NOTE — Progress Notes (Signed)
 Pharmacy Quality Measure Review  This patient is appearing on a report for being at risk of failing the adherence measure for diabetes medications this calendar year.   Medication: Mounjaro  2.5mg  Last fill date: 11/14 for 28 day supply  Insurance report was not up to date. No action needed at this time.   Angela Baalmann, PharmD [facility] Pharmacist

## 2024-08-27 DIAGNOSIS — H2511 Age-related nuclear cataract, right eye: Secondary | ICD-10-CM | POA: Diagnosis not present

## 2024-08-27 DIAGNOSIS — H2512 Age-related nuclear cataract, left eye: Secondary | ICD-10-CM | POA: Diagnosis not present

## 2024-08-27 DIAGNOSIS — H25011 Cortical age-related cataract, right eye: Secondary | ICD-10-CM | POA: Diagnosis not present

## 2024-08-27 DIAGNOSIS — Z955 Presence of coronary angioplasty implant and graft: Secondary | ICD-10-CM | POA: Diagnosis not present

## 2024-08-27 DIAGNOSIS — H25012 Cortical age-related cataract, left eye: Secondary | ICD-10-CM | POA: Diagnosis not present

## 2024-09-16 ENCOUNTER — Telehealth: Payer: Self-pay | Admitting: Pharmacist Clinician (PhC)/ Clinical Pharmacy Specialist

## 2024-09-16 NOTE — Telephone Encounter (Signed)
" °*  STAT* If patient is at the pharmacy, call can be transferred to refill team.   1. Which medications need to be refilled? (please list name of each medication and dose if known)   MOUNJARO  2.5 MG/0.5ML Pen     2. Would you like to learn more about the convenience, safety, & potential cost savings by using the [facility]?   No   3. Are you open to using the [facility] (Type [facility]. )   4. Which pharmacy/location (including street and city if local pharmacy) is medication to be sent to? [id number] - [place], [place] - [address]     5. Do they need a 30 day or 90 day supply? 30  "

## 2024-09-17 MED ORDER — MOUNJARO 2.5 MG/0.5ML ~~LOC~~ SOAJ: 2.5000 mg | SUBCUTANEOUS | 0 refills | Status: DC

## 2024-10-17 ENCOUNTER — Other Ambulatory Visit: Payer: Self-pay | Admitting: Cardiology

## 2024-10-18 ENCOUNTER — Telehealth: Payer: Self-pay | Admitting: Cardiology

## 2024-10-18 NOTE — Telephone Encounter (Signed)
" °*  STAT* If patient is at the pharmacy, call can be transferred to refill team.   1. Which medications need to be refilled? (please list name of each medication and dose if known)   tirzepatide  (MOUNJARO ) 2.5 MG/0.5ML Pen    2. Which pharmacy/location (including street and city if local pharmacy) is medication to be sent to? [id number] - [place], [place] - [address]   3. Do they need a 30 day or 90 day supply? 90  "

## 2024-10-21 MED ORDER — MOUNJARO 2.5 MG/0.5ML ~~LOC~~ SOAJ: 2.5000 mg | SUBCUTANEOUS | 0 refills | Status: AC

## 2025-02-11 ENCOUNTER — Ambulatory Visit: Admitting: Internal Medicine

## 2025-03-04 ENCOUNTER — Ambulatory Visit
